# Patient Record
Sex: Male | Born: 1935
Health system: Southern US, Community
[De-identification: ages and names within clinical notes are randomized; demographics above are authoritative.]

## PROBLEM LIST (undated history)

## (undated) DIAGNOSIS — D472 Monoclonal gammopathy: Principal | ICD-10-CM

## (undated) DIAGNOSIS — E785 Hyperlipidemia, unspecified: Secondary | ICD-10-CM

## (undated) DIAGNOSIS — N1832 Chronic kidney disease, stage 3b: Secondary | ICD-10-CM

## (undated) DIAGNOSIS — I4510 Unspecified right bundle-branch block: Secondary | ICD-10-CM

## (undated) DIAGNOSIS — E669 Obesity, unspecified: Secondary | ICD-10-CM

## (undated) DIAGNOSIS — K222 Esophageal obstruction: Secondary | ICD-10-CM

## (undated) DIAGNOSIS — I2510 Atherosclerotic heart disease of native coronary artery without angina pectoris: Secondary | ICD-10-CM

## (undated) DIAGNOSIS — F32A Depression, unspecified: Secondary | ICD-10-CM

## (undated) DIAGNOSIS — R002 Palpitations: Secondary | ICD-10-CM

## (undated) DIAGNOSIS — E1169 Type 2 diabetes mellitus with other specified complication: Secondary | ICD-10-CM

## (undated) DIAGNOSIS — C801 Malignant (primary) neoplasm, unspecified: Secondary | ICD-10-CM

## (undated) HISTORY — PX: APPENDECTOMY: SHX54

## (undated) HISTORY — PX: HERNIA REPAIR: SHX51

## (undated) HISTORY — PX: CATARACT EXTRACTION W/ INTRAOCULAR LENS  IMPLANT, BILATERAL: SHX1307

## (undated) HISTORY — DX: Obesity, unspecified: E66.9

## (undated) HISTORY — DX: Palpitations: R00.2

## (undated) HISTORY — PX: CARDIAC CATHETERIZATION: SHX172

## (undated) HISTORY — DX: Hyperlipidemia, unspecified: E78.5

## (undated) HISTORY — DX: Malignant (primary) neoplasm, unspecified: C80.1

## (undated) HISTORY — DX: Esophageal obstruction: K22.2

## (undated) HISTORY — DX: Obesity, unspecified: E11.69

## (undated) HISTORY — DX: Atherosclerotic heart disease of native coronary artery without angina pectoris: I25.10

## (undated) HISTORY — PX: OTHER SURGICAL HISTORY: SHX169

## (undated) HISTORY — DX: Monoclonal gammopathy: D47.2

## (undated) HISTORY — DX: Unspecified right bundle-branch block: I45.10

## (undated) HISTORY — PX: ANKLE SURGERY: SHX546

---

## 2002-09-10 DIAGNOSIS — C801 Malignant (primary) neoplasm, unspecified: Secondary | ICD-10-CM

## 2002-09-10 HISTORY — DX: Malignant (primary) neoplasm, unspecified: C80.1

## 2002-09-10 HISTORY — PX: PROSTATECTOMY: SHX69

## 2007-06-11 HISTORY — PX: CORONARY ARTERY BYPASS GRAFT: SHX141

## 2007-06-30 ENCOUNTER — Inpatient Hospital Stay (HOSPITAL_COMMUNITY): Admission: EM | Admit: 2007-06-30 | Discharge: 2007-07-07 | Payer: Self-pay | Admitting: Emergency Medicine

## 2007-07-01 ENCOUNTER — Ambulatory Visit: Payer: Self-pay | Admitting: Surgery

## 2007-07-01 ENCOUNTER — Encounter: Payer: Self-pay | Admitting: Cardiothoracic Surgery

## 2007-07-02 ENCOUNTER — Encounter: Payer: Self-pay | Admitting: Cardiothoracic Surgery

## 2007-07-02 ENCOUNTER — Ambulatory Visit: Payer: Self-pay | Admitting: Cardiothoracic Surgery

## 2007-07-24 ENCOUNTER — Encounter: Admission: RE | Admit: 2007-07-24 | Discharge: 2007-07-24 | Payer: Self-pay | Admitting: Cardiothoracic Surgery

## 2007-07-24 ENCOUNTER — Ambulatory Visit: Payer: Self-pay | Admitting: Cardiothoracic Surgery

## 2007-07-24 ENCOUNTER — Encounter (HOSPITAL_COMMUNITY): Admission: RE | Admit: 2007-07-24 | Discharge: 2007-09-10 | Payer: Self-pay | Admitting: Cardiology

## 2007-09-11 ENCOUNTER — Encounter (HOSPITAL_COMMUNITY): Admission: RE | Admit: 2007-09-11 | Discharge: 2007-12-10 | Payer: Self-pay | Admitting: Cardiology

## 2008-03-28 IMAGING — CR DG CHEST 2V
2 series · 2 of 2 positions shown · non-contrast
Comparison: 07/04/07.

CLINICAL DATA: CABG.  Followup.
 CHEST ? 2 VIEW:

[w chest pa]
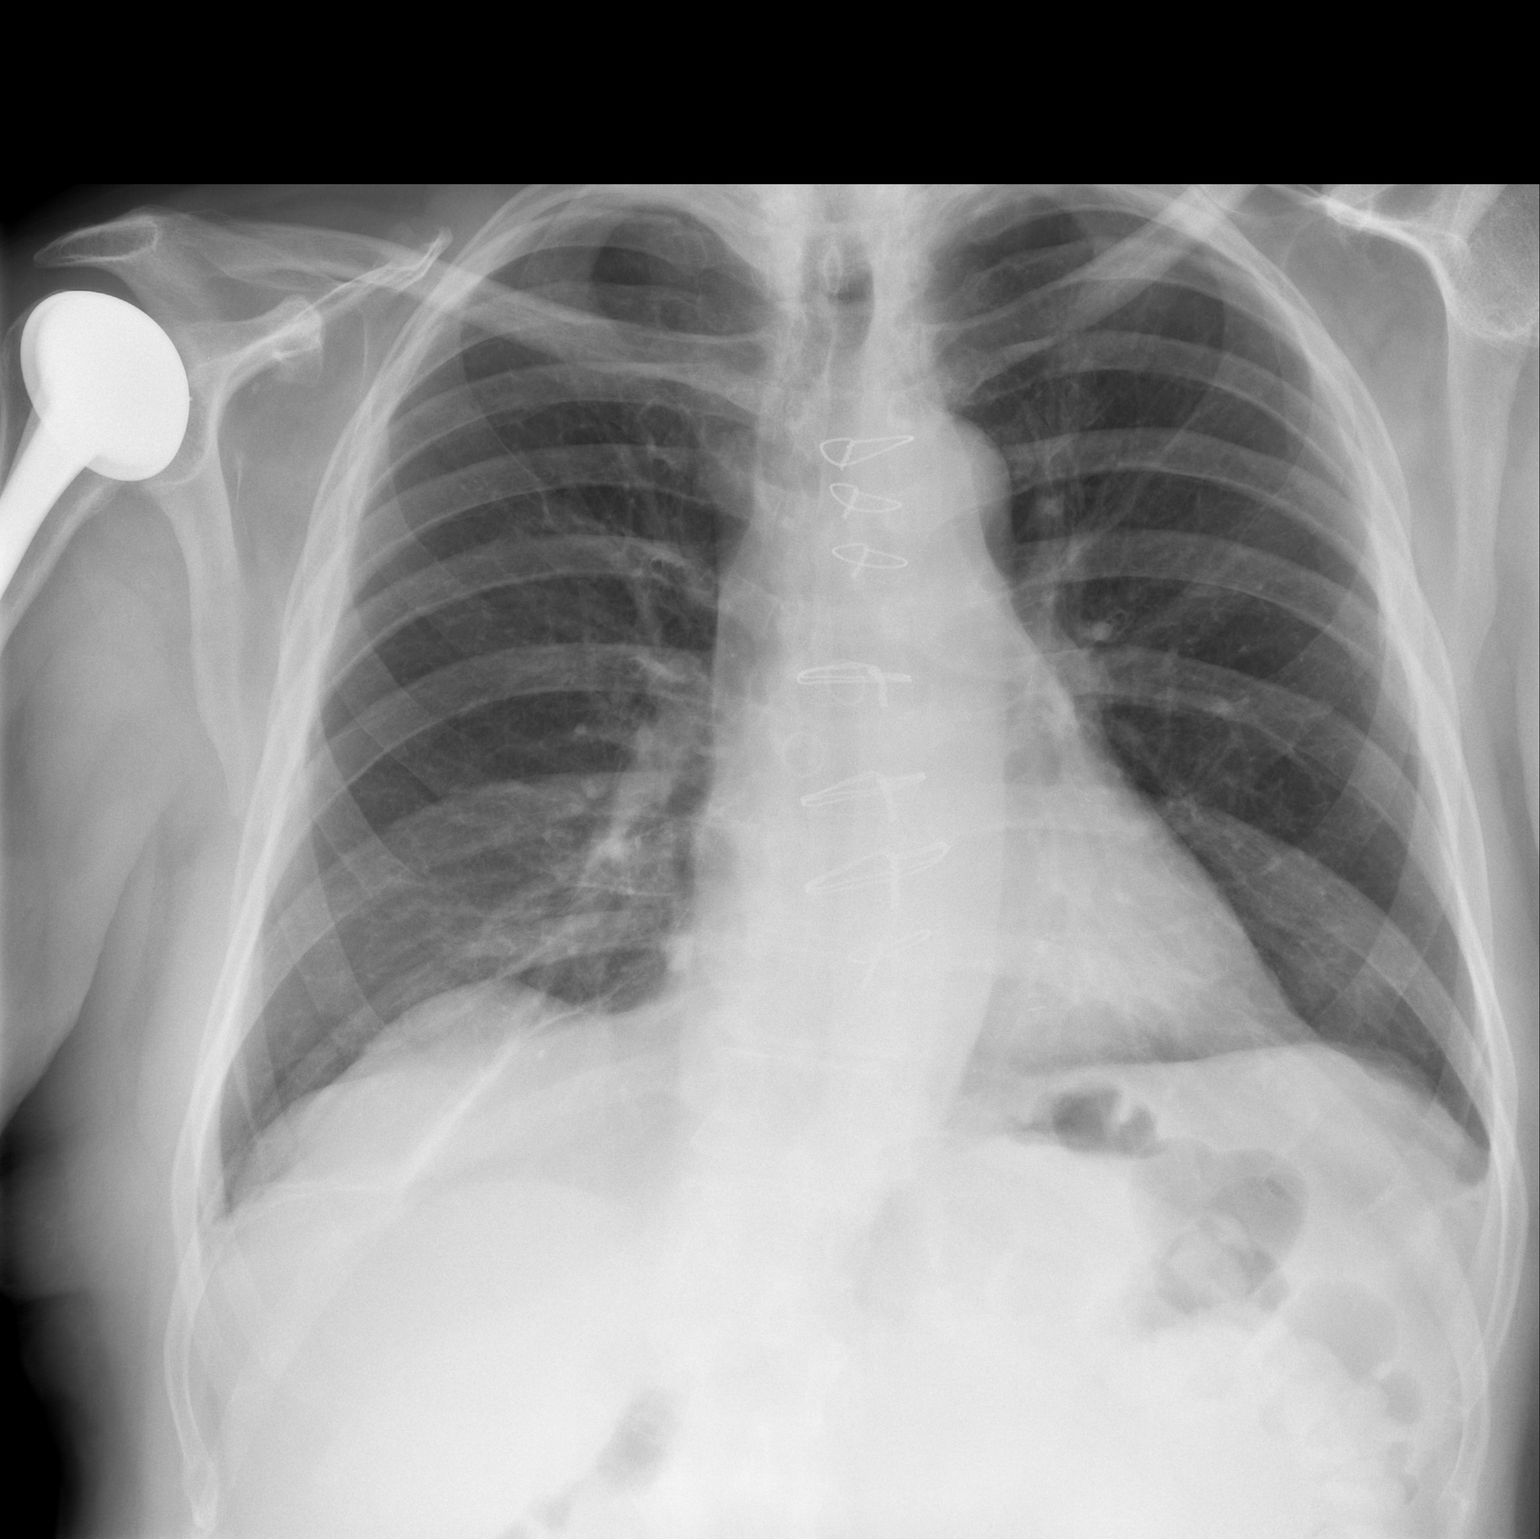

[w chest lat]
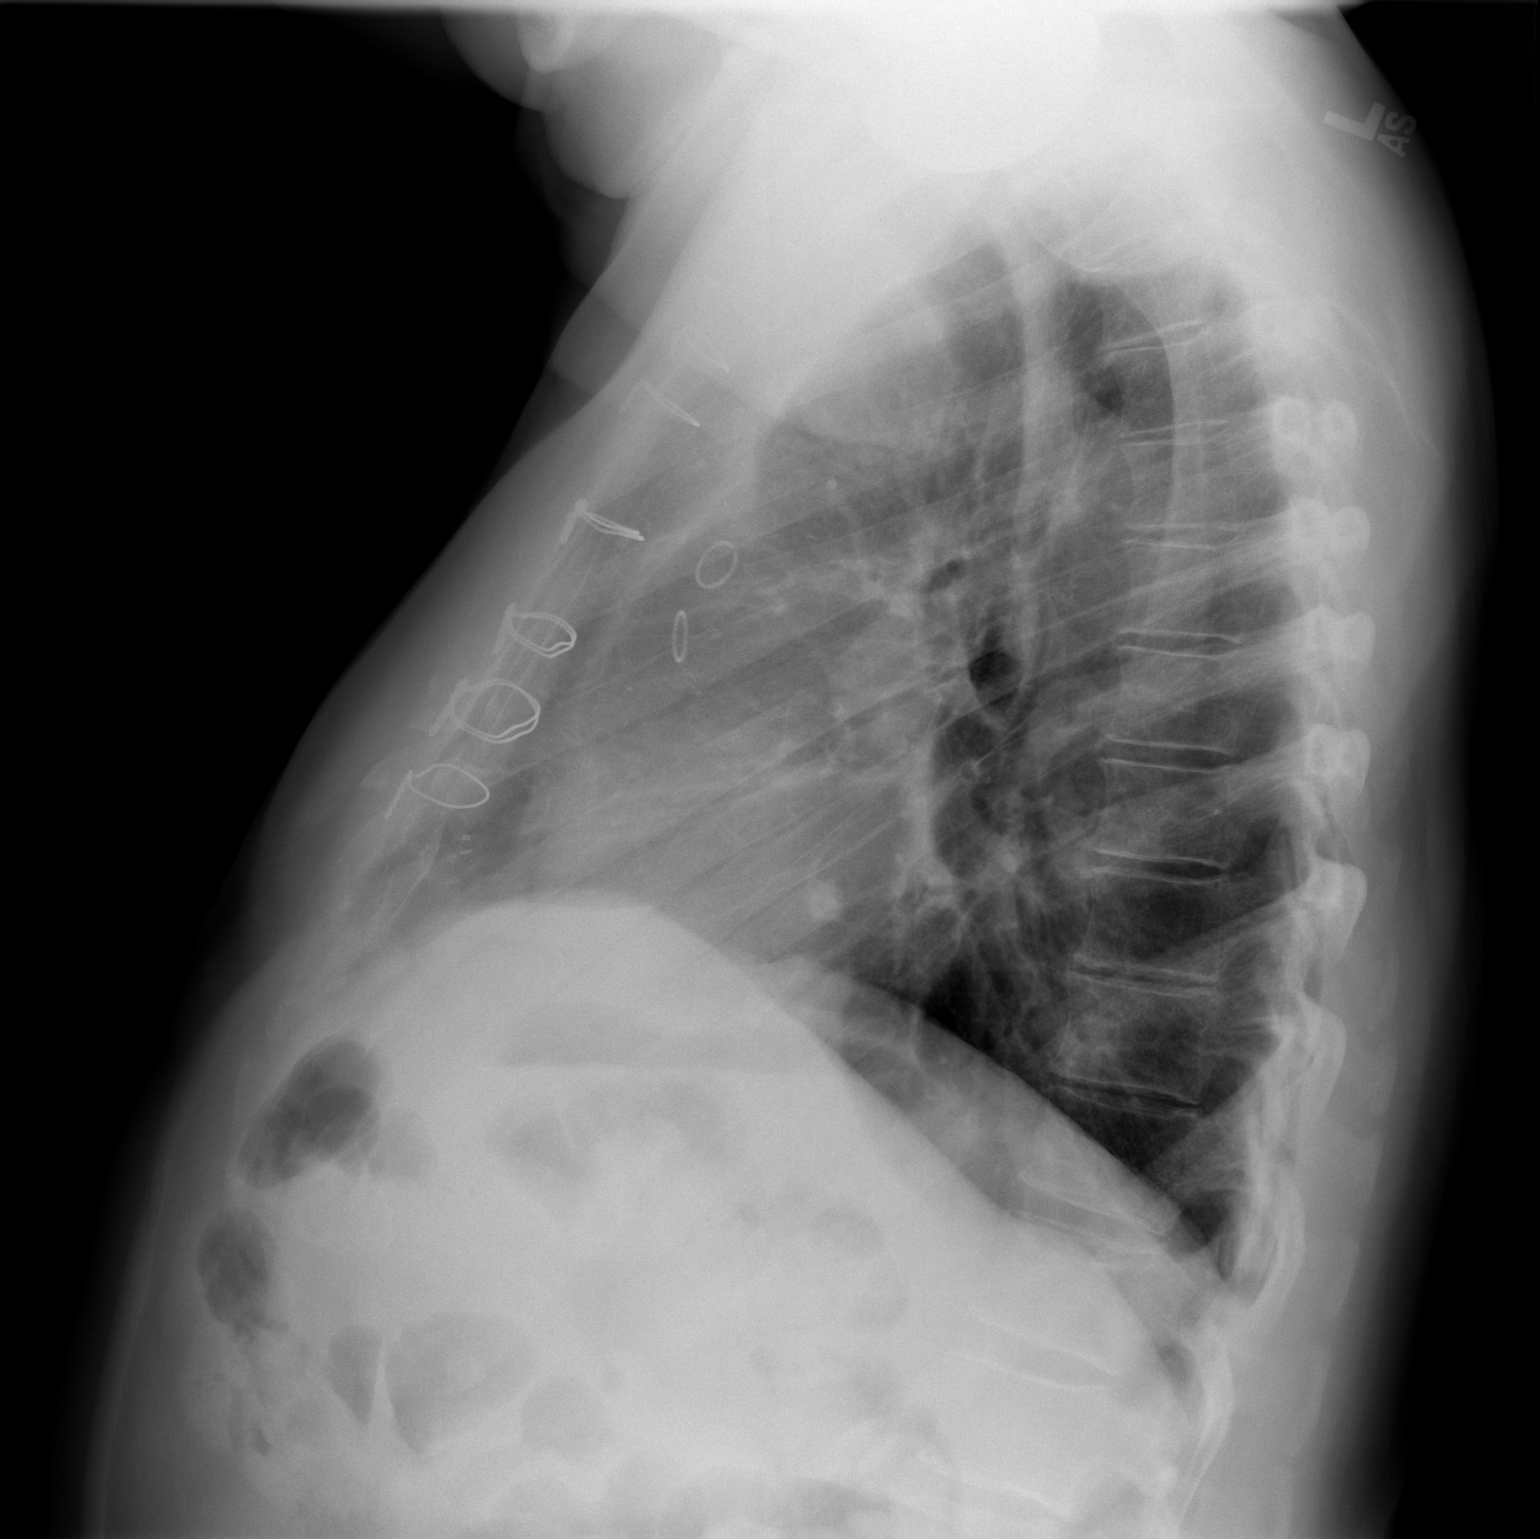

[2 of 2 positions shown; findings below may reference images not displayed]

FINDINGS: Two views of the chest show improved aeration with decreasing basilar linear atelectasis.  The small pleural effusions have resolved.  Granuloma at the medial right lung base is again noted.  Heart size is stable.
IMPRESSION: Improved aeration with decrease in basilar atelectasis and resolution of small effusions.

## 2010-07-07 ENCOUNTER — Ambulatory Visit: Payer: Self-pay | Admitting: Cardiology

## 2010-07-17 ENCOUNTER — Encounter: Payer: Self-pay | Admitting: Cardiology

## 2010-07-17 ENCOUNTER — Ambulatory Visit: Payer: Self-pay

## 2010-07-17 ENCOUNTER — Ambulatory Visit (HOSPITAL_COMMUNITY): Admission: RE | Admit: 2010-07-17 | Discharge: 2010-07-17 | Payer: Self-pay | Admitting: Cardiology

## 2011-01-23 NOTE — Assessment & Plan Note (Signed)
OFFICE VISIT   BASEM, MAISONET  DOB:  10/28/1930                                        July 24, 2007  CHART #:  [id number]   Tony Lee returns to the office today in followup after his coronary  artery bypass grafting x5 done on 10/22.  He is currently at [facility]  and doing extremely well.  He is starting cardiac rehab next week.  He  has had no recurrent angina.  He is increasing his physical activity  appropriately.   On examination, his blood pressure is 135/83.  Pulse is 87.  Respiratory  rate is 18.  O2 sats 98%.  His sternum is stable, well healed.  Lungs  are clear bilaterally.  His endo-vein harvest site and the left leg is  also well healed.  He has no pedal edema.   Followup chest x-ray shows cleared lung fields bilaterally.   He continues on Toprol 50 mg a day, Zocor, and aspirin.  He has normal  LV function and is not on an ACE inhibitor.   Overall, he is pleased with his progress.  He is very anxious to get  started in cardiac rehab.  I have allowed him to return to driving.  I  plan to see him back p.r.n.   Lanelle Bal, MD  Electronically Signed   EG/MEDQ  D:  07/24/2007  T:  07/25/2007  Job:  [id number]   cc:   Haywood Pao, M.D.

## 2011-01-23 NOTE — Consult Note (Signed)
NAME:  Tony Lee, ERISMAN NO.:  [id number]   MEDICAL RECORD NO.:  [id number]          PATIENT TYPE:  INP   LOCATION:  3736                         FACILITY:  [facility]   PHYSICIAN:  Lanelle Bal, MD    DATE OF BIRTH:  06-Oct-1930   DATE OF CONSULTATION:  07/01/2007  DATE OF DISCHARGE:                                 CONSULTATION   FOLLOWUP CARDIOLOGIST:  Dr. Martinique.   PRIMARY CARE PHYSICIAN:  Dr. Osborne Casco   REASON FOR CONSULTATION:  Non-Q-wave myocardial infarction with coronary  occlusive disease, 3 vessel.   HISTORY OF PRESENT ILLNESS:  The patient is a 77 year old male with no  previous history of chest pain who developed acute onset of substernal  and left chest pain radiating into the left arm, with diaphoresis after  doing his usual walk of 2 miles the day before yesterday.  The patient's  pain was finally relieved after he was given nitroglycerine and brought  to the emergency room.  Cardiac markers were done, with a CK of 217 and  MB of 17.6 and troponin of 2.95.   The patient's cardiac history began in 2001 when he was noted on a  flight physical to have arrhythmia.  He has had serial stress Cardiolite  studies that did not show any evidence of ischemia over the years.  He  has had no previous myocardial infarction or angioplasty.  Cardiac risk  factors:  The patient denies hypertension, has a history of  hyperlipidemia for which he is on Simvastatin, denies diabetes, is a  very remote smoker having quit at age 89, has a positive family history.  His father died at age 71 of myocardial infarction.  He has 2 brothers.  Older brother died at age 59 of a myocardial infarction after being  incapacitated by Parkinson's.  His younger brother died at 63 after  having multiple cardiac surgeries including valve replacements on  several occasions and then ultimately coronary artery bypass grafting.  The patient denies renal insufficiency, denies stroke, denies  claudication.   PREVIOUS SURGERY:  Includes prostatectomy for carcinoma of the prostate  12 years ago, currently has no urinary problems, right leg vein  stripping many years ago.  Right shoulder surgery, appendectomy, hernia  repair.   SOCIAL HISTORY:  The patient is married, lives with his wife who has  chronic back problems.  He lives at [place] in an apartment.   MEDICATIONS:  Simvastatin.  The patient has not been on aspirin.   ALLERGIES:  ?VIOXX causing a rash.   CARDIAC REVIEW OF SYSTEMS:  Positive for chest pain, exertional  shortness of breath, diaphoresis, denies orthopnea, presyncope, syncope,  or lower extremity edema.  Also EKGs have showed arrhythmia, the patient  is not aware of palpitations.   GENERAL REVIEW OF SYSTEMS:  The patient denies constitutional symptoms,  fevers, chills or night sweats, denies any respiratory symptoms  chronically until the most recent episode.  Denies hemoptysis.  GI:  Has  had a flexible sigmoidoscopy by Dr. Macky Lower prior to his retirement,  but has no history of colonoscopy being done.  NEUROLOGIC:  Denies  amaurosis or TIAs.  MUSCULOSKELETAL:  Has had surgery on his right shoulder.  GU:  Denies  any problems with urination since his prostatectomy.  Denies any recent  infections.  Denies easy bruisability.  Denies psychiatric history.  Denies claudication.  Other review of systems is negative.   PHYSICAL EXAMINATION:  VITAL SIGNS:  Blood pressure 126/76, heart rate  66, respiratory rate 20, temperature is 97.7.  On room air O2 saturation  is 98%.  GENERAL:  The patient is awake, alert, neurologically intact and able to  relate his history, in no distress.  HEENT:  Pupils are equal, round and reactive to light.  NECK:  Without carotid bruits or jugular venous distention.  LUNGS:  Clear bilaterally.  CARDIAC:  Reveals regular rate and rhythm without murmur or gallop.  ABDOMEN:  Benign, without palpable masses.  Does have right  inguinal  incision from hernia repairs and appendectomy.  EXTREMITIES:  Has multiple short incisions longitudinally across the  right lower leg from vein stripping.  The left leg appears to have  adequate veins.  Venous Doppler studies are pending.   LABORATORY FINDINGS:  Hematocrit is 39.4, white count 8.9, platelet  count 208, creatinine 1.48.   Cardiac catheterization films are reviewed and discussed with Dr.  Martinique.  The patient has irregular disease in the LAD but none greater  than 20%.  A large diagonal branch which goes to the apex and is at  least as large as the LAD has a greater 90% stenosis.  The circumflex  has complex bifurcation lesion at the takeoff of the second obtuse  marginal.  The first obtuse marginal has a 60% lesion.  The main body of  the right coronary artery is slightly aneurysmal with luminal  irregularities.  The distal right coronary artery has approximately 70%  stenosis.  Overall ventricular function is preserved.  With the complex  3-vessel coronary artery disease, coronary artery bypass grafting is  recommended to the patient.  The risks of surgery, including death,  infection, stroke, myocardial infarction, bleeding, and blood  transfusion are all discussed with the patient in detail.  He is  agreeable with proceeding.  We will make plans to proceed with surgery  tomorrow, October 22.      Lanelle Bal, MD  Electronically Signed     EG/MEDQ  D:  07/01/2007  T:  07/01/2007  Job:  [id number]   cc:   Peter M. Martinique, M.D.  Haywood Pao, M.D.

## 2011-01-23 NOTE — Discharge Summary (Signed)
NAME:  Tony Lee, Tony Lee NO.:  [id number]   MEDICAL RECORD NO.:  [id number]          PATIENT TYPE:  INP   LOCATION:  2015                         FACILITY:  [facility]   PHYSICIAN:  Lanelle Bal, MD    DATE OF BIRTH:  Jan 20, 1931   DATE OF ADMISSION:  06/30/2007  DATE OF DISCHARGE:  07/07/2007                               DISCHARGE SUMMARY   This is present on a.c. CBCs by Dr. Madolyn Frieze on the patient's on his D G  O M A aspirate W O R I G H T the birth of [birth year] MR number  [id number]   FINAL DIAGNOSIS:  Non-Q-wave myocardial infarction with coronary  occlusive disease, three-vessel.   IN-HOSPITAL DIAGNOSES:  1. Acute renal insufficiency postoperatively.  2. Volume overload postoperatively.  3. Acute blood loss anemia postoperatively.   SECONDARY DIAGNOSES:  1. History of coronary artery disease beginning in 2001.  2. Hyperlipidemia.  3. Remote tobacco abuse.  4. Status post prostatectomy for carcinoma of the prostate 12 years      ago.  5. Status post right leg vein stripping.  6. Status post right chest shoulder surgery.  7. Status post appendectomy.  8. Status post hernia repair.   IN-HOSPITAL OPERATIONS AND PROCEDURES:  1. Cardiac catheterization.  2. Coronary artery bypass grafting x5 using a left internal mammary      artery to diagonal branch, saphenous vein graft to distal right      coronary artery post takeoff of PDA.  3. Saphenous vein graft sequential to OM-1, OM-2, OM-3.   HISTORY AND PHYSICAL AND HOSPITAL COURSE:  The patient is a 7-year male  who developed acute onset of substernal left chest pain radiating to the  left arm with diaphoresis after doing his usual walk of two miles.  The  patient's pain was finally relieved after given nitroglycerin and  brought to the emergency room.  The cardiac enzymes were done showing a  CK at 217 and MB of 17.6, with troponin 2.95.  The patient does have  history of hyperlipidemia.  He was taken  for cardiac catheterization  June 30, 2007, showing severe occlusive three-vessel coronary artery  disease.  Following catheterization, the patient was seen and evaluated  by Dr. Servando Snare.  Dr. Servando Snare discussed with the patient undergoing  coronary artery bypass grafting.  He discussed risks and benefits with  the patient.  The patient acknowledged understanding and agreed to  proceed.  Surgery was scheduled for July 02, 2007.  Preoperatively,  the patient underwent bilateral carotid Duplex ultrasound showing no  significant ICA stenosis.  The patient remained stable preoperatively.  For details of the patient's past medical history and physical exam,  please see dictated H&P.   The patient was taken to the operating room July 02, 2007, where he  underwent coronary bypass grafting x5 using a left internal mammary  artery to diagonal, saphenous vein graft to distal right coronary artery  post takeoff of the PDA, saphenous vein graft sequential to OM-1, OM-2,  OM-3.  The patient tolerated this procedure well and was transferred to  the intensive care unit in stable condition.  Postoperatively, the  patient was noted to be hemodynamically stable.  He was extubated the  evening of surgery.  Post extubation, the patient noted to be alert and  oriented x4.  Neuro intact.  The patient's postoperative course was  pretty much unremarkable.  Postop day #1 on the intensive care unit, the  patient's vital signs were stable.  He was weaned from all drips.  The  patient's Swan-Ganz catheter was discontinued in normal fashion.  Chest  x-ray postoperatively was clear.  He had minimum drainage from chest  tubes and chest tubes discontinued in normal fashion.  He did have  slight acute renal insufficiency with creatinine being slightly elevated  1.6.  Baseline is 1.5.  This was followed closely.  The patient also was  noted to have slight volume overload postoperatively, started on  diuretics.   He was out of bed ambulating with cardiac rehab and felt  stable to be transferred out to 2000 postop day #1.  While on telemetry  floor, the patient continued to progress well.  His vital signs  continued to be monitored.  He did develop low grade fever with  leukocytosis postop day #2.  No signs of infection noted and UA and  culture obtained.  These were negative.  The patient's fever resolved  the same day.  The white blood cell count was repeated and noted to be  trending down back to within normal limits prior to discharge.  He was  able to be weaned off oxygen, sating greater than 90% on room air.  The  patient's blood pressure and heart rate were followed.  He was slightly  hypertensive with sinus tachycardia.  Beta blocker was increased  appropriately and these improved.  Vital signs were continued to be  monitored prior to discharge home.  The patient had obtained daily  weights.  He was continued on diuretics.  The patient was back near his  baseline weight prior to discharge home.  He was 0.2 kg above his  preoperative weight prior to discharge.  The patient's creatinine was  continued to be monitored.  Prior to discharge home, it went back to  baseline and was 1.46.  Postoperatively, the patient remained in normal  sinus rhythm.  His pulmonary status continued to improve.  Incisions  were all clean, dry and intact and healing well.  He continued to  ambulate well with cardiac rehab and then independently prior to  discharge.  He was tolerating diet well.  No nausea or vomiting noted.  Prior to discharge, case management has been consulted.  The patient is  a resident of [facility].  It was anticipated the  patient might go to rehab prior to discharge home to increase in  strength.  The patient's wife does live with him at home.  Plan was for  the patient to go to rehab prior to [facility], but prior to discharge  no bed is available and patient was  progressing well and did not feel  would require further rehab.  Plan was for discharge to home with wife.   LABORATORY DATA:  Labs on July 05, 2007, showed a white count 11.3,  hemoglobin 11.4, hematocrit 33.8, platelet count 157.  Sodium was 136,  potassium 3.5, chloride of 99, bicarbonate of 27, BUN 15, creatinine of  1.46 with glucose of 110.   The patient was felt to be stable and ready for discharge home  July 07, 2007, postop day #5.   FOLLOW-UP APPOINTMENTS:  Follow-up appointment was arranged with Dr.  Servando Snare for July 24, 2007, at 4:30 p.m..  The patient will need to  obtain PA and lateral chest x-ray 30 minutes prior to this appointment.  The patient will need to follow up with Dr. Martinique in two weeks.  He  needs to contact Dr. [name] Sou office to make these arrangements.   ACTIVITY:  Patient instructed no driving until released to do so, no  heavy lifting over 10 pounds.  He is told to ambulate three to four  times per day, progress as tolerated and continue his breathing  exercises.   INCISIONAL CARE:  The patient is told to shower, washing his incisions  using soap and water.  He is to contact the office if he develops any  drainage or opening from any of his incision sites.   DIET:  The patient educated on diet to be low-fat, low-salt.   DISCHARGE MEDICATIONS:  1. Aspirin 325 mg daily.  2. Lopressor 75 mg b.i.d.  3. Zocor 10 mg at night.  4. Lasix 40 mg daily x5 days.  5. Potassium chloride 20 mEq daily x5 days.  6. Tylenol 500 mg one to two tablets q.4-6h. p.r.n. pain.  7. Oxycodone 5 mg one to two tablets q.4-6h. p.r.n. pain.      Darlin Coco, [place]      Lanelle Bal, MD  Electronically Signed    KMD/MEDQ  D:  07/07/2007  T:  07/07/2007  Job:  [id number]   cc:   Peter M. Martinique, M.D.

## 2011-01-23 NOTE — Cardiovascular Report (Signed)
NAME:  Tony, Lee NO.:  [id number]   MEDICAL RECORD NO.:  [id number]          PATIENT TYPE:  INP   LOCATION:  34                         FACILITY:  [facility]   PHYSICIAN:  Peter M. Martinique, M.D.  DATE OF BIRTH:  1930-10-07   DATE OF PROCEDURE:  DATE OF DISCHARGE:                            CARDIAC CATHETERIZATION   INDICATIONS FOR PROCEDURE:  This is a 77 year old white male who has a  strong family history of coronary disease and has a history of  hypercholesterolemia presents with unstable angina symptoms this morning   PROCEDURE:  Left heart catheterization, coronary and left ventricular  angiography, access via the right femoral artery using standard  Seldinger technique.   EQUIPMENT:  A 6-French 4 cm right and left Judkins catheter, 6-French  pigtail catheter, 6-French arterial sheath   MEDICATIONS:  Local anesthesia with 1% Xylocaine, contrast 130 mL of  Omnipaque.   HEMODYNAMIC DATA:  Aortic pressure was 111/53 with a mean of 80 mmHg,  left ventricle pressure was 103 with an EDP of 13 mmHg.   ANGIOGRAPHIC DATA:  The left coronary artery arises and distributes  normally.  Left main coronary artery is normal.   The left anterior descending artery is mildly calcified in the proximal  vessel.  There is 30% stenosis proximally.  There is another 30% focal  lesion in the midvessel.   There is a large diagonal branch.  This has a 90% stenosis proximally  with a segmental stenosis in the plaque extending up into the ostium of  the diagonal.   The left circumflex coronary gives rise to three obtuse marginal vessels  which were moderate in size.  There is a 90% bifurcation stenosis at the  bifurcation of the second and third obtuse marginal vessels.   The right coronary is a very large dominant vessel.  It has diffuse 30-  40% disease in the proximal to mid vessel.  In the distal vessel there  is segmental 70% stenosis prior to the takeoff the PDA and a  large  posterolateral branch.   The left ventricular angiography was performed in RAO view.  This  demonstrates normal left ventricular size and contractility with normal  systolic function.  Ejection fraction is estimated at 60%.  There is no  mitral regurgitation or prolapse.   FINAL INTERPRETATION:  1. Three-vessel obstructive atherosclerotic coronary artery disease.  2. Normal left ventricular function.   PLAN:  Would consider for coronary bypass surgery for complete  revascularization.  The diagonal branches would be difficult to stand  given its involvement of the ostium and the bifurcation lesion of the  circumflex would also be very difficult to treat percutaneously.           ______________________________  Peter M. Martinique, M.D.     PMJ/MEDQ  D:  06/30/2007  T:  07/01/2007  Job:  [id number]   cc:   [facility]  Haywood Pao, M.D.

## 2011-01-23 NOTE — Op Note (Signed)
NAME:  Tony Lee, Tony Lee NO.:  [id number]   MEDICAL RECORD NO.:  [id number]          PATIENT TYPE:  INP   LOCATION:  2015                         FACILITY:  [facility]   PHYSICIAN:  Lanelle Bal, MD    DATE OF BIRTH:  [birth year]   DATE OF PROCEDURE:  07/02/2007  DATE OF DISCHARGE:  07/07/2007                               OPERATIVE REPORT   PREOPERATIVE DIAGNOSIS:  Coronary occlusive disease.   POSTOPERATIVE DIAGNOSIS:  Coronary occlusive disease.   SURGICAL PROCEDURES:  Coronary artery bypass grafting x5 with the left  internal mammary to the diagonal coronary artery, sequential reverse  saphenous vein graft to the first obtuse marginal, second obtuse  marginal and third obtuse marginal, reverse saphenous vein graft to the  right coronary artery with left endovein harvesting and left pleural  biopsy.   SURGEON:  Lanelle Bal, MD   FIRST ASSISTANT:  Revonda Standard. Roxan Hockey, M.D.   BRIEF HISTORY:  The patient is 77 year old male admitted by Dr. Martinique  because of the new onset of anginal symptoms.  He underwent cardiac  catheterization which demonstrated severe three-vessel disease.  He had  a very large diagonal vessel with 80-90% stenosis, LAD that was  relatively spared.  The diagonal was almost as big as the LAD.  Also had  diffuse disease throughout the circumflex system with three obtuse  marginals of reasonable size and disease in the proximal right coronary  artery.  Coronary artery bypass grafting was recommended to the patient  who agreed and signed consent.   DESCRIPTION OF PROCEDURE:  With Swan-Ganz and arterial line monitors in  place, the patient underwent general endotracheal anesthesia without  incident.  The skin of the chest and legs were prepped with Betadine and  draped in the usual sterile manner.  Using the [employer] endovein  harvesting system, vein was harvested from the left thigh and was of  adequate quality and caliber.  Median  sternotomy was performed.  Left  internal mammary artery was dissected down as pedicle graft.  The distal  artery was divided and had good free flow.  Pericardium was opened.  Overall ventricular function appeared preserved.  The patient was  systemically heparinized.  The  ascending aorta and the right atrium  were cannulated.  During the dissection down of the mammary, there was  some pleural plaques that appeared slightly unusual.  Portion of these  were sent for biopsy without frozen section.  The patient was  systemically heparinized.  Ascending aorta was cannulated.  The right  atrium was cannulated in aortic root.  Bent cardioplegia needle was  introduced into the ascending aorta.  The patient was placed on  cardiopulmonary bypass 2.4 liters per minute per meter squared.  Sites  of anastomosis were selected and dissected out of the epicardium.  Patient body temperature was cooled to 32 degrees.  Aortic crossclamp  was applied and 500 mL of cold blood potassium cardioplegia was  administered with rapid diastolic arrest of heart.  Myocardial and  septal temperatures monitored throughout the crossclamp period.  Attention was turned first to the triple sequential  obtuse 1, 2 and 3  vessel.  Each of these vessels were opened and admitted a 1.5 mm probe.  The second obtuse marginal was then anastomosed first with a side-to-  side anastomosis.  Distal extent of same vein was then trimmed to  appropriate length and distal anastomosis were performed to the third  obtuse marginal.  A side-to-side anastomosis was then created to the  first obtuse marginal.  Additional cold blood cardioplegia was  administered down the completed vein graft.  Attention was then turned  to the right coronary artery.  The distal right coronary artery was  diseased just past the takeoff of the posterior descending.  The vessel  was slightly thinner and appeared less diseased, was opened and was of  good quality.   Using a running 7-0 Prolene distal anastomosis was  performed with segment of reversed saphenous vein graft.  Attention was  then turned to the large diagonal.  The mammary was purposely put to the  diagonal artery as the LAD was relatively spared and the diagonal was a  tightly diseased vessel and was a very large vessel, opened and easily  admitted a 1.5 mm probe.  Using a running 8-0 Prolene, the left internal  mammary artery was anastomosed to the diagonal coronary artery.  The  bulldog was temporarily removed at the completion of anastomosis.  There  was rise in myocardial septal temperature.  Bulldog was placed back on  the mammary artery.  With an aortic crossclamp still in place two punch  aortotomies were performed in the ascending aorta, air was evacuated.  The two punch aortotomies performed in the ascending aorta.  Each of the  two vein grafts were anastomosed to the ascending aorta.  Air was  evacuated from the grafts and the ascending aorta.  Aortic crossclamp  was removed with total crossclamp time of 82 minutes.  The patient  spontaneously converted to a sinus rhythm.  Sites of anastomosis were  inspected, were free of bleeding.  The patient was then ventilated,  weaned cardiopulmonary bypass without difficulty.  He remained  hemodynamically stable, was decannulated in usual fashion.  Protamine  sulfate was administered.  With operative field hemostatic, two  ventricular pacing wires were applied.  Graft markers were applied and a  left pleural tube and Blake mediastinal drain were left in place.  Pericardium was reapproximated.  Sternum closed with #6 stainless steel  wire.  Fascia closed with interrupted 0 Vicryl, running 3-0 Vicryl in  subcutaneous tissue and 4-0 subcuticular stitch in skin edges.  Dry  dressings were applied.  Sponge and needle count was reported as correct  at completion of procedure.  The patient tolerated the procedure without  obvious complication  and was transferred to surgical intensive care unit  for further postoperative care.      Lanelle Bal, MD  Electronically Signed     EG/MEDQ  D:  07/08/2007  T:  07/09/2007  Job:  [id number]   cc:   Peter M. Martinique, M.D.
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
## 2011-02-03 ENCOUNTER — Other Ambulatory Visit: Payer: Self-pay | Admitting: Cardiology

## 2011-02-06 NOTE — Telephone Encounter (Signed)
escribe medication per fax request  

## 2011-05-16 ENCOUNTER — Encounter: Payer: Self-pay | Admitting: *Deleted

## 2011-05-16 DIAGNOSIS — E785 Hyperlipidemia, unspecified: Secondary | ICD-10-CM | POA: Insufficient documentation

## 2011-05-16 DIAGNOSIS — I4510 Unspecified right bundle-branch block: Secondary | ICD-10-CM | POA: Insufficient documentation

## 2011-05-16 DIAGNOSIS — C801 Malignant (primary) neoplasm, unspecified: Secondary | ICD-10-CM | POA: Insufficient documentation

## 2011-05-16 DIAGNOSIS — R002 Palpitations: Secondary | ICD-10-CM | POA: Insufficient documentation

## 2011-05-30 ENCOUNTER — Ambulatory Visit (INDEPENDENT_AMBULATORY_CARE_PROVIDER_SITE_OTHER): Payer: Medicare Other | Admitting: Cardiology

## 2011-05-30 ENCOUNTER — Encounter: Payer: Self-pay | Admitting: Cardiology

## 2011-05-30 DIAGNOSIS — I2510 Atherosclerotic heart disease of native coronary artery without angina pectoris: Secondary | ICD-10-CM | POA: Insufficient documentation

## 2011-05-30 DIAGNOSIS — E785 Hyperlipidemia, unspecified: Secondary | ICD-10-CM

## 2011-05-30 NOTE — Assessment & Plan Note (Signed)
He remains on a combination of Zocor and trlipix. He is scheduled for fasting lab work with his primary physician in one month. We will await these results.

## 2011-05-30 NOTE — Assessment & Plan Note (Signed)
He is now 4 years out from his bypass surgery. He remains asymptomatic. He did have an echocardiogram one year ago which was normal. We will followup again in one year and anticipate doing a followup stress test at year 5.

## 2011-05-30 NOTE — Patient Instructions (Addendum)
Continue your current medications.  Continue your exercise program  I will see you again in 1 year.

## 2011-05-30 NOTE — Progress Notes (Signed)
Tony Lee Date of Birth: 09-21-1930   History of Present Illness: Tony Lee is seen today for followup. He states he has done. Well over the past year. He has had no significant chest pain or shortness of breath. He continues to exercise at the gym 6 days a week. He has had no other new medical problems. The only symptom he notices occasional sensation that his heart turns over or skips. He has had no dizziness or syncope. He has a history of coronary disease and is status post coronary bypass surgery in October of 2008. This included an LIMA graft to the diagonal, sequential saphenous vein graft to the first, second, and third obtuse marginal vessels, and saphenous vein graft to the right coronary.  Current Outpatient Prescriptions on File Prior to Visit  Medication Sig Dispense Refill  . aspirin 325 MG tablet Take 325 mg by mouth daily.        . Choline Fenofibrate 135 MG capsule Take 135 mg by mouth daily.        . metoprolol tartrate (LOPRESSOR) 25 MG tablet TAKE 1 TABLET BY MOUTH EVERY DAY  30 tablet  5  . nitroGLYCERIN (NITROSTAT) 0.4 MG SL tablet Place 0.4 mg under the tongue every 5 (five) minutes as needed.        . simvastatin (ZOCOR) 20 MG tablet Take 20 mg by mouth at bedtime.          Allergies  Allergen Reactions  . Vioxx (Rofecoxib)     Past Medical History  Diagnosis Date  . Coronary artery disease   . Cancer     prostate  . RBBB (right bundle branch block)   . Palpitations   . Dyslipidemia   . Esophageal stricture     Past Surgical History  Procedure Date  . Right shoulder      replacement  . Cardiac catheterization     Dr. Martinique  . Coronary artery bypass graft Oct. 2008    x 5,Dr.Gerhardt  . Ankle surgery   . Vein strippping   . Other surgical history     appendectomy  . Hernia repair   . Prostatectomy     radical    History  Smoking status  . Former Smoker  Smokeless tobacco  . Not on file    History  Alcohol Use     Family  History  Problem Relation Age of Onset  . Heart failure Father     age 53  . Heart attack Brother     age 61 post third open heart surgery  . Heart attack Brother     Review of Systems: As noted in history of present illness..  All other systems were reviewed and are negative.  Physical Exam: BP 122/70  Pulse 60  Ht 5\' 10"  (1.778 m)  Wt 212 lb 6.4 oz (96.344 kg)  BMI 30.48 kg/m2 The patient is alert and oriented x 3.  The mood and affect are normal.  The skin is warm and dry.  Color is normal.  The HEENT exam reveals that the sclera are nonicteric.  The mucous membranes are moist.  The carotids are 2+ without bruits.  There is no thyromegaly.  There is no JVD.  The lungs are clear.  The chest wall is non tender. He has a median sternotomy scar. The heart exam reveals a regular rate with a normal S1 and S2.  There are no murmurs, gallops, or rubs.  The PMI is not  displaced.   Abdominal exam reveals good bowel sounds.  There is no guarding or rebound.  There is no hepatosplenomegaly or tenderness.  There are no masses.  Exam of the legs reveal no clubbing, cyanosis, or edema.  The legs are without rashes.  The distal pulses are intact.  Cranial nerves II - XII are intact.  Motor and sensory functions are intact.  The gait is normal.  LABORATORY DATA:   Assessment / Plan:

## 2011-06-20 LAB — POCT I-STAT 3, ART BLOOD GAS (G3+)
Acid-base deficit: 2
Bicarbonate: 22.5
Bicarbonate: 25 — ABNORMAL HIGH
Bicarbonate: 25.4 — ABNORMAL HIGH
O2 Saturation: 100
Operator id: 299391
Patient temperature: 35.5
Patient temperature: 36.7
TCO2: 24
pCO2 arterial: 41.9
pH, Arterial: 7.397
pO2, Arterial: 277 — ABNORMAL HIGH
pO2, Arterial: 98

## 2011-06-20 LAB — CBC
HCT: 30 — ABNORMAL LOW
HCT: 30.5 — ABNORMAL LOW
HCT: 34.5 — ABNORMAL LOW
HCT: 37.8 — ABNORMAL LOW
HCT: 39.4
Hemoglobin: 10.4 — ABNORMAL LOW
Hemoglobin: 10.5 — ABNORMAL LOW
Hemoglobin: 11.4 — ABNORMAL LOW
Hemoglobin: 11.9 — ABNORMAL LOW
Hemoglobin: 12.9 — ABNORMAL LOW
Hemoglobin: 13.7
MCHC: 33.8
MCHC: 34.2
MCHC: 34.4
MCHC: 34.5
MCHC: 34.5
MCV: 93.2
MCV: 93.9
MCV: 94
MCV: 94.3
MCV: 94.7
MCV: 95
MCV: 95.5
Platelets: 128 — ABNORMAL LOW
Platelets: 131 — ABNORMAL LOW
Platelets: 151
Platelets: 164
Platelets: 208
Platelets: 226
RBC: 3.2 — ABNORMAL LOW
RBC: 3.22 — ABNORMAL LOW
RBC: 3.57 — ABNORMAL LOW
RBC: 3.7 — ABNORMAL LOW
RBC: 3.97 — ABNORMAL LOW
RBC: 4.22
RDW: 12.3
RDW: 12.4
RDW: 12.4
RDW: 12.4
RDW: 12.4
RDW: 12.6
WBC: 12.3 — ABNORMAL HIGH
WBC: 15.3 — ABNORMAL HIGH
WBC: 6.7
WBC: 7.4
WBC: 8.8
WBC: 8.9
WBC: 9.4

## 2011-06-20 LAB — BASIC METABOLIC PANEL
BUN: 11
BUN: 12
CO2: 24
CO2: 27
CO2: 27
Calcium: 7.5 — ABNORMAL LOW
Calcium: 7.9 — ABNORMAL LOW
Calcium: 8.4
Calcium: 8.5
Chloride: 100
Chloride: 107
Creatinine, Ser: 1.46
Creatinine, Ser: 1.48
Creatinine, Ser: 1.54 — ABNORMAL HIGH
Creatinine, Ser: 1.61 — ABNORMAL HIGH
GFR calc Af Amer: 51 — ABNORMAL LOW
GFR calc Af Amer: 54 — ABNORMAL LOW
GFR calc Af Amer: 56 — ABNORMAL LOW
GFR calc Af Amer: 57 — ABNORMAL LOW
GFR calc non Af Amer: 44 — ABNORMAL LOW
GFR calc non Af Amer: 47 — ABNORMAL LOW
Glucose, Bld: 151 — ABNORMAL HIGH
Potassium: 3.5
Potassium: 3.9
Sodium: 135
Sodium: 136
Sodium: 139

## 2011-06-20 LAB — URINALYSIS, MICROSCOPIC ONLY
Bilirubin Urine: NEGATIVE
Hgb urine dipstick: NEGATIVE
Nitrite: NEGATIVE
Protein, ur: NEGATIVE
Specific Gravity, Urine: 1.013
Urobilinogen, UA: 0.2

## 2011-06-20 LAB — URINALYSIS, ROUTINE W REFLEX MICROSCOPIC
Bilirubin Urine: NEGATIVE
Glucose, UA: NEGATIVE
Ketones, ur: NEGATIVE
Nitrite: NEGATIVE
Specific Gravity, Urine: 1.015
pH: 5.5

## 2011-06-20 LAB — DIFFERENTIAL
Basophils Absolute: 0
Eosinophils Absolute: 0.1
Eosinophils Relative: 2
Monocytes Relative: 7
Neutro Abs: 4.9

## 2011-06-20 LAB — I-STAT EC8
BUN: 13
Chloride: 104
HCT: 34 — ABNORMAL LOW
Hemoglobin: 11.6 — ABNORMAL LOW
Operator id: 299391
Potassium: 4.3
Sodium: 140
pCO2 arterial: 38.5

## 2011-06-20 LAB — COMPREHENSIVE METABOLIC PANEL
ALT: 17
AST: 31
Albumin: 3.6
Alkaline Phosphatase: 59
CO2: 25
Chloride: 101
Creatinine, Ser: 1.53 — ABNORMAL HIGH
GFR calc Af Amer: 54 — ABNORMAL LOW
Potassium: 3.8
Sodium: 133 — ABNORMAL LOW
Total Bilirubin: 0.8

## 2011-06-20 LAB — POCT I-STAT 4, (NA,K, GLUC, HGB,HCT)
Glucose, Bld: 117 — ABNORMAL HIGH
Glucose, Bld: 92
Glucose, Bld: 99
HCT: 24 — ABNORMAL LOW
HCT: 36 — ABNORMAL LOW
HCT: 36 — ABNORMAL LOW
Hemoglobin: 12.2 — ABNORMAL LOW
Hemoglobin: 8.2 — ABNORMAL LOW
Hemoglobin: 8.8 — ABNORMAL LOW
Operator id: 274841
Operator id: 3406
Potassium: 4.1
Potassium: 4.1
Potassium: 4.1
Sodium: 132 — ABNORMAL LOW

## 2011-06-20 LAB — URINE MICROSCOPIC-ADD ON

## 2011-06-20 LAB — I-STAT 8, (EC8 V) (CONVERTED LAB)
BUN: 21
Bicarbonate: 22.6
Operator id: 151321
Sodium: 139

## 2011-06-20 LAB — PROTIME-INR
INR: 0.9
INR: 1
INR: 1.4
Prothrombin Time: 12.2
Prothrombin Time: 17.7 — ABNORMAL HIGH

## 2011-06-20 LAB — APTT
aPTT: 24
aPTT: 34

## 2011-06-20 LAB — URINE CULTURE
Colony Count: NO GROWTH
Culture: NO GROWTH

## 2011-06-20 LAB — TROPONIN I
Troponin I: 0.2 — ABNORMAL HIGH
Troponin I: 1.67

## 2011-06-20 LAB — BLOOD GAS, ARTERIAL
Acid-Base Excess: 0.5
Bicarbonate: 24
TCO2: 25.1
pCO2 arterial: 34.1 — ABNORMAL LOW
pH, Arterial: 7.46 — ABNORMAL HIGH
pO2, Arterial: 95.4

## 2011-06-20 LAB — CREATININE, SERUM
Creatinine, Ser: 1.5
GFR calc Af Amer: 55 — ABNORMAL LOW
GFR calc non Af Amer: 46 — ABNORMAL LOW

## 2011-06-20 LAB — CARDIAC PANEL(CRET KIN+CKTOT+MB+TROPI): Total CK: 217

## 2011-06-20 LAB — HEPARIN LEVEL (UNFRACTIONATED)
Heparin Unfractionated: 0.53
Heparin Unfractionated: 0.77 — ABNORMAL HIGH

## 2011-06-20 LAB — CK TOTAL AND CKMB (NOT AT ARMC)
CK, MB: 10.4 — ABNORMAL HIGH
CK, MB: 15 — ABNORMAL HIGH
Relative Index: 8.1 — ABNORMAL HIGH
Total CK: 186
Total CK: 199

## 2011-06-20 LAB — MAGNESIUM: Magnesium: 2.5

## 2011-06-20 LAB — HEMOGLOBIN AND HEMATOCRIT, BLOOD
HCT: 25.3 — ABNORMAL LOW
Hemoglobin: 8.7 — ABNORMAL LOW

## 2011-06-20 LAB — POCT I-STAT CREATININE: Creatinine, Ser: 1.5

## 2011-06-20 LAB — POCT CARDIAC MARKERS
CKMB, poc: 1.7
Myoglobin, poc: 119

## 2011-06-20 LAB — LIPID PANEL: LDL Cholesterol: 58

## 2011-06-20 LAB — TYPE AND SCREEN: Antibody Screen: NEGATIVE

## 2011-08-10 ENCOUNTER — Other Ambulatory Visit: Payer: Self-pay | Admitting: Cardiology

## 2011-11-05 ENCOUNTER — Other Ambulatory Visit: Payer: Self-pay | Admitting: Gastroenterology

## 2012-06-04 ENCOUNTER — Other Ambulatory Visit: Payer: Self-pay

## 2012-06-04 MED ORDER — NITROGLYCERIN 0.4 MG SL SUBL: 0.4000 mg | SUBLINGUAL_TABLET | SUBLINGUAL | Status: DC | PRN

## 2013-01-02 ENCOUNTER — Encounter: Payer: Self-pay | Admitting: Cardiology

## 2013-01-29 ENCOUNTER — Encounter: Payer: Self-pay | Admitting: Neurology

## 2013-01-29 ENCOUNTER — Ambulatory Visit (INDEPENDENT_AMBULATORY_CARE_PROVIDER_SITE_OTHER): Payer: Medicare Other | Admitting: Neurology

## 2013-01-29 VITALS — BP 124/78 | HR 80 | Temp 97.8°F | Resp 12 | Ht 70.0 in | Wt 219.0 lb

## 2013-01-29 DIAGNOSIS — R270 Ataxia, unspecified: Secondary | ICD-10-CM

## 2013-01-29 DIAGNOSIS — G609 Hereditary and idiopathic neuropathy, unspecified: Secondary | ICD-10-CM

## 2013-01-29 DIAGNOSIS — R296 Repeated falls: Secondary | ICD-10-CM

## 2013-01-29 DIAGNOSIS — R279 Unspecified lack of coordination: Secondary | ICD-10-CM

## 2013-01-29 DIAGNOSIS — E1149 Type 2 diabetes mellitus with other diabetic neurological complication: Secondary | ICD-10-CM

## 2013-01-29 DIAGNOSIS — Z9181 History of falling: Secondary | ICD-10-CM

## 2013-01-29 LAB — FOLATE: Folate: 18.6 ng/mL (ref >5.9)

## 2013-01-29 LAB — VITAMIN B12: Vitamin B-12: 188 pg/mL — ABNORMAL LOW (ref 211–911)

## 2013-01-29 NOTE — Progress Notes (Signed)
Tony Lee was seen today in the movement disorders clinic for neurologic consultation at the request of Tony Pao, MD.  The consultation is for the evaluation of tremor and falls.  The pt is a 79 y.o. male who presents today to eval tremor/falls.  The patient states that this started about 1 year ago.  He noted last summer that he was [place] the mountains and began to have trouble walking.  He attributed that to bursitis.  He had 2 falls.  With the one fall, he went outside in the fog and fell.  He did not get hurt.  The most recent fall was about a week ago.  He was travelling the [place] and he felt himself going forward.  His head "was way ahead of my feet."  He went to the park bench but missed it and fell.  Lately, he has noted that he is not walking straight when walking.  He is more unsteady when he turns.  He has no paresthesias of the hands/feet.  He goes to a chair exercise class where he lives at [facility] and notices that when he balances on 1 foot, he has to touch a chair for balance.  This significantly helps.  He does not have to grab onto the chair, just touch it lightly.  Specific Symptoms:  Tremor: yes, but only with eating so only notes it with R hand.  No shake at rest Voice: deeper but no hypophonic speech, still sings bass Sleep:   Vivid Dreams:  no  Acting out dreams:  yes (some active nights but generally "still) Wet Pillows: yes (rarely) Postural symptoms:  yes  Falls?  yes Bradykinesia symptoms: rare bradykinesia Loss of smell:  yes Loss of taste:  yes Urinary Incontinence:  no Difficulty Swallowing:  no Handwriting, micrographia: yes Trouble with ADL's:  no  Trouble buttoning clothing: yes Depression:  no Memory changes:  yes (mild short term memory loss) Hallucinations:  no  visual distortions: no N/V:  no Lightheaded:  no  Syncope: no Diplopia:  no Dyskinesia:  no  Neuroimaging has not previously been performed.    PREVIOUS  MEDICATIONS: none to date  ALLERGIES:   No Known Allergies  CURRENT MEDICATIONS:  Current Outpatient Prescriptions on File Prior to Visit  Medication Sig Dispense Refill  . aspirin 325 MG tablet Take 325 mg by mouth daily.        . Choline Fenofibrate 135 MG capsule Take 135 mg by mouth daily.        . metoprolol tartrate (LOPRESSOR) 25 MG tablet TAKE 1 TABLET BY MOUTH EVERY DAY  30 tablet  5  . nitroGLYCERIN (NITROSTAT) 0.4 MG SL tablet Place 1 tablet (0.4 mg total) under the tongue every 5 (five) minutes as needed.  30 tablet  0  . simvastatin (ZOCOR) 20 MG tablet Take 20 mg by mouth at bedtime.         No current facility-administered medications on file prior to visit.    PAST MEDICAL HISTORY:   Past Medical History  Diagnosis Date  . Coronary artery disease   . Cancer     prostate  . RBBB (right bundle branch block)   . Palpitations   . Dyslipidemia   . Esophageal stricture   . Diabetes type 2, controlled     pt denies: last A1C 6.7, noted in PCP notes    PAST SURGICAL HISTORY:   Past Surgical History  Procedure Laterality Date  . Right  shoulder       replacement  . Cardiac catheterization      Dr. Martinique  . Coronary artery bypass graft  Oct. 2008    x 5,Dr.Gerhardt  . Ankle surgery Left   . Vein strippping    . Appendectomy    . Hernia repair Bilateral   . Prostatectomy      radical  . Cataract extraction w/ intraocular lens  implant, bilateral      SOCIAL HISTORY:   History   Social History  . Marital Status: Married    Spouse Name: N/A    Number of Children: 3  . Years of Education: N/A   Occupational History  . printing-retired    Social History Main Topics  . Smoking status: Former Research scientist (life sciences)  . Smokeless tobacco: Not on file     Comment: quit 45 years ago  . Alcohol Use: Yes     Comment: social, 1-2 times per week   . Drug Use: No  . Sexually Active: Not on file   Other Topics Concern  . Not on file   Social History Narrative  . No  narrative on file    FAMILY HISTORY:   Family Status  Relation Status Death Age  . Father Deceased     heart failure  . Brother Deceased     2, CABG, MI, PD  . Mother Deceased     dementia  . Child Alive     2 adopted, 1 biological, healthy    ROS:  A complete 10 system review of systems was obtained and was unremarkable apart from what is mentioned above.  PHYSICAL EXAMINATION:    VITALS:   Filed Vitals:   01/29/13 0805  BP: 124/78  Pulse: 80  Temp: 97.8 F (36.6 C)  Resp: 12  Height: 5\' 10"  (1.778 m)  Weight: 219 lb (99.338 kg)    GEN:  The patient appears stated age and is in NAD. HEENT:  Normocephalic, atraumatic.  The mucous membranes are moist. The superficial temporal arteries are without ropiness or tenderness. CV:  RRR Lungs:  CTAB Neck/HEME:  There are no carotid bruits bilaterally.  Neurological examination:  Orientation: The patient is alert and oriented x3. Fund of knowledge is appropriate.  Recent and remote memory are intact.  Attention and concentration are normal.    Able to name objects and repeat phrases. Cranial nerves: There is good facial symmetry. Pupils are equal round and reactive to light bilaterally. Fundoscopic exam reveals clear margins bilaterally. Extraocular muscles are intact. The visual fields are full to confrontational testing. The speech is fluent and clear. Soft palate rises symmetrically and there is no tongue deviation. Hearing is intact to conversational tone. Sensation: Sensation is intact to light and pinprick throughout (facial, trunk, extremities). Vibration is markedly decreased in a distal fashion.  Pinprick is decreased in a stocking distribution, but more so on the left than the right.  He does report some mildly decreased pinprick on the right face compared to the left.  Otherwise, there was no asymmetries. There is no extinction with double simultaneous stimulation. There is no sensory dermatomal level identified. Motor:  Strength is 5/5 in the bilateral upper and lower extremities.   Shoulder shrug is equal and symmetric.  There is no pronator drift. Deep tendon reflexes: Deep tendon reflexes are 2/4 at the bilateral biceps, triceps, brachioradialis, patella and absent at the bilateral achilles. Plantar responses are downgoing bilaterally.  Movement examination: Tone: There is normal tone in  the bilateral upper extremities.  The tone in the lower extremities is normal.  Abnormal movements: There is no rest tremor.  There is perhaps just a minimal intention tremor on the right. Coordination:  There is no significant decremation with RAM's, Including alternating supination and pronation of the forearm, hand opening and closing, finger taps, heel taps and toe taps. Gait and Station: The patient has no difficulty arising out of a deep-seated chair without the use of the hands. The patient's stride length is normal.  He has a very wide-based gait.  He does have postural instability The patient has a negative pull test.      ASSESSMENT/PLAN:  1.  Gait instability.  -Based on hx and PE, I think that the biggest issue is an idiopathic, diffuse peripheral neuropathy.  -I will draw labs including B12, folate, RPR, serum and urinary protein electrophoresis with immunofixation.  I did notice that the patients last hemoglobin A1c was 6.7 and it is possible (and even probable) that it is from a mild DM.  The pt denied having DM today, but I saw it listed as a "problem" in his PCP notes.  - I see no evidence of an idiopathic Parkinson's disease.  He may have some mild vascular parkinsonism, but again I think that the biggest problem is peripheral neuropathy.  Safety was discussed.  Patient education provided.  -He will have an MRI of the brain.  -[place] and is leaving in about a week.  I told him to call me for the results and I would like to see him within 6 months.  -He will have  physical therapy for gait and balance training.  I think a cane or walking stick may be of great value.

## 2013-01-29 NOTE — Patient Instructions (Addendum)
1.  I will get labs today and we will set up your MRI brain 2.  Take your Physical therapy RX with you and [place] 3.  I would like to see you within 6 months, but you can call me for the results of the testing before that if you want.  Your MRI is scheduled for Thursday, May 29th at 9:00am.  Please arrive to [facility], first floor admitting (intrance A, off [facility]) by 8:45am.  [phone].

## 2013-02-03 LAB — IMMUNOFIXATION ELECTROPHORESIS
IgA: 173 mg/dL (ref 68–379)
IgM, Serum: 74 mg/dL (ref 41–251)
Total Protein, Serum Electrophoresis: 7 g/dL (ref 6.0–8.3)

## 2013-02-03 LAB — UIFE/LIGHT CHAINS/TP QN, 24-HR UR
Albumin, U: DETECTED
Alpha 2, Urine: DETECTED — AB
Free Kappa/Lambda Ratio: 6.15 ratio (ref 2.04–10.37)
Free Lambda Lt Chains,Ur: 0.81 mg/dL — ABNORMAL HIGH (ref 0.02–0.67)
Total Protein, Urine: 8.3 mg/dL

## 2013-02-03 LAB — PROTEIN ELECTROPHORESIS, SERUM
Albumin ELP: 59.2 % (ref 55.8–66.1)
Alpha-2-Globulin: 9 % (ref 7.1–11.8)
Beta Globulin: 7.5 % — ABNORMAL HIGH (ref 4.7–7.2)
Total Protein, Serum Electrophoresis: 7.2 g/dL (ref 6.0–8.3)

## 2013-02-05 ENCOUNTER — Ambulatory Visit (HOSPITAL_COMMUNITY)
Admission: RE | Admit: 2013-02-05 | Discharge: 2013-02-05 | Disposition: A | Discharge status: Home / Self Care | Payer: Medicare Other | Source: Ambulatory Visit | Attending: Neurology | Admitting: Neurology

## 2013-02-05 ENCOUNTER — Other Ambulatory Visit: Payer: Self-pay

## 2013-02-05 DIAGNOSIS — J3489 Other specified disorders of nose and nasal sinuses: Secondary | ICD-10-CM | POA: Insufficient documentation

## 2013-02-05 DIAGNOSIS — D472 Monoclonal gammopathy: Secondary | ICD-10-CM

## 2013-02-05 DIAGNOSIS — R270 Ataxia, unspecified: Secondary | ICD-10-CM

## 2013-02-05 DIAGNOSIS — G319 Degenerative disease of nervous system, unspecified: Secondary | ICD-10-CM | POA: Insufficient documentation

## 2013-02-05 DIAGNOSIS — R279 Unspecified lack of coordination: Secondary | ICD-10-CM | POA: Insufficient documentation

## 2013-02-05 DIAGNOSIS — Z9181 History of falling: Secondary | ICD-10-CM | POA: Insufficient documentation

## 2013-02-05 DIAGNOSIS — R296 Repeated falls: Secondary | ICD-10-CM

## 2013-02-06 ENCOUNTER — Telehealth: Payer: Self-pay | Admitting: Oncology

## 2013-02-06 NOTE — Telephone Encounter (Signed)
S/W PT IN RE NP APPT 06/25 @ 10:30 W/DR. HA REFERRING- REBECCA TAT DX MONOCLONAL PARAPROTEINEMIA WELCOME PACKET MAILED

## 2013-02-09 ENCOUNTER — Telehealth: Payer: Self-pay | Admitting: Oncology

## 2013-02-09 NOTE — Telephone Encounter (Signed)
C/D 02/09/13 for appt. 03/04/13

## 2013-03-03 ENCOUNTER — Encounter: Payer: Self-pay | Admitting: Oncology

## 2013-03-03 DIAGNOSIS — D472 Monoclonal gammopathy: Secondary | ICD-10-CM

## 2013-03-03 HISTORY — DX: Monoclonal gammopathy: D47.2

## 2013-03-04 ENCOUNTER — Telehealth: Payer: Self-pay | Admitting: Oncology

## 2013-03-04 ENCOUNTER — Ambulatory Visit: Payer: Medicare Other

## 2013-03-04 ENCOUNTER — Encounter: Payer: Self-pay | Admitting: Oncology

## 2013-03-04 ENCOUNTER — Other Ambulatory Visit (HOSPITAL_BASED_OUTPATIENT_CLINIC_OR_DEPARTMENT_OTHER): Payer: Medicare Other | Admitting: Lab

## 2013-03-04 ENCOUNTER — Ambulatory Visit (HOSPITAL_BASED_OUTPATIENT_CLINIC_OR_DEPARTMENT_OTHER): Payer: Medicare Other | Admitting: Oncology

## 2013-03-04 VITALS — BP 147/72 | HR 72 | Temp 97.7°F | Resp 18 | Ht 70.0 in | Wt 222.1 lb

## 2013-03-04 DIAGNOSIS — D472 Monoclonal gammopathy: Secondary | ICD-10-CM

## 2013-03-04 LAB — COMPREHENSIVE METABOLIC PANEL (CC13)
ALT: 14 U/L (ref 0–55)
AST: 17 U/L (ref 5–34)
Albumin: 3.5 g/dL (ref 3.5–5.0)
Alkaline Phosphatase: 38 U/L — ABNORMAL LOW (ref 40–150)
BUN: 24.4 mg/dL (ref 7.0–26.0)
CO2: 22 meq/L (ref 22–29)
Calcium: 9.1 mg/dL (ref 8.4–10.4)
Chloride: 104 meq/L (ref 98–107)
Creatinine: 1.7 mg/dL — ABNORMAL HIGH (ref 0.7–1.3)
Glucose: 167 mg/dL — ABNORMAL HIGH (ref 70–99)
Potassium: 4.3 meq/L (ref 3.5–5.1)
Sodium: 136 meq/L (ref 136–145)
Total Bilirubin: 0.48 mg/dL (ref 0.20–1.20)
Total Protein: 6.8 g/dL (ref 6.4–8.3)

## 2013-03-04 LAB — CBC WITH DIFFERENTIAL/PLATELET
BASO%: 0.3 % (ref 0.0–2.0)
Basophils Absolute: 0 10*3/uL (ref 0.0–0.1)
EOS%: 3.2 % (ref 0.0–7.0)
Eosinophils Absolute: 0.2 10*3/uL (ref 0.0–0.5)
HCT: 39.7 % (ref 38.4–49.9)
HGB: 13.6 g/dL (ref 13.0–17.1)
LYMPH%: 24.9 % (ref 14.0–49.0)
MCH: 32.3 pg (ref 27.2–33.4)
MCHC: 34.3 g/dL (ref 32.0–36.0)
MCV: 94.1 fL (ref 79.3–98.0)
MONO#: 0.5 10*3/uL (ref 0.1–0.9)
MONO%: 7.3 % (ref 0.0–14.0)
NEUT#: 4.5 10*3/uL (ref 1.5–6.5)
NEUT%: 64.3 % (ref 39.0–75.0)
Platelets: 251 10*3/uL (ref 140–400)
RBC: 4.22 10*6/uL (ref 4.20–5.82)
RDW: 12.9 % (ref 11.0–14.6)
WBC: 6.9 10*3/uL (ref 4.0–10.3)
lymph#: 1.7 10*3/uL (ref 0.9–3.3)

## 2013-03-04 NOTE — Telephone Encounter (Signed)
Gave pt appt for lab and MD, for October 2014 pt will have scan tomorrow @ [facility]

## 2013-03-04 NOTE — Patient Instructions (Addendum)
1.  Issue:  Monoclonal protein in the urine.  2.  Most likely diagnosis:  MGUS (monoclonal gammopathy of unknown significance).  Only about 1% of patients with MGUS per year progresses to active multiple myeloma.  MGUS is akin to abnormal PAP smear, we need to keep an eye on it but no chemo is needed.  3.  We need to rule out multiple myeloma or related diseases (plasma cell dyscrasia).  Untreated, these diseases can result in anemia, kidney failure, bone fracture, hypercalcemia. 4.  Work up:    *  24-hour urine collection to make sure not much monoclonal protein.  *  Skeletal Xray to rule out lytic bone lesions.  *  If you have either of these above, we will need to perform bone marrow biopsy to rule out myeloma (or related diseases).  *  If not, we will continue to monitor your blood work and consider work up if the monoclonal protein significantly increases, or if you develop one of the followings:  anemia, kidney failure, bone fracture, hypercalcemia.

## 2013-03-04 NOTE — Progress Notes (Signed)
[place]  Telephone:([phone]) [phone] Fax:([phone]) [place]    Referral MD:  Eustace Quail. Tat, D.O.  Reason for Referral: positive Bence-Jones protein.     HPI:  Tony Lee is an 79 year-old man with hx of CAD, HLP.  He has been having progressive altered sense of position, somewhat dizzy, and tremor in the right hand.  He was evaluated by Dr. Carles Collet from Neurology.  He was thought to have idiopathic, diffuse peripheral neuropathy.  Incidentally, he was found to have positive Bence Jones protein.  Therefore, he was kindly referred to the [facility] for evaluation.   Tony Lee presented to the clinic for the first time by himself today.  Beside the altered sense of position, dizziness, and tremor, he feels fine.  Patient denies fever, anorexia, weight loss, fatigue, headache, visual changes, confusion, drenching night sweats, palpable lymph node swelling, mucositis, odynophagia, dysphagia, nausea vomiting, jaundice, chest pain, palpitation, shortness of breath, dyspnea on exertion, productive cough, gum bleeding, epistaxis, hematemesis, hemoptysis, abdominal pain, abdominal swelling, early satiety, melena, hematochezia, hematuria, skin rash, spontaneous bleeding, joint swelling, joint pain, heat or cold intolerance, bowel bladder incontinence, back pain, focal motor weakness.    Past Medical History  Diagnosis Date  . Coronary artery disease   . Cancer 2004    prostate  . RBBB (right bundle branch block)   . Palpitations   . Dyslipidemia   . Esophageal stricture   . Borderline diabetes   . Monoclonal paraproteinemia 03/03/2013  :    Past Surgical History  Procedure Laterality Date  . Right shoulder       replacement  . Cardiac catheterization      Dr. Martinique  . Coronary artery bypass graft  Oct. 2008    x 5,Dr.Gerhardt  . Ankle surgery Left   . Vein strippping    . Appendectomy    . Hernia repair Bilateral   .  Prostatectomy  2004    radical  . Cataract extraction w/ intraocular lens  implant, bilateral    :   CURRENT MEDS: Current Outpatient Prescriptions  Medication Sig Dispense Refill  . aspirin 325 MG tablet Take 325 mg by mouth daily.        . Choline Fenofibrate 135 MG capsule Take 135 mg by mouth daily.        . metoprolol tartrate (LOPRESSOR) 25 MG tablet TAKE 1 TABLET BY MOUTH EVERY DAY  30 tablet  5  . nitroGLYCERIN (NITROSTAT) 0.4 MG SL tablet Place 1 tablet (0.4 mg total) under the tongue every 5 (five) minutes as needed.  30 tablet  0  . omeprazole (PRILOSEC) 40 MG capsule Take 40 mg by mouth daily.       . simvastatin (ZOCOR) 20 MG tablet Take 20 mg by mouth at bedtime.         No current facility-administered medications for this visit.      No Known Allergies:  Family History  Problem Relation Age of Onset  . Heart failure Father     age 57  . Heart attack Brother     age 64 post third open heart surgery  . Heart attack Brother   :  History   Social History  . Marital Status: Married    Spouse Name: N/A    Number of Children: 3  . Years of Education: N/A   Occupational History  . printing-retired    Social History Main  Topics  . Smoking status: Former Smoker -- 0.50 packs/day for 20 years    Quit date: 09/11/1967  . Smokeless tobacco: Never Used     Comment: quit 45 years ago  . Alcohol Use: Yes     Comment: social, 1-2 times per week   . Drug Use: No  . Sexually Active: Not on file   Other Topics Concern  . Not on file   Social History Narrative  . No narrative on file  :  REVIEW OF SYSTEM:  The rest of the 14-point review of sytem was negative.   Exam: ECOG: 0.   General:  well-nourished man, in no acute distress.  Eyes:  no scleral icterus.  ENT:  There were no oropharyngeal lesions.  Neck was without thyromegaly.  Lymphatics:  Negative cervical, supraclavicular or axillary adenopathy.  Respiratory: lungs were clear bilaterally without  wheezing or crackles.  Cardiovascular:  Regular rate and rhythm, S1/S2, without murmur, rub or gallop.  There was no pedal edema.  GI:  abdomen was soft, flat, nontender, nondistended, without organomegaly.  Muscoloskeletal:  no spinal tenderness of palpation of vertebral spine.  Skin exam was without echymosis, petichae.  Neuro exam was nonfocal.  Patient was able to get on and off exam table without assistance.  Gait was normal.  Patient was alert and oriented.  Attention was good.   Language was appropriate.  Mood was normal without depression.  Speech was not pressured.  Thought content was not tangential.    LABS:  Lab Results  Component Value Date   WBC 6.9 03/04/2013   HGB 13.6 03/04/2013   HCT 39.7 03/04/2013   PLT 251 03/04/2013   GLUCOSE 167* 03/04/2013   CHOL  Value: 112        ATP III CLASSIFICATION:  <200     mg/dL   Desirable  200-239  mg/dL   Borderline High  >=240    mg/dL   High 06/30/2007   TRIG 122 06/30/2007   HDL 30* 06/30/2007   LDLCALC  Value: 58        Total Cholesterol/HDL:CHD Risk Coronary Heart Disease Risk Table                     Men   Women  1/2 Average Risk   3.4   3.3 06/30/2007   ALT 14 03/04/2013   AST 17 03/04/2013   NA 136 03/04/2013   K 4.3 03/04/2013   CL 104 03/04/2013   CREATININE 1.7* 03/04/2013   BUN 24.4 03/04/2013   CO2 22 03/04/2013   INR 1.4 07/02/2007       ASSESSMENT AND PLAN:   1.  Issue:  Monoclonal protein in the urine.  -  Most likely diagnosis:  MGUS (monoclonal gammopathy of unknown significance).  Only about 1% of patients with MGUS per year progresses to active multiple myeloma.  Patients with MGUS do not need chemo unless there is progression to myeloma.  -  We need to rule out multiple myeloma or related diseases (plasma cell dyscrasia).  Untreated, these diseases can result in anemia, kidney failure, bone fracture, hypercalcemia. -  Work up:    *  24-hour urine collection to make sure not much monoclonal protein.  *  Skeletal Xray to  rule out lytic bone lesions.  *  If you have either of these above, we will need to perform bone marrow biopsy to rule out myeloma (or related diseases).  *  If not, we  will continue to monitor your blood work and consider work up if the monoclonal protein significantly increases, or if you develop one of the followings:  anemia, kidney failure, bone fracture, hypercalcemia.  2.  Chronic kidney disease, stage II-III, stable for years:   Most likely due to HTN.  However, we'll need to rule out myeloma.   3.  Dizziness/altered sense of balance:  Most likely benign.  However, again, well need to rule out plasma cell dyscrasia.   4.  Follow up:  Pending work up.    The length of time of the face-to-face encounter was 45 minutes. More than 50% of time was spent counseling and coordination of care.     Thank you for this referral.      Huan T. Lamonte Sakai, M.D.

## 2013-03-04 NOTE — Progress Notes (Signed)
Checked in new pt with no financial concerns. °

## 2013-03-05 ENCOUNTER — Ambulatory Visit (HOSPITAL_COMMUNITY)
Admission: RE | Admit: 2013-03-05 | Discharge: 2013-03-05 | Disposition: A | Discharge status: Home / Self Care | Payer: Medicare Other | Source: Ambulatory Visit | Attending: Oncology | Admitting: Oncology

## 2013-03-05 ENCOUNTER — Ambulatory Visit: Payer: Medicare Other | Admitting: Lab

## 2013-03-05 DIAGNOSIS — R930 Abnormal findings on diagnostic imaging of skull and head, not elsewhere classified: Secondary | ICD-10-CM | POA: Insufficient documentation

## 2013-03-05 DIAGNOSIS — D472 Monoclonal gammopathy: Secondary | ICD-10-CM | POA: Insufficient documentation

## 2013-03-05 LAB — KAPPA/LAMBDA LIGHT CHAINS: Kappa:Lambda Ratio: 1.29 (ref 0.26–1.65)

## 2013-03-09 ENCOUNTER — Encounter: Payer: Self-pay | Admitting: Oncology

## 2013-03-09 LAB — UIFE/LIGHT CHAINS/TP QN, 24-HR UR
Free Kappa/Lambda Ratio: 7.44 ratio (ref 2.04–10.37)
Free Lambda Lt Chains,Ur: 0.48 mg/dL (ref 0.02–0.67)
Free Lt Chn Excr Rate: 67.83 mg/d
Time: 24 hours
Total Protein, Urine-Ur/day: 110 mg/d (ref 10–140)
Total Protein, Urine: 5.8 mg/dL
Volume, Urine: 1900 mL

## 2013-03-20 ENCOUNTER — Encounter: Payer: Self-pay | Admitting: *Deleted

## 2013-03-20 NOTE — Progress Notes (Signed)
Patient screened by Distress Thermometer.  Patient scored a 0 on thermometer, no referrals made at this time.

## 2013-03-23 ENCOUNTER — Telehealth: Payer: Self-pay | Admitting: Oncology

## 2013-03-23 NOTE — Telephone Encounter (Signed)
Pt called to cx October appts. Per pt he had recent labs and nothing was wrong so he wants to cx. Pt does not wish to r/s. Message to [facility] via EPIC.

## 2013-04-15 ENCOUNTER — Other Ambulatory Visit: Payer: Self-pay

## 2013-07-03 ENCOUNTER — Ambulatory Visit: Payer: Medicare Other

## 2013-07-03 ENCOUNTER — Other Ambulatory Visit: Payer: Medicare Other | Admitting: Lab

## 2013-07-16 ENCOUNTER — Other Ambulatory Visit: Payer: Self-pay

## 2013-07-30 ENCOUNTER — Ambulatory Visit (INDEPENDENT_AMBULATORY_CARE_PROVIDER_SITE_OTHER): Payer: Medicare Other | Admitting: Cardiology

## 2013-07-30 ENCOUNTER — Encounter: Payer: Self-pay | Admitting: Cardiology

## 2013-07-30 VITALS — BP 133/79 | HR 86 | Ht 69.0 in | Wt 220.0 lb

## 2013-07-30 DIAGNOSIS — I2510 Atherosclerotic heart disease of native coronary artery without angina pectoris: Secondary | ICD-10-CM

## 2013-07-30 DIAGNOSIS — I4510 Unspecified right bundle-branch block: Secondary | ICD-10-CM

## 2013-07-30 DIAGNOSIS — E785 Hyperlipidemia, unspecified: Secondary | ICD-10-CM

## 2013-07-30 NOTE — Progress Notes (Signed)
Tony Lee Date of Birth: 11/06/1930   History of Present Illness: Tony Lee is seen today for followup. He was last seen 2 years ago.Marland Kitchen He has a history of coronary disease and is status post coronary bypass surgery in October of 2008. He states he has been diagnosed with diabetes now. He has persistent problems with his balance and has been evaluated by neurology. He does complain that he breaks out into a sweat when he is exerting himself. He denies any significant chest pain or shortness of breath.  Current Outpatient Prescriptions on File Prior to Visit  Medication Sig Dispense Refill  . aspirin 325 MG tablet Take 325 mg by mouth daily.        . metoprolol tartrate (LOPRESSOR) 25 MG tablet TAKE 1 TABLET BY MOUTH EVERY DAY  30 tablet  5  . nitroGLYCERIN (NITROSTAT) 0.4 MG SL tablet Place 1 tablet (0.4 mg total) under the tongue every 5 (five) minutes as needed.  30 tablet  0  . omeprazole (PRILOSEC) 40 MG capsule Take 40 mg by mouth daily.       . simvastatin (ZOCOR) 20 MG tablet Take 20 mg by mouth at bedtime.         No current facility-administered medications on file prior to visit.    No Known Allergies  Past Medical History  Diagnosis Date  . Coronary artery disease   . Cancer 2004    prostate  . RBBB (right bundle branch block)   . Palpitations   . Dyslipidemia   . Esophageal stricture   . Diabetes mellitus type 2 in obese   . Monoclonal paraproteinemia 03/03/2013    Past Surgical History  Procedure Laterality Date  . Right shoulder       replacement  . Cardiac catheterization      Dr. Martinique  . Coronary artery bypass graft  Oct. 2008    x 5,Dr.Gerhardt  . Ankle surgery Left   . Vein strippping    . Appendectomy    . Hernia repair Bilateral   . Prostatectomy  2004    radical  . Cataract extraction w/ intraocular lens  implant, bilateral      History  Smoking status  . Former Smoker -- 0.50 packs/day for 20 years  . Quit date: 09/11/1967   Smokeless tobacco  . Never Used    Comment: quit 45 years ago    History  Alcohol Use  . Yes    Comment: social, 1-2 times per week     Family History  Problem Relation Age of Onset  . Heart failure Father     age 17  . Heart attack Brother     age 33 post third open heart surgery  . Heart attack Brother     Review of Systems: As noted in history of present illness..  All other systems were reviewed and are negative.  Physical Exam: BP 133/79  Pulse 86  Ht 5\' 9"  (1.753 m)  Wt 220 lb (99.791 kg)  BMI 32.47 kg/m2 He is an obese white male in no acute distress. The HEENT exam is normal. The carotids are 2+ without bruits.  There is no thyromegaly.  There is no JVD.  The lungs are clear.   He has a median sternotomy scar. The heart exam reveals a regular rate with a normal S1 and S2.  There are no murmurs, gallops, or rubs.  The PMI is not displaced.   Abdominal exam reveals good bowel  sounds.  There is no guarding or rebound.  There is no hepatosplenomegaly or tenderness.  There are no masses.  Exam of the legs reveal no clubbing, cyanosis, or edema.  The distal pulses are intact.  Cranial nerves II - XII are intact.  Motor and sensory functions are intact.  The gait is normal.  LABORATORY DATA: Dated 07/21/2013: The when 23, creatinine 1.7. Glucose 146. Remainder of chemistry panel was normal. CBC was normal. Total cholesterol 153, triglycerides 176, HDL 39, LDL 79. Urinalysis was normal.  ECG today demonstrates normal sinus rhythm with a right bundle branch block.   Assessment / Plan: 1. Coronary disease status post CABG in 2008. No clear anginal symptoms. He does have sweating with exertion. I recommended a followup Lexiscan Myoview study at this point. We'll continue with his current therapy including aspirin, metoprolol, and statin therapy.  2. Diabetes mellitus type 2  3. Right bundle branch block, chronic  4. Dyslipidemia. On simvastatin and fenofibrate.

## 2013-07-30 NOTE — Patient Instructions (Signed)
We will schedule you for a nuclear stress test.  Continue your current medication.  Work on losing weight.

## 2013-08-18 ENCOUNTER — Encounter: Payer: Self-pay | Admitting: Cardiology

## 2013-08-18 ENCOUNTER — Ambulatory Visit (HOSPITAL_COMMUNITY): Payer: Medicare Other | Attending: Cardiology | Admitting: Radiology

## 2013-08-18 VITALS — BP 153/94 | HR 72 | Ht 70.0 in | Wt 216.0 lb

## 2013-08-18 DIAGNOSIS — I4510 Unspecified right bundle-branch block: Secondary | ICD-10-CM | POA: Insufficient documentation

## 2013-08-18 DIAGNOSIS — Z9861 Coronary angioplasty status: Secondary | ICD-10-CM | POA: Insufficient documentation

## 2013-08-18 DIAGNOSIS — R079 Chest pain, unspecified: Secondary | ICD-10-CM | POA: Insufficient documentation

## 2013-08-18 DIAGNOSIS — I10 Essential (primary) hypertension: Secondary | ICD-10-CM | POA: Insufficient documentation

## 2013-08-18 DIAGNOSIS — Z8249 Family history of ischemic heart disease and other diseases of the circulatory system: Secondary | ICD-10-CM | POA: Insufficient documentation

## 2013-08-18 DIAGNOSIS — R42 Dizziness and giddiness: Secondary | ICD-10-CM | POA: Insufficient documentation

## 2013-08-18 DIAGNOSIS — E119 Type 2 diabetes mellitus without complications: Secondary | ICD-10-CM | POA: Insufficient documentation

## 2013-08-18 DIAGNOSIS — Z87891 Personal history of nicotine dependence: Secondary | ICD-10-CM | POA: Insufficient documentation

## 2013-08-18 DIAGNOSIS — E785 Hyperlipidemia, unspecified: Secondary | ICD-10-CM

## 2013-08-18 DIAGNOSIS — I2510 Atherosclerotic heart disease of native coronary artery without angina pectoris: Secondary | ICD-10-CM | POA: Insufficient documentation

## 2013-08-18 MED ORDER — TECHNETIUM TC 99M SESTAMIBI GENERIC - CARDIOLITE
11.0000 | Freq: Once | INTRAVENOUS | Status: AC | PRN
  Administered 2013-08-18: 11 via INTRAVENOUS

## 2013-08-18 MED ORDER — TECHNETIUM TC 99M SESTAMIBI GENERIC - CARDIOLITE
33.0000 | Freq: Once | INTRAVENOUS | Status: AC | PRN
  Administered 2013-08-18: 33 via INTRAVENOUS

## 2013-08-18 MED ORDER — REGADENOSON 0.4 MG/5ML IV SOLN
0.4000 mg | Freq: Once | INTRAVENOUS | Status: AC
  Administered 2013-08-18: 0.4 mg via INTRAVENOUS

## 2013-08-18 NOTE — Progress Notes (Signed)
[facility] [address] [place], [place] [address] [phone]    Cardiology Nuclear Med Study  Tony Lee is a 79 y.o. male     MRN : [id number]     DOB: 1931-02-14  Procedure Date: 08/18/2013  Nuclear Med Background Indication for Stress Test:  Evaluation for Ischemia and Graft Patency History:  CAD, CABG 2008, MPI 2005 (normal) EF 69% Cardiac Risk Factors: Family History - CAD, History of Smoking, Hypertension, Lipids, NIDDM and RBBB  Symptoms:  Chest Pain and Dizziness   Nuclear Pre-Procedure Caffeine/Decaff Intake:  None NPO After: 7:00pm   Lungs:  clear O2 Sat: 97% on room air. IV 0.9% NS with Angio Cath:  22g  IV Site: R Wrist  IV Started by:  Matilde Haymaker, RN  Chest Size (in):  44 Cup Size: n/a  Height: 5\' 10"  (1.778 m)  Weight:  216 lb (97.977 kg)  BMI:  Body mass index is 30.99 kg/(m^2). Tech Comments:  Lopressor taken at 0300    Nuclear Med Study 1 or 2 day study: 1 day  Stress Test Type:  Treadmill/Lexiscan  Reading MD: Dola Argyle, MD  Order Authorizing Provider:  Geanie Cooley  Resting Radionuclide: Technetium 50m Sestamibi  Resting Radionuclide Dose: 11.0 mCi   Stress Radionuclide:  Technetium 49m Sestamibi  Stress Radionuclide Dose: 33.0 mCi           Stress Protocol Rest HR: 72 Stress HR: 118  Rest BP: 153/94 Stress BP: 169/77  Exercise Time (min): n/a METS: n/a           Dose of Adenosine (mg):  n/a Dose of Lexiscan: 0.4 mg  Dose of Atropine (mg): n/a Dose of Dobutamine: n/a mcg/kg/min (at max HR)  Stress Test Technologist: Glade Lloyd, BS-ES  Nuclear Technologist:  Charlton Amor, CNMT     Rest Procedure:  Myocardial perfusion imaging was performed at rest 45 minutes following the intravenous administration of Technetium 58m Sestamibi. Rest ECG:   Sinus rhythm. Right bundle branch block  Stress Procedure:  The patient received IV Lexiscan 0.4 mg over 15-seconds with concurrent low level  exercise and then Technetium 50m Sestamibi was injected at 30-seconds while the patient continued walking one more minute.  Quantitative spect images were obtained after a 45-minute delay.  During the infusion of Lexiscan, patient complained of his head feeling funny.  This resolve in recovery.   Stress ECG: No significant change from baseline ECG  QPS Raw Data Images:  Normal; no motion artifact; normal heart/lung ratio. Stress Images:  Small area of mild decreased uptake at the base/mid inferolateral wall. Rest Images:  Rest image is the same as a stress image. Subtraction (SDS):  No evidence of ischemia. Transient Ischemic Dilatation (Normal <1.22):  1.04 Lung/Heart Ratio (Normal <0.45):  0.29  Quantitative Gated Spect Images QGS EDV:  73 ml QGS ESV:  28 ml  Impression Exercise Capacity:  Lexiscan with low level exercise. BP Response:  Normal blood pressure response. Clinical Symptoms:  Patient's head feels funny ECG Impression:  No significant ST segment change suggestive of ischemia. Comparison with Prior Nuclear Study: No images to compare  Overall Impression:  This is a low risk scan. There is no ischemia. There is excellent wall motion. There is question of a very slight scar at the base of the inferolateral wall.  LV Ejection Fraction: 62%.  LV Wall Motion:  Normal Wall Motion.  Dola Argyle, MD

## 2013-09-13 ENCOUNTER — Other Ambulatory Visit: Payer: Self-pay | Admitting: Cardiology

## 2013-11-08 IMAGING — CR DG BONE SURVEY MET
10 series · 10 of 10 positions shown · non-contrast
Comparison: None

CLINICAL DATA: Monoclonal paraproteinemia.  Evaluate for
myelomatous lesions.

METASTATIC BONE SURVEY

[w chest pa]
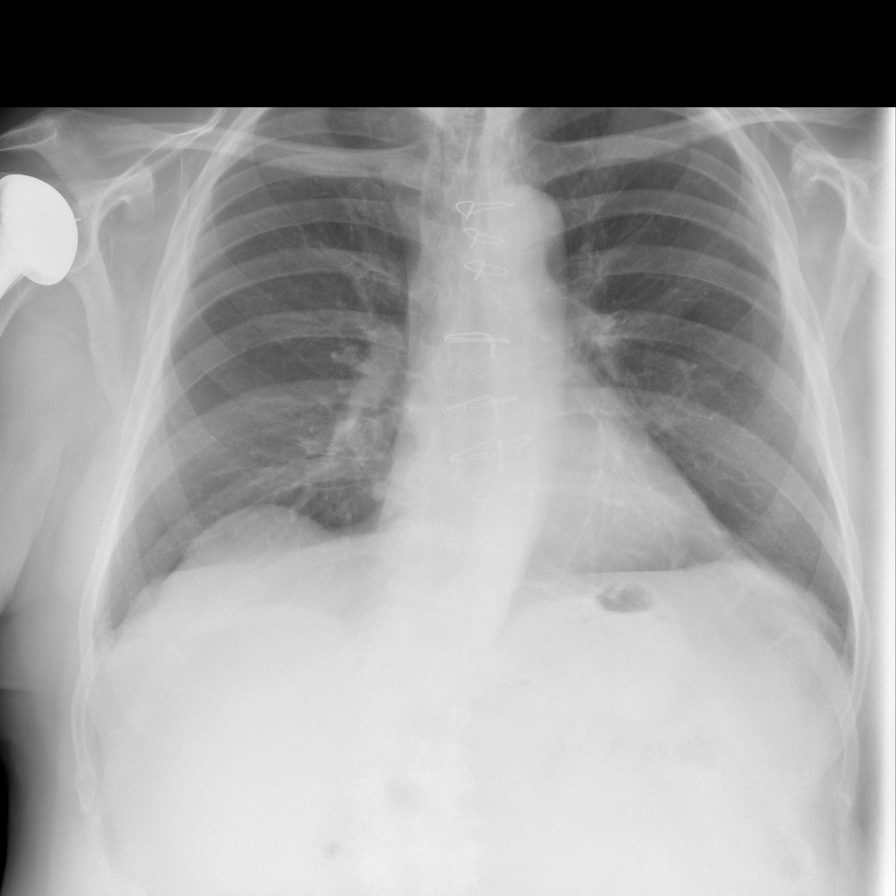

[w c-spine lat *]
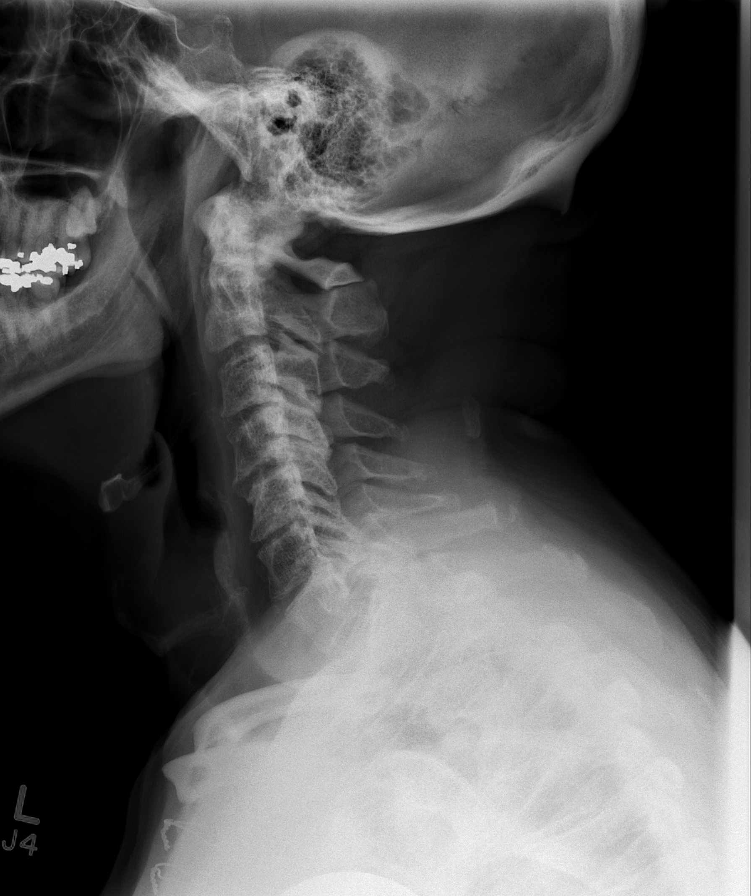

[w swimmers view *]
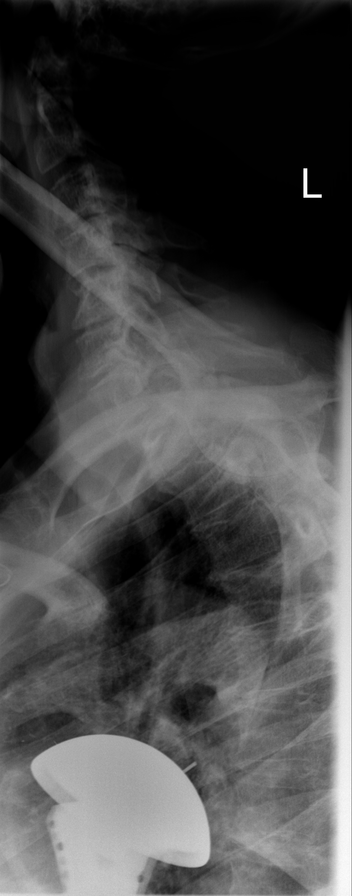

[w skull lat *]
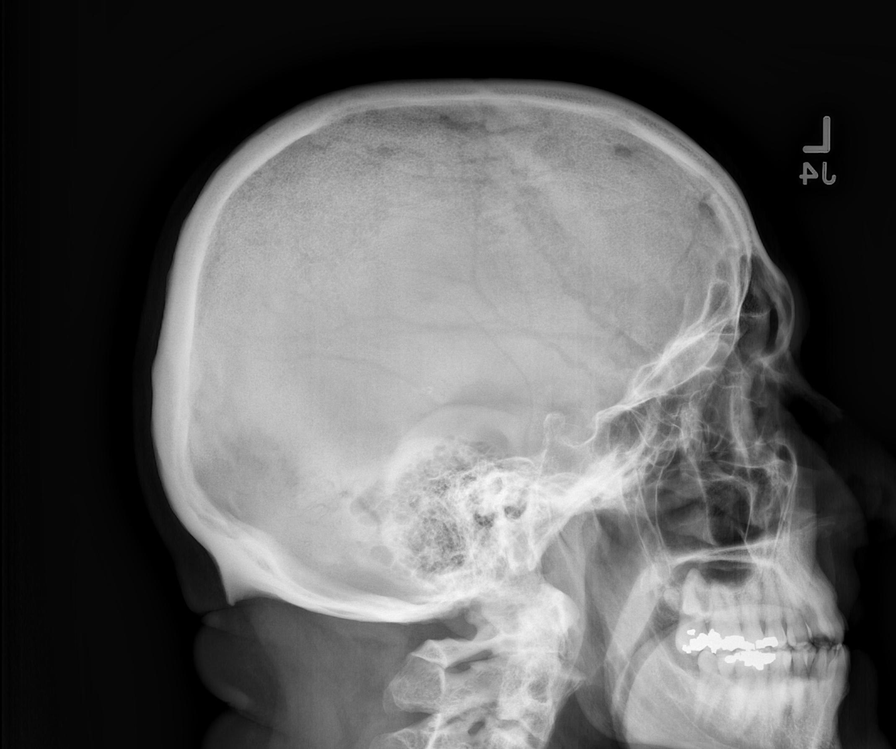

[t c-spine a.p.]
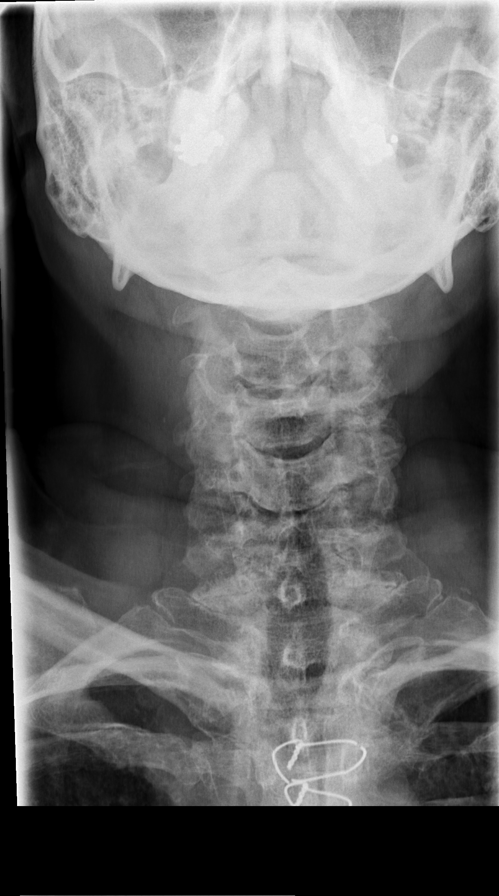

[t t-spine a.p.]
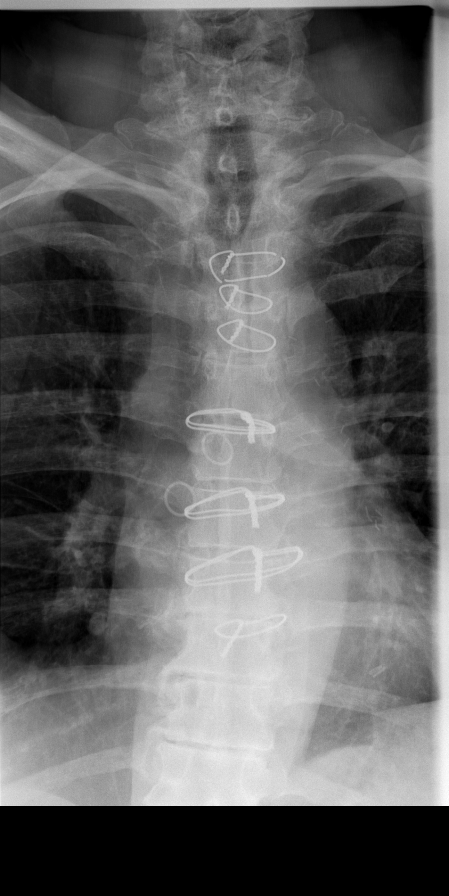

[t l-spine a.p.]
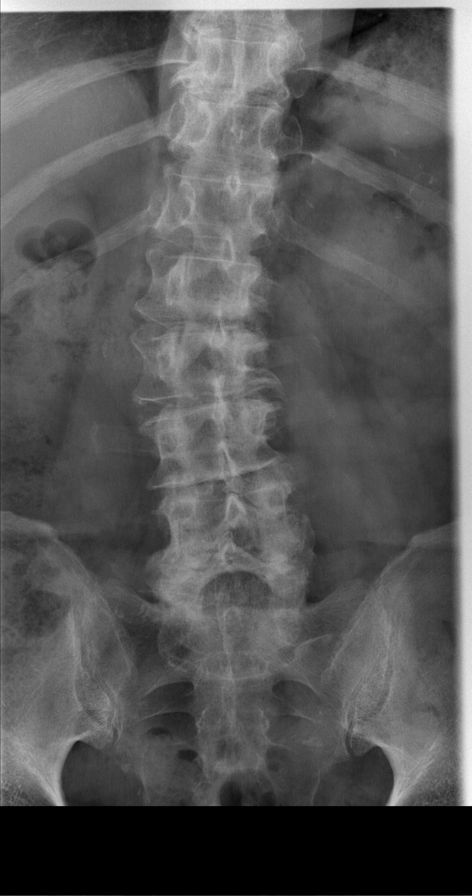

[t pelvis a.p.]
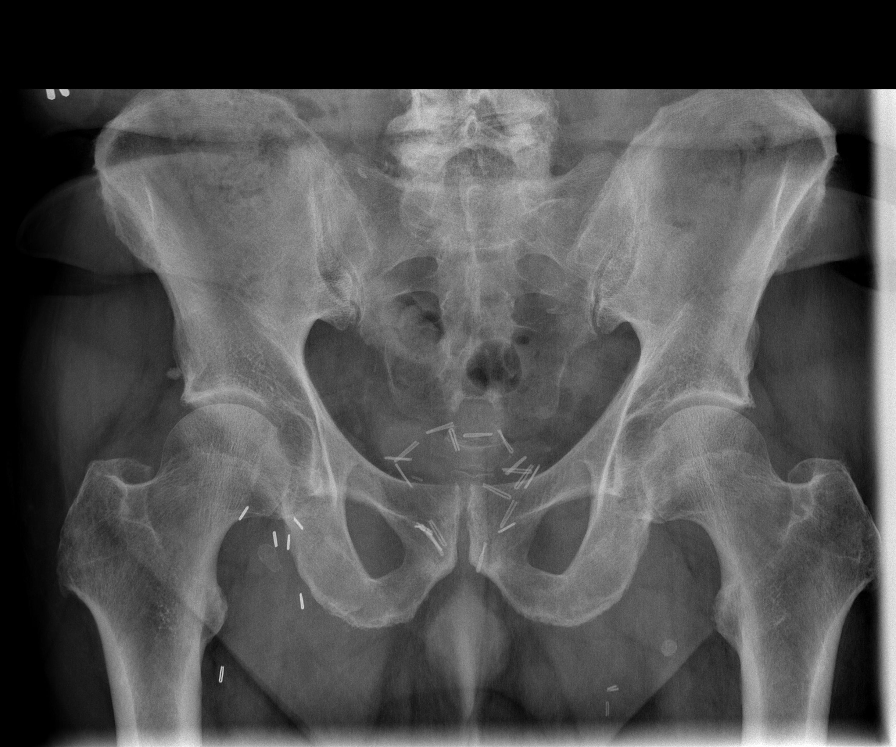

[t femur with hip  ap left]
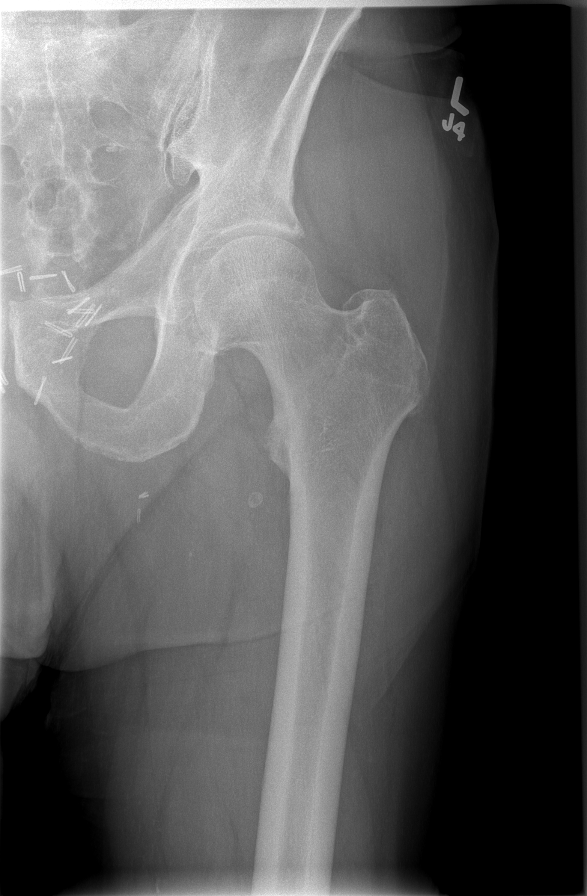

[t femur with knee ap left]
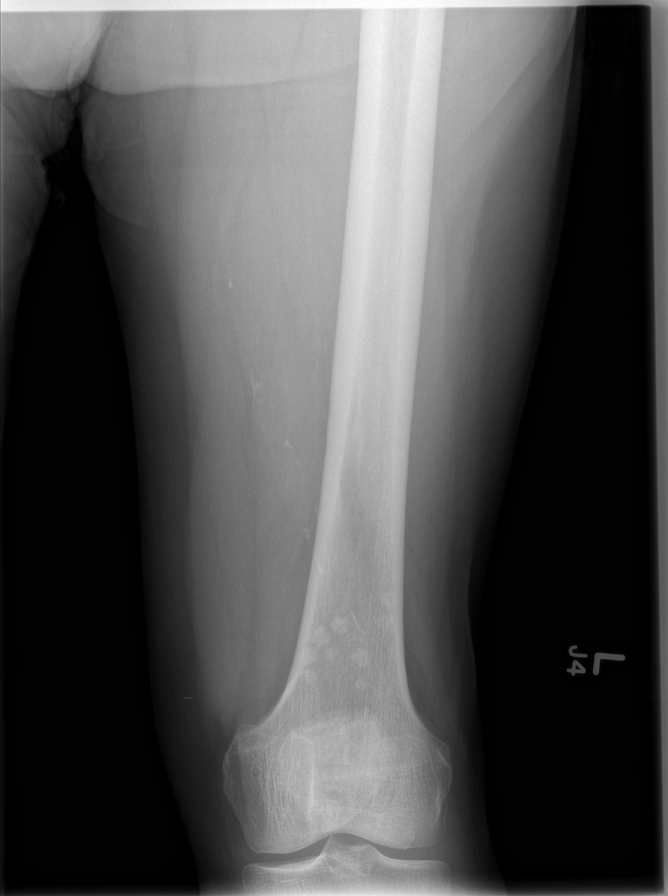

[10 of 10 positions shown; findings below may reference images not displayed]

FINDINGS: There are a few nonspecific lucencies noted on the
lateral skull film.

Moderate degenerative changes involve the spine but no definite
lytic myelomatous lesions.  No pelvic lesions.  The appendicular
skeleton is unremarkable.  The chest demonstrates surgical changes
from bypass surgery.  There is a right humeral head prosthesis.
Surgical changes are noted in the pelvis.  Rounded radiodensities
noted overlying the distal left femur which are most likely
ossified loose bodies in the suprapatellar bursa.  Vascular
calcifications are noted.
IMPRESSION: Indeterminate lucent lesions in the skull.  Cannot exclude myeloma
although I do not see any other definite abnormalities in the
appendicular or axial skeleton.

## 2014-08-27 ENCOUNTER — Ambulatory Visit (INDEPENDENT_AMBULATORY_CARE_PROVIDER_SITE_OTHER): Payer: Medicare Other | Admitting: Cardiology

## 2014-08-27 ENCOUNTER — Encounter: Payer: Self-pay | Admitting: Cardiology

## 2014-08-27 VITALS — BP 124/60 | HR 65 | Ht 69.0 in | Wt 217.0 lb

## 2014-08-27 DIAGNOSIS — E1169 Type 2 diabetes mellitus with other specified complication: Secondary | ICD-10-CM | POA: Insufficient documentation

## 2014-08-27 DIAGNOSIS — I2510 Atherosclerotic heart disease of native coronary artery without angina pectoris: Secondary | ICD-10-CM

## 2014-08-27 DIAGNOSIS — E119 Type 2 diabetes mellitus without complications: Secondary | ICD-10-CM

## 2014-08-27 DIAGNOSIS — I4510 Unspecified right bundle-branch block: Secondary | ICD-10-CM

## 2014-08-27 DIAGNOSIS — E785 Hyperlipidemia, unspecified: Secondary | ICD-10-CM

## 2014-08-27 DIAGNOSIS — E669 Obesity, unspecified: Secondary | ICD-10-CM

## 2014-08-27 DIAGNOSIS — E118 Type 2 diabetes mellitus with unspecified complications: Secondary | ICD-10-CM | POA: Insufficient documentation

## 2014-08-27 NOTE — Patient Instructions (Signed)
Continue your current therapy  I will see you in one year   

## 2014-08-28 NOTE — Progress Notes (Signed)
Tony Lee Date of Birth: August 07, 1931   History of Present Illness: Tony Lee is seen today for followup of CAD. He has a history of coronary disease and is status post coronary bypass surgery in October of 2008. Myoview study in December 2014 showed a slight scar at the base of the inferolateral wall. No ischemia. EF 62%. He is doing well from a cardiac standpoint.  He denies any significant chest pain or shortness of breath. Since his diagnosis with DM he has made dietary changes and has lost 7 lbs. He exercises 3 days/week at [employer]. He continues to have problems with his balance.  Current Outpatient Prescriptions on File Prior to Visit  Medication Sig Dispense Refill  . aspirin 325 MG tablet Take 325 mg by mouth daily.      . Choline Fenofibrate (FENOFIBRIC ACID) 45 MG CPDR Take by mouth.    . Cyanocobalamin (VITAMIN B-12 PO) Take 1 tablet by mouth daily.    Marland Kitchen losartan (COZAAR) 50 MG tablet Take 50 mg by mouth daily.    Marland Kitchen NITROSTAT 0.4 MG SL tablet PLACE 1 TABLET (0.4 MG TOTAL) UNDER THE TONGUE EVERY 5 (FIVE) MINUTES AS NEEDED. 25 tablet 0  . omeprazole (PRILOSEC) 40 MG capsule Take 40 mg by mouth daily.     . simvastatin (ZOCOR) 20 MG tablet Take 20 mg by mouth at bedtime.       No current facility-administered medications on file prior to visit.    No Known Allergies  Past Medical History  Diagnosis Date  . Coronary artery disease   . Cancer 2004    prostate  . RBBB (right bundle branch block)   . Palpitations   . Dyslipidemia   . Esophageal stricture   . Diabetes mellitus type 2 in obese   . Monoclonal paraproteinemia 03/03/2013    Past Surgical History  Procedure Laterality Date  . Right shoulder       replacement  . Cardiac catheterization      Dr. Martinique  . Coronary artery bypass graft  Oct. 2008    x 5,Dr.Gerhardt  . Ankle surgery Left   . Vein strippping    . Appendectomy    . Hernia repair Bilateral   . Prostatectomy  2004    radical  .  Cataract extraction w/ intraocular lens  implant, bilateral      History  Smoking status  . Former Smoker -- 0.50 packs/day for 20 years  . Quit date: 09/11/1967  Smokeless tobacco  . Never Used    Comment: quit 45 years ago    History  Alcohol Use  . Yes    Comment: social, 1-2 times per week     Family History  Problem Relation Age of Onset  . Heart failure Father     age 64  . Heart attack Brother     age 73 post third open heart surgery  . Heart attack Brother     Review of Systems: As noted in history of present illness..  All other systems were reviewed and are negative.  Physical Exam: BP 124/60 mmHg  Pulse 65  Ht 5\' 9"  (1.753 m)  Wt 217 lb (98.431 kg)  BMI 32.03 kg/m2 He is an obese white male in no acute distress. The HEENT exam is normal. The carotids are 2+ without bruits.  There is no thyromegaly.  There is no JVD.  The lungs are clear.   He has a median sternotomy scar. The heart exam  reveals a regular rate with a normal S1 and S2.  There are no murmurs, gallops, or rubs.  The PMI is not displaced.   Abdominal exam reveals good bowel sounds.   There is no hepatosplenomegaly or tenderness.  There are no masses.  Exam of the legs reveal no clubbing, cyanosis, or edema.  The distal pulses are intact.  Cranial nerves II - XII are intact.   LABORATORY DATA: ECG today demonstrates normal sinus rhythm with a right bundle branch block..  Rate 65. I have personally reviewed and interpreted this study.   Assessment / Plan: 1. Coronary disease status post CABG in 2008. Low risk Myoview in December 2014. He is asymptomatic.  We'll continue with his current therapy including aspirin, metoprolol, and statin therapy.  2. Diabetes mellitus type 2- continue dietary modification and exercise  3. Right bundle branch block, chronic  4. Dyslipidemia. On simvastatin and fenofibrate.

## 2015-10-04 ENCOUNTER — Ambulatory Visit (INDEPENDENT_AMBULATORY_CARE_PROVIDER_SITE_OTHER): Payer: PPO | Admitting: Cardiology

## 2015-10-04 ENCOUNTER — Encounter: Payer: Self-pay | Admitting: Cardiology

## 2015-10-04 VITALS — BP 138/78 | HR 69 | Ht 69.75 in | Wt 220.3 lb

## 2015-10-04 DIAGNOSIS — I4510 Unspecified right bundle-branch block: Secondary | ICD-10-CM | POA: Diagnosis not present

## 2015-10-04 DIAGNOSIS — E785 Hyperlipidemia, unspecified: Secondary | ICD-10-CM | POA: Diagnosis not present

## 2015-10-04 DIAGNOSIS — I2510 Atherosclerotic heart disease of native coronary artery without angina pectoris: Secondary | ICD-10-CM

## 2015-10-04 NOTE — Progress Notes (Signed)
Tony Lee Date of Birth: May 14, 1931   History of Present Illness: Tony Lee is seen today for followup of CAD. He has a history of coronary disease and is status post coronary bypass surgery in October of 2008. Myoview study in December 2014 showed a slight scar at the base of the inferolateral wall. No ischemia. EF 62%.  He is doing well from a cardiac standpoint.  He denies any  chest pain or shortness of breath. He is prediabetic and now on Trajenta.  He exercises 3 days/week at [employer] 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   Current Outpatient Prescriptions on File Prior to Visit  Medication Sig Dispense Refill  . aspirin 325 MG tablet Take 325 mg by mouth daily.      . Choline Fenofibrate (FENOFIBRIC ACID) 45 MG CPDR Take by mouth.    . Cyanocobalamin (VITAMIN B-12 PO) Take 1 tablet by mouth daily.    Marland Kitchen losartan (COZAAR) 50 MG tablet Take 50 mg by mouth daily.    . metoprolol succinate (TOPROL-XL) 25 MG 24 hr tablet Take 25 mg by mouth daily.  3  . NITROSTAT 0.4 MG SL tablet PLACE 1 TABLET (0.4 MG TOTAL) UNDER THE TONGUE EVERY 5 (FIVE) MINUTES AS NEEDED. 25 tablet 0  . omeprazole (PRILOSEC) 40 MG capsule Take 40 mg by mouth daily.     . simvastatin (ZOCOR) 20 MG tablet Take 20 mg by mouth at bedtime.       No current facility-administered medications on file prior to visit.    No Known Allergies  Past Medical History  Diagnosis Date  . Coronary artery disease   . Cancer [facility]) 2004    prostate  . RBBB (right bundle branch block)   . Palpitations   . Dyslipidemia   . Esophageal stricture   . Diabetes mellitus type 2 in obese ([place])   . Monoclonal paraproteinemia 03/03/2013    Past Surgical History  Procedure Laterality Date  . Right shoulder       replacement  . Cardiac catheterization      Dr. Martinique  . Coronary artery bypass graft  Oct. 2008    x 5,Dr.Gerhardt  . Ankle surgery Left   . Vein  strippping    . Appendectomy    . Hernia repair Bilateral   . Prostatectomy  2004    radical  . Cataract extraction w/ intraocular lens  implant, bilateral      History  Smoking status  . Former Smoker -- 0.50 packs/day for 20 years  . Quit date: 09/11/1967  Smokeless tobacco  . Never Used    Comment: quit 45 years ago    History  Alcohol Use  . Yes    Comment: social, 1-2 times per week     Family History  Problem Relation Age of Onset  . Heart failure Father     age 83  . Heart attack Brother     age 42 post third open heart surgery  . Heart attack Brother     Review of Systems: As noted in history of present illness..  All other systems were reviewed and are negative.  Physical Exam: BP 138/78 mmHg  Pulse 69  Ht 5' 9.75" (1.772 m)  Wt 99.933 kg (220 lb 5 oz)  BMI 31.83 kg/m2 He is an obese white male in no acute distress. The HEENT exam is normal. The carotids are 2+ without bruits.  There  is no thyromegaly.  There is no JVD.  The lungs are clear.   He has a median sternotomy scar. The heart exam reveals a regular rate with a normal S1 and S2.  There are no murmurs, gallops, or rubs.  The PMI is not displaced.   Abdominal exam reveals good bowel sounds.   There is no hepatosplenomegaly or tenderness.  There are no masses.  Exam of the legs reveal no clubbing, cyanosis, or edema.  The distal pulses are intact.  Cranial nerves II - XII are intact.   LABORATORY DATA: ECG today demonstrates normal sinus rhythm with first degree AV block. Incomplete right bundle branch block..  Rate 69. I have personally reviewed and interpreted this study.   Assessment / Plan: 1. Coronary disease status post CABG in 2008. Low risk Myoview in December 2014. He is asymptomatic.  We'll continue with his current therapy including aspirin, metoprolol, and statin therapy.  2. Diabetes mellitus type 2- continue medical therapy per primary care.   3. Right bundle branch block, chronic  4.  Dyslipidemia. On simvastatin and fenofibrate.

## 2015-10-04 NOTE — Patient Instructions (Signed)
Continue your current therapy  I will see you in one year   

## 2016-01-27 DIAGNOSIS — Z961 Presence of intraocular lens: Secondary | ICD-10-CM | POA: Diagnosis not present

## 2016-01-27 DIAGNOSIS — H52203 Unspecified astigmatism, bilateral: Secondary | ICD-10-CM | POA: Diagnosis not present

## 2016-01-27 DIAGNOSIS — H353131 Nonexudative age-related macular degeneration, bilateral, early dry stage: Secondary | ICD-10-CM | POA: Diagnosis not present

## 2016-01-27 DIAGNOSIS — E119 Type 2 diabetes mellitus without complications: Secondary | ICD-10-CM | POA: Diagnosis not present

## 2016-02-27 DIAGNOSIS — Z955 Presence of coronary angioplasty implant and graft: Secondary | ICD-10-CM | POA: Diagnosis not present

## 2016-02-27 DIAGNOSIS — R808 Other proteinuria: Secondary | ICD-10-CM | POA: Diagnosis not present

## 2016-02-27 DIAGNOSIS — D472 Monoclonal gammopathy: Secondary | ICD-10-CM | POA: Diagnosis not present

## 2016-02-27 DIAGNOSIS — Z8546 Personal history of malignant neoplasm of prostate: Secondary | ICD-10-CM | POA: Diagnosis not present

## 2016-02-27 DIAGNOSIS — Z6831 Body mass index (BMI) 31.0-31.9, adult: Secondary | ICD-10-CM | POA: Diagnosis not present

## 2016-02-27 DIAGNOSIS — I129 Hypertensive chronic kidney disease with stage 1 through stage 4 chronic kidney disease, or unspecified chronic kidney disease: Secondary | ICD-10-CM | POA: Diagnosis not present

## 2016-02-27 DIAGNOSIS — E1129 Type 2 diabetes mellitus with other diabetic kidney complication: Secondary | ICD-10-CM | POA: Diagnosis not present

## 2016-02-27 DIAGNOSIS — N183 Chronic kidney disease, stage 3 (moderate): Secondary | ICD-10-CM | POA: Diagnosis not present

## 2016-02-27 DIAGNOSIS — E669 Obesity, unspecified: Secondary | ICD-10-CM | POA: Diagnosis not present

## 2016-04-17 DIAGNOSIS — L821 Other seborrheic keratosis: Secondary | ICD-10-CM | POA: Diagnosis not present

## 2016-05-29 DIAGNOSIS — Z23 Encounter for immunization: Secondary | ICD-10-CM | POA: Diagnosis not present

## 2016-06-12 DIAGNOSIS — M79671 Pain in right foot: Secondary | ICD-10-CM | POA: Diagnosis not present

## 2016-06-12 DIAGNOSIS — L600 Ingrowing nail: Secondary | ICD-10-CM | POA: Diagnosis not present

## 2016-06-12 DIAGNOSIS — B351 Tinea unguium: Secondary | ICD-10-CM | POA: Diagnosis not present

## 2016-06-12 DIAGNOSIS — E119 Type 2 diabetes mellitus without complications: Secondary | ICD-10-CM | POA: Diagnosis not present

## 2016-06-12 DIAGNOSIS — M79672 Pain in left foot: Secondary | ICD-10-CM | POA: Diagnosis not present

## 2016-08-22 DIAGNOSIS — E784 Other hyperlipidemia: Secondary | ICD-10-CM | POA: Diagnosis not present

## 2016-08-22 DIAGNOSIS — Z125 Encounter for screening for malignant neoplasm of prostate: Secondary | ICD-10-CM | POA: Diagnosis not present

## 2016-08-22 DIAGNOSIS — E1129 Type 2 diabetes mellitus with other diabetic kidney complication: Secondary | ICD-10-CM | POA: Diagnosis not present

## 2016-08-29 DIAGNOSIS — E7800 Pure hypercholesterolemia, unspecified: Secondary | ICD-10-CM | POA: Diagnosis not present

## 2016-08-29 DIAGNOSIS — Z8546 Personal history of malignant neoplasm of prostate: Secondary | ICD-10-CM | POA: Diagnosis not present

## 2016-08-29 DIAGNOSIS — Z0000 Encounter for general adult medical examination without abnormal findings: Secondary | ICD-10-CM | POA: Diagnosis not present

## 2016-08-29 DIAGNOSIS — Z6832 Body mass index (BMI) 32.0-32.9, adult: Secondary | ICD-10-CM | POA: Diagnosis not present

## 2016-08-29 DIAGNOSIS — E1159 Type 2 diabetes mellitus with other circulatory complications: Secondary | ICD-10-CM | POA: Diagnosis not present

## 2016-08-29 DIAGNOSIS — Z955 Presence of coronary angioplasty implant and graft: Secondary | ICD-10-CM | POA: Diagnosis not present

## 2016-08-29 DIAGNOSIS — I10 Essential (primary) hypertension: Secondary | ICD-10-CM | POA: Diagnosis not present

## 2016-08-29 DIAGNOSIS — D472 Monoclonal gammopathy: Secondary | ICD-10-CM | POA: Diagnosis not present

## 2016-08-29 DIAGNOSIS — N183 Chronic kidney disease, stage 3 (moderate): Secondary | ICD-10-CM | POA: Diagnosis not present

## 2016-08-29 DIAGNOSIS — I1310 Hypertensive heart and chronic kidney disease without heart failure, with stage 1 through stage 4 chronic kidney disease, or unspecified chronic kidney disease: Secondary | ICD-10-CM | POA: Diagnosis not present

## 2016-08-29 DIAGNOSIS — E1129 Type 2 diabetes mellitus with other diabetic kidney complication: Secondary | ICD-10-CM | POA: Diagnosis not present

## 2016-08-29 DIAGNOSIS — Z1389 Encounter for screening for other disorder: Secondary | ICD-10-CM | POA: Diagnosis not present

## 2016-11-30 ENCOUNTER — Ambulatory Visit (INDEPENDENT_AMBULATORY_CARE_PROVIDER_SITE_OTHER): Payer: PPO | Admitting: Cardiology

## 2016-11-30 ENCOUNTER — Encounter: Payer: Self-pay | Admitting: Cardiology

## 2016-11-30 VITALS — BP 122/70 | HR 73 | Ht 69.5 in | Wt 213.0 lb

## 2016-11-30 DIAGNOSIS — E1169 Type 2 diabetes mellitus with other specified complication: Secondary | ICD-10-CM

## 2016-11-30 DIAGNOSIS — I2510 Atherosclerotic heart disease of native coronary artery without angina pectoris: Secondary | ICD-10-CM

## 2016-11-30 DIAGNOSIS — E785 Hyperlipidemia, unspecified: Secondary | ICD-10-CM | POA: Diagnosis not present

## 2016-11-30 DIAGNOSIS — I4510 Unspecified right bundle-branch block: Secondary | ICD-10-CM | POA: Diagnosis not present

## 2016-11-30 DIAGNOSIS — E669 Obesity, unspecified: Secondary | ICD-10-CM | POA: Diagnosis not present

## 2016-11-30 NOTE — Patient Instructions (Addendum)
Continue your current therapy  Start back in exercise  I will see you in 6 months.

## 2016-11-30 NOTE — Progress Notes (Signed)
Tony Lee Date of Birth: 09/05/1931   History of Present Illness: Tony Lee is seen today for followup of CAD. He has a history of coronary disease and is status post coronary bypass surgery in October of 2008. Myoview study in December 2014 showed a slight scar at the base of the inferolateral wall. No ischemia. EF 62%.  He is doing well from a cardiac standpoint.  He denies any  chest pain or shortness of breath. He is limited in exercise by a bad hip. He is not interested in surgery. He is diabetic and now on Trajenta. Reports BS about 130. He notes his feet are cold at night. Sometimes has numbness and tingling in left arm. Rare skipped beat. Reports cholesterol has been ok.   Current Outpatient Prescriptions on File Prior to Visit  Medication Sig Dispense Refill  . aspirin 325 MG tablet Take 325 mg by mouth daily.      . Choline Fenofibrate (FENOFIBRIC ACID) 45 MG CPDR Take by mouth.    . Cyanocobalamin (VITAMIN B-12 PO) Take 1 tablet by mouth daily.    . metoprolol succinate (TOPROL-XL) 25 MG 24 hr tablet Take 25 mg by mouth daily.  3  . NITROSTAT 0.4 MG SL tablet PLACE 1 TABLET (0.4 MG TOTAL) UNDER THE TONGUE EVERY 5 (FIVE) MINUTES AS NEEDED. 25 tablet 0  . omeprazole (PRILOSEC) 40 MG capsule Take 40 mg by mouth daily.     . simvastatin (ZOCOR) 20 MG tablet Take 20 mg by mouth at bedtime.      . TRADJENTA 5 MG TABS tablet Take 5 mg by mouth daily. Take 1 tab daily  6   No current facility-administered medications on file prior to visit.     No Known Allergies  Past Medical History:  Diagnosis Date  . Cancer [facility]) 2004   prostate  . Coronary artery disease   . Diabetes mellitus type 2 in obese ([place])   . Dyslipidemia   . Esophageal stricture   . Monoclonal paraproteinemia 03/03/2013  . Palpitations   . RBBB (right bundle branch block)     Past Surgical History:  Procedure Laterality Date  . ANKLE SURGERY Left   . APPENDECTOMY    . CARDIAC CATHETERIZATION     Dr. Martinique  . CATARACT EXTRACTION W/ INTRAOCULAR LENS  IMPLANT, BILATERAL    . CORONARY ARTERY BYPASS GRAFT  Oct. 2008   x 5,Dr.Gerhardt  . HERNIA REPAIR Bilateral   . PROSTATECTOMY  2004   radical  . right shoulder      replacement  . vein strippping      History  Smoking Status  . Former Smoker  . Packs/day: 0.50  . Years: 20.00  . Quit date: 09/11/1967  Smokeless Tobacco  . Never Used    Comment: quit 45 years ago    History  Alcohol Use  . Yes    Comment: social, 1-2 times per week     Family History  Problem Relation Age of Onset  . Heart failure Father     age 18  . Heart attack Brother     age 29 post third open heart surgery  . Heart attack Brother     Review of Systems: As noted in history of present illness..  All other systems were reviewed and are negative.  Physical Exam: BP 122/70   Pulse 73   Ht 5' 9.5" (1.765 m)   Wt 213 lb (96.6 kg)   BMI 31.00 kg/m  He is an obese white male in no acute distress. The HEENT exam is normal. The carotids are 2+ without bruits.  There is no thyromegaly.  There is no JVD.  The lungs are clear.   He has a median sternotomy scar. The heart exam reveals a regular rate with a normal S1 and S2.  There are no murmurs, gallops, or rubs.  The PMI is not displaced.   Abdominal exam reveals good bowel sounds.   There is no hepatosplenomegaly or tenderness.  There are no masses.  Exam of the legs reveal no clubbing, cyanosis, or edema.  All pulses are good.  Cranial nerves II - XII are intact.   LABORATORY DATA: ECG today demonstrates normal sinus rhythm with first degree AV block. Right bundle branch block..  Rate 73. I have personally reviewed and interpreted this study.   Assessment / Plan: 1. Coronary disease status post CABG in 2008. Low risk Myoview in December 2014. He is asymptomatic.  We'll continue with his current therapy including aspirin, metoprolol, and statin therapy. Suggested he could reduce ASA to 81 mg daily  but he likes the anti-inflammatory effects of the full strength.  2. Diabetes mellitus type 2- continue medical therapy per primary care.   3. Right bundle branch block, chronic  4. Dyslipidemia. On simvastatin and fenofibrate. Will request copy of last lab work with primary care.   I will follow up in 6 months.

## 2017-02-01 DIAGNOSIS — H353132 Nonexudative age-related macular degeneration, bilateral, intermediate dry stage: Secondary | ICD-10-CM | POA: Diagnosis not present

## 2017-02-01 DIAGNOSIS — H52203 Unspecified astigmatism, bilateral: Secondary | ICD-10-CM | POA: Diagnosis not present

## 2017-02-01 DIAGNOSIS — H43813 Vitreous degeneration, bilateral: Secondary | ICD-10-CM | POA: Diagnosis not present

## 2017-02-01 DIAGNOSIS — E119 Type 2 diabetes mellitus without complications: Secondary | ICD-10-CM | POA: Diagnosis not present

## 2017-02-21 DIAGNOSIS — M1990 Unspecified osteoarthritis, unspecified site: Secondary | ICD-10-CM | POA: Diagnosis not present

## 2017-02-21 DIAGNOSIS — R808 Other proteinuria: Secondary | ICD-10-CM | POA: Diagnosis not present

## 2017-02-21 DIAGNOSIS — Z1389 Encounter for screening for other disorder: Secondary | ICD-10-CM | POA: Diagnosis not present

## 2017-02-21 DIAGNOSIS — E7800 Pure hypercholesterolemia, unspecified: Secondary | ICD-10-CM | POA: Diagnosis not present

## 2017-02-21 DIAGNOSIS — I1310 Hypertensive heart and chronic kidney disease without heart failure, with stage 1 through stage 4 chronic kidney disease, or unspecified chronic kidney disease: Secondary | ICD-10-CM | POA: Diagnosis not present

## 2017-02-21 DIAGNOSIS — D472 Monoclonal gammopathy: Secondary | ICD-10-CM | POA: Diagnosis not present

## 2017-02-21 DIAGNOSIS — E1159 Type 2 diabetes mellitus with other circulatory complications: Secondary | ICD-10-CM | POA: Diagnosis not present

## 2017-02-21 DIAGNOSIS — E1129 Type 2 diabetes mellitus with other diabetic kidney complication: Secondary | ICD-10-CM | POA: Diagnosis not present

## 2017-02-21 DIAGNOSIS — Z6831 Body mass index (BMI) 31.0-31.9, adult: Secondary | ICD-10-CM | POA: Diagnosis not present

## 2017-02-21 DIAGNOSIS — N183 Chronic kidney disease, stage 3 (moderate): Secondary | ICD-10-CM | POA: Diagnosis not present

## 2017-02-21 DIAGNOSIS — K219 Gastro-esophageal reflux disease without esophagitis: Secondary | ICD-10-CM | POA: Diagnosis not present

## 2017-02-21 DIAGNOSIS — Z955 Presence of coronary angioplasty implant and graft: Secondary | ICD-10-CM | POA: Diagnosis not present

## 2017-05-30 DIAGNOSIS — Z23 Encounter for immunization: Secondary | ICD-10-CM | POA: Diagnosis not present

## 2017-09-18 DIAGNOSIS — Z125 Encounter for screening for malignant neoplasm of prostate: Secondary | ICD-10-CM | POA: Diagnosis not present

## 2017-09-18 DIAGNOSIS — E1129 Type 2 diabetes mellitus with other diabetic kidney complication: Secondary | ICD-10-CM | POA: Diagnosis not present

## 2017-09-18 DIAGNOSIS — R82998 Other abnormal findings in urine: Secondary | ICD-10-CM | POA: Diagnosis not present

## 2017-09-18 DIAGNOSIS — N183 Chronic kidney disease, stage 3 (moderate): Secondary | ICD-10-CM | POA: Diagnosis not present

## 2017-09-18 DIAGNOSIS — E7800 Pure hypercholesterolemia, unspecified: Secondary | ICD-10-CM | POA: Diagnosis not present

## 2017-09-25 DIAGNOSIS — I1310 Hypertensive heart and chronic kidney disease without heart failure, with stage 1 through stage 4 chronic kidney disease, or unspecified chronic kidney disease: Secondary | ICD-10-CM | POA: Diagnosis not present

## 2017-09-25 DIAGNOSIS — N183 Chronic kidney disease, stage 3 (moderate): Secondary | ICD-10-CM | POA: Diagnosis not present

## 2017-09-25 DIAGNOSIS — E1159 Type 2 diabetes mellitus with other circulatory complications: Secondary | ICD-10-CM | POA: Diagnosis not present

## 2017-09-25 DIAGNOSIS — Z6832 Body mass index (BMI) 32.0-32.9, adult: Secondary | ICD-10-CM | POA: Diagnosis not present

## 2017-09-25 DIAGNOSIS — E7800 Pure hypercholesterolemia, unspecified: Secondary | ICD-10-CM | POA: Diagnosis not present

## 2017-09-25 DIAGNOSIS — Z955 Presence of coronary angioplasty implant and graft: Secondary | ICD-10-CM | POA: Diagnosis not present

## 2017-09-25 DIAGNOSIS — Z0000 Encounter for general adult medical examination without abnormal findings: Secondary | ICD-10-CM | POA: Diagnosis not present

## 2017-09-25 DIAGNOSIS — M109 Gout, unspecified: Secondary | ICD-10-CM | POA: Diagnosis not present

## 2017-09-25 DIAGNOSIS — I10 Essential (primary) hypertension: Secondary | ICD-10-CM | POA: Diagnosis not present

## 2017-09-25 DIAGNOSIS — E1129 Type 2 diabetes mellitus with other diabetic kidney complication: Secondary | ICD-10-CM | POA: Diagnosis not present

## 2017-09-25 DIAGNOSIS — Z1389 Encounter for screening for other disorder: Secondary | ICD-10-CM | POA: Diagnosis not present

## 2017-09-25 DIAGNOSIS — R808 Other proteinuria: Secondary | ICD-10-CM | POA: Diagnosis not present

## 2017-10-04 DIAGNOSIS — Z1212 Encounter for screening for malignant neoplasm of rectum: Secondary | ICD-10-CM | POA: Diagnosis not present

## 2017-11-07 ENCOUNTER — Other Ambulatory Visit: Payer: Self-pay

## 2017-11-07 MED ORDER — NITROGLYCERIN 0.4 MG SL SUBL: SUBLINGUAL_TABLET | SUBLINGUAL | 0 refills | Status: DC

## 2018-04-22 DIAGNOSIS — E668 Other obesity: Secondary | ICD-10-CM | POA: Diagnosis not present

## 2018-04-22 DIAGNOSIS — E1129 Type 2 diabetes mellitus with other diabetic kidney complication: Secondary | ICD-10-CM | POA: Diagnosis not present

## 2018-04-22 DIAGNOSIS — E7800 Pure hypercholesterolemia, unspecified: Secondary | ICD-10-CM | POA: Diagnosis not present

## 2018-04-22 DIAGNOSIS — E1159 Type 2 diabetes mellitus with other circulatory complications: Secondary | ICD-10-CM | POA: Diagnosis not present

## 2018-04-22 DIAGNOSIS — N183 Chronic kidney disease, stage 3 (moderate): Secondary | ICD-10-CM | POA: Diagnosis not present

## 2018-04-22 DIAGNOSIS — R808 Other proteinuria: Secondary | ICD-10-CM | POA: Diagnosis not present

## 2018-04-22 DIAGNOSIS — I10 Essential (primary) hypertension: Secondary | ICD-10-CM | POA: Diagnosis not present

## 2018-04-22 DIAGNOSIS — Z955 Presence of coronary angioplasty implant and graft: Secondary | ICD-10-CM | POA: Diagnosis not present

## 2018-04-22 DIAGNOSIS — Z6831 Body mass index (BMI) 31.0-31.9, adult: Secondary | ICD-10-CM | POA: Diagnosis not present

## 2018-04-22 DIAGNOSIS — R413 Other amnesia: Secondary | ICD-10-CM | POA: Diagnosis not present

## 2018-04-22 DIAGNOSIS — D472 Monoclonal gammopathy: Secondary | ICD-10-CM | POA: Diagnosis not present

## 2018-04-22 DIAGNOSIS — I1310 Hypertensive heart and chronic kidney disease without heart failure, with stage 1 through stage 4 chronic kidney disease, or unspecified chronic kidney disease: Secondary | ICD-10-CM | POA: Diagnosis not present

## 2018-04-24 ENCOUNTER — Other Ambulatory Visit: Payer: Self-pay | Admitting: Internal Medicine

## 2018-04-24 DIAGNOSIS — R413 Other amnesia: Secondary | ICD-10-CM

## 2018-04-25 ENCOUNTER — Other Ambulatory Visit: Payer: PPO

## 2018-05-06 DIAGNOSIS — R808 Other proteinuria: Secondary | ICD-10-CM | POA: Diagnosis not present

## 2018-05-06 DIAGNOSIS — D472 Monoclonal gammopathy: Secondary | ICD-10-CM | POA: Diagnosis not present

## 2018-05-06 DIAGNOSIS — Z6831 Body mass index (BMI) 31.0-31.9, adult: Secondary | ICD-10-CM | POA: Diagnosis not present

## 2018-05-06 DIAGNOSIS — N183 Chronic kidney disease, stage 3 (moderate): Secondary | ICD-10-CM | POA: Diagnosis not present

## 2018-05-06 DIAGNOSIS — Z23 Encounter for immunization: Secondary | ICD-10-CM | POA: Diagnosis not present

## 2018-05-06 DIAGNOSIS — I952 Hypotension due to drugs: Secondary | ICD-10-CM | POA: Diagnosis not present

## 2018-05-06 DIAGNOSIS — R413 Other amnesia: Secondary | ICD-10-CM | POA: Diagnosis not present

## 2018-05-06 DIAGNOSIS — I1310 Hypertensive heart and chronic kidney disease without heart failure, with stage 1 through stage 4 chronic kidney disease, or unspecified chronic kidney disease: Secondary | ICD-10-CM | POA: Diagnosis not present

## 2018-05-06 DIAGNOSIS — I10 Essential (primary) hypertension: Secondary | ICD-10-CM | POA: Diagnosis not present

## 2018-06-12 ENCOUNTER — Ambulatory Visit (INDEPENDENT_AMBULATORY_CARE_PROVIDER_SITE_OTHER): Payer: PPO | Admitting: Neurology

## 2018-06-12 ENCOUNTER — Encounter: Payer: Self-pay | Admitting: Neurology

## 2018-06-12 ENCOUNTER — Encounter

## 2018-06-12 ENCOUNTER — Telehealth: Payer: Self-pay | Admitting: Neurology

## 2018-06-12 VITALS — BP 150/77 | HR 65 | Ht 67.0 in | Wt 217.0 lb

## 2018-06-12 DIAGNOSIS — G9340 Encephalopathy, unspecified: Secondary | ICD-10-CM | POA: Diagnosis not present

## 2018-06-12 DIAGNOSIS — R413 Other amnesia: Secondary | ICD-10-CM | POA: Diagnosis not present

## 2018-06-12 NOTE — Telephone Encounter (Signed)
Health team order sent to GI. No auth they will reach out to the pt to schedule.  °
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
## 2018-06-12 NOTE — Patient Instructions (Addendum)
Memory Loss: Improve your short term memory By [place] from [facility]   MRI brain Formal neurocognitive testing  Memory Compensation Strategies  1. Use "WARM" strategy.  W= write it down  A= associate it  R= repeat it  M= make a mental note  2.   You can keep a Social worker.  Use a 3-ring notebook with sections for the following: calendar, important names and phone numbers,  medications, doctors' names/phone numbers, lists/reminders, and a section to journal what you did  each day.   3.    Use a calendar to write appointments down.  4.    Write yourself a schedule for the day.  This can be placed on the calendar or in a separate section of the Memory Notebook.  Keeping a  regular schedule can help memory.  5.    Use medication organizer with sections for each day or morning/evening pills.  You may need help loading it  6.    Keep a basket, or pegboard by the door.  Place items that you need to take out with you in the basket or on the pegboard.  You may also want to  include a message board for reminders.  7.    Use sticky notes.  Place sticky notes with reminders in a place where the task is performed.  For example: " turn off the  stove" placed by the stove, "lock the door" placed on the door at eye level, " take your medications" on  the bathroom mirror or by the place where you normally take your medications.  8.    Use alarms/timers.  Use while cooking to remind yourself to check on food or as a reminder to take your medicine, or as a  reminder to make a call, or as a reminder to perform another task, etc.  Recommendations to prevent or slow progression of cognitive decline:   Exercise You should increase exercise 30 to 45 minutes per day at least 3 days a week although 5 to 7 would be preferred. Any type of exercise (including walking) is acceptable although a recumbent bicycle may be best if you are unsteady. Disease related apathy can be a  significant roadblock to exercise and the only way to overcome this is to make it a daily routine and perhaps have a reward at the end (something your loved one loves to eat or drink perhaps) or a personal trainer coming to the home can also be very useful. In general a structured, repetitive schedule is best.   Cardiovascular Health: You should optimize all cardiovascular risk factors (blood pressure, sugar, cholesterol) as vascular disease such as strokes and heart attacks can make memory problems much worse.   Diet: Eating a heart healthy (Mediterranean) diet is also a good idea; fish and poultry instead of red meat, nuts (mostly non-peanuts), vegetables, fruits, olive oil or canola oil (instead of butter), minimal salt (use other spices to flavor foods), whole grain rice, bread, cereal and pasta and wine in moderation.  General Health: Any diseases which effect your body will effect your brain such as a pneumonia, urinary infection, blood clot, heart attack or stroke. Keep contact with your primary care doctor for regular follow ups.  Sleep. A good nights sleep is healthy for the brain. Seven hours is recommended. If you have insomnia or poor sleep habits see the recommendations below  Tips: Structured and consistent daytime and nighttime routine, including regular wake times, bedtimes, and mealtimes,  will be important for the patient to avoid confusion. Keeping frequently used items in designated places will help reduce stress from searching. If there are worries about getting lost do not let the patient leave home unaccompanied. They might benefit from wearing an identification bracelet that will help others assist in finding home if they become lost. Information about nationwide safe return services and other helpful resources may be obtained through the Alzheimer's Association helpline at [phone].  Finances, Power of Producer, television/film/video Directives: You should consider putting legal  safeguards in place with regard to financial and medical decision making. While the spouse always has power of attorney for medical and financial issues in the absence of any form, you should consider what you want in case the spouse / caregiver is no longer around or capable of making decisions.   [place] : http://www.welch.com/.pdf  Or Google "[place]" AND "An Forensic scientist for [employer]  Other States: ApartmentMom.com.ee  The signature on these forms should be notarized.   DRIVING:   Driving only during the day Drive only to familiar Locations Avoid driving during bad weather  If you would like to be tested to see if you are driving safely, [facility] has a Clinical Driving Evaluation. To schedule an appointment call [phone].                RESOURCES:  Memory Loss: Improve your short term memory By Silvio Pate  The Alzheimer's Reading Room http://www.alzheimersreadingroom.com/   The Alzheimer's Compendium http://www.alzcompend.info/  [employer] www.dukefamilysupport.EPP 913-015-6226  Recommended resources for caregivers (All can be purchased on [employer]):  1) A Caregiver's Guide to Dementia: Using Activities and Other Strategies to Prevent, Reduce and Manage Behavioral Symptoms by Osie Bond. Tyler Aas and [employer]   2) A Caregiver's Guide to [employer] Dementia by Caleen Essex MS BSN and Gaston Islam   3) What If It's Not Alzheimer's?: A Caregiver's Guide to Dementia by Koren Shiver (Author), Octaviano Batty (Editor)  3) The 36 hour day by Rabins and Mace  4) Understanding Difficult Behaviors by Merita Norton and White  Online course for helping caregivers reduce stress, guilt and frustration called the Caregivers Helpbook. The website is www.powerfultoolsforcaregivers.org  As a  caregiver you are a Art gallery manager. Problems you face as a caregiver are usually unique to your situation and the way your loved-one's disease manifests itself. The best way to use these books is to look at the Table of Contents and read any chapters of interest or that apply to challenges you are having as a caregiver.  NATIONAL RESOURCES: For more information on neurological disorders or research programs funded by the [employer] of Neurological Disorders and Stroke, contact the Institute's Agricultural consultant (BRAIN) at: [place] [address] [phone] (toll-free) MasterBoxes.it  Information on dementia is also available from the following organizations: Alzheimer's Disease Education and Referral (Avera) [place] on Aging [address] [place] [phone] (toll-free) DVDEnthusiasts.nl  Alzheimer's Association [address] [place] [address] [phone] (toll-free, 24-hour helpline) [phone] (TDD) CapitalMile.co.nz  Alzheimer's Foundation of America [address], [place] [address] [phone] (toll-free) www.alzfdn.org  [facility] [address], [place] [address] [phone] www.alzdiscovery.org  Association for [place] [address] [place] [address] [phone] (toll-free) www.theaftd.[employer]  [facility] [place], [place]  [address] [phone] (toll-free) www.brightfocus.org/alzheimers  Doran Stabler [facility] Alzheimer's Foundation [address], [place] [address] [phone] www.https://lambert-jackson.net/  Lewy Body Dementia Association [address] [place] [address] [phone] (toll-free LBD Caregiver Link) www.lbda.[place], [place], [place] [address] [phone] (toll-free) [phone] [facility]) https://carter.com/  National Organization for Rare Disorders [address] [place] [address] 8-833-744-ZHQU [phone]) (toll-free) www.rarediseases.org  The Dementias: Hope Through Research was jointly produced by the [employer] of Neurological Disorders and Stroke (NINDS) and the [employer] on Aging (NIA), both part of the [employer], the [employer] research agency-supporting scientific studies that turn discovery into health. NINDS is the nation's leading funder of research on the brain and nervous system. The NINDS mission is to reduce the burden of neurological disease. For more information and resources, visit MasterBoxes.it [1] or call 302-290-8120. NIA leads the federal government effort conducting and supporting research on aging and the health and well-being of older people. NIA's Alzheimer's Disease Education and Referral (ADEAR) Center offers information and publications on dementia and caregiving for families, caregivers, and professionals. For more information, visit DVDEnthusiasts.nl [2] or call [phone]. Also available from NIA are publications and information about Alzheimer's disease as well as the booklets Frontotemporal Disorders: Information for Patients, Families, and Caregivers and Lewy Body Dementia: Information for Patients, Families, and Professionals. Source URL: SocialSpecialists.co.nz

## 2018-06-20 ENCOUNTER — Encounter: Payer: Self-pay | Admitting: Psychology

## 2018-06-26 ENCOUNTER — Ambulatory Visit
Admission: RE | Admit: 2018-06-26 | Discharge: 2018-06-26 | Disposition: A | Discharge status: Home / Self Care | Payer: PPO | Source: Ambulatory Visit | Attending: Neurology | Admitting: Neurology

## 2018-06-26 DIAGNOSIS — R413 Other amnesia: Secondary | ICD-10-CM

## 2018-06-26 DIAGNOSIS — G9340 Encephalopathy, unspecified: Secondary | ICD-10-CM | POA: Diagnosis not present

## 2018-07-03 ENCOUNTER — Telehealth: Payer: Self-pay | Admitting: *Deleted

## 2018-07-03 NOTE — Telephone Encounter (Signed)
-----   Message from Melvenia Beam, MD sent at 06/30/2018 11:55 AM EDT ----- The MRI of the brain is not significantly changed from 2014. There is atrophy (normal loss of brain volume due to aging). This is great news. Unfortunately this does not rule out dementia but it is reassuring. The next step is to proceed with the formal memory testing we discussed and ordered.

## 2018-07-03 NOTE — Telephone Encounter (Signed)
Called pt on home # shared with wife and LVM asking for call back. Left office number in message.

## 2018-07-04 NOTE — Telephone Encounter (Signed)
Spoke with the patient and discussed that his MRI of the brain is not significantly changed from 2014. There is atrophy (normal loss of brain volume due to aging). This is great news. Unfortunately this does not rule out dementia but it is reassuring. The next step is to proceed with the formal memory testing that was discussed and ordered. The patient verbalized appreciation and understanding. He stated that his appt is 09/07/18 and he will follow through with the testing. He will plan to f/u with Dr. Jaynee Eagles in April 2020. He wanted to make sure Dr. Jaynee Eagles knew that he was abiding by her wishes and he is not driving at night and he does not talk while driving. He had no concerns or questions at this time.

## 2018-09-09 ENCOUNTER — Encounter: Payer: PPO | Attending: Psychology | Admitting: Psychology

## 2018-09-09 ENCOUNTER — Encounter

## 2018-09-09 DIAGNOSIS — R413 Other amnesia: Secondary | ICD-10-CM | POA: Diagnosis not present

## 2018-09-24 DIAGNOSIS — E1129 Type 2 diabetes mellitus with other diabetic kidney complication: Secondary | ICD-10-CM | POA: Diagnosis not present

## 2018-09-24 DIAGNOSIS — N183 Chronic kidney disease, stage 3 (moderate): Secondary | ICD-10-CM | POA: Diagnosis not present

## 2018-09-24 DIAGNOSIS — Z125 Encounter for screening for malignant neoplasm of prostate: Secondary | ICD-10-CM | POA: Diagnosis not present

## 2018-09-24 DIAGNOSIS — E7800 Pure hypercholesterolemia, unspecified: Secondary | ICD-10-CM | POA: Diagnosis not present

## 2018-09-24 DIAGNOSIS — R82998 Other abnormal findings in urine: Secondary | ICD-10-CM | POA: Diagnosis not present

## 2018-10-01 DIAGNOSIS — Z0000 Encounter for general adult medical examination without abnormal findings: Secondary | ICD-10-CM | POA: Diagnosis not present

## 2018-10-01 DIAGNOSIS — E1159 Type 2 diabetes mellitus with other circulatory complications: Secondary | ICD-10-CM | POA: Diagnosis not present

## 2018-10-01 DIAGNOSIS — Z1389 Encounter for screening for other disorder: Secondary | ICD-10-CM | POA: Diagnosis not present

## 2018-10-01 DIAGNOSIS — N183 Chronic kidney disease, stage 3 (moderate): Secondary | ICD-10-CM | POA: Diagnosis not present

## 2018-10-01 DIAGNOSIS — Z6831 Body mass index (BMI) 31.0-31.9, adult: Secondary | ICD-10-CM | POA: Diagnosis not present

## 2018-10-01 DIAGNOSIS — R413 Other amnesia: Secondary | ICD-10-CM | POA: Diagnosis not present

## 2018-10-01 DIAGNOSIS — Z1331 Encounter for screening for depression: Secondary | ICD-10-CM | POA: Diagnosis not present

## 2018-10-01 DIAGNOSIS — I25810 Atherosclerosis of coronary artery bypass graft(s) without angina pectoris: Secondary | ICD-10-CM | POA: Diagnosis not present

## 2018-10-01 DIAGNOSIS — Z1212 Encounter for screening for malignant neoplasm of rectum: Secondary | ICD-10-CM | POA: Diagnosis not present

## 2018-10-01 DIAGNOSIS — E7800 Pure hypercholesterolemia, unspecified: Secondary | ICD-10-CM | POA: Diagnosis not present

## 2018-10-01 DIAGNOSIS — I1310 Hypertensive heart and chronic kidney disease without heart failure, with stage 1 through stage 4 chronic kidney disease, or unspecified chronic kidney disease: Secondary | ICD-10-CM | POA: Diagnosis not present

## 2018-10-01 DIAGNOSIS — R808 Other proteinuria: Secondary | ICD-10-CM | POA: Diagnosis not present

## 2018-10-01 DIAGNOSIS — E119 Type 2 diabetes mellitus without complications: Secondary | ICD-10-CM | POA: Diagnosis not present

## 2018-10-01 DIAGNOSIS — I10 Essential (primary) hypertension: Secondary | ICD-10-CM | POA: Diagnosis not present

## 2018-10-03 ENCOUNTER — Encounter: Payer: Self-pay | Admitting: Psychology

## 2018-10-03 ENCOUNTER — Encounter: Payer: PPO | Attending: Psychology | Admitting: Psychology

## 2018-10-03 DIAGNOSIS — R413 Other amnesia: Secondary | ICD-10-CM

## 2018-10-03 NOTE — Progress Notes (Signed)
BEHAVIORAL OBSERVATIONS: Patient was on time to his 8:00am testing appointment. He was administered the Wechsler Adult Intelligence Scale, 4th Edition. The evaluation lasted around 120 minutes. His participation was indicative of appropriate and redirectable behaviors. He was appropriately dressed and well groomed. He displayed an appropriate level of cooperation and motivation. He ambulated without assistance but displayed shuffling gait and reduced balance. Mood was euthymic. Affect was appropriate and congruent with mood. Thought processes appeared linear and goal directed. Insight and judgment were grossly intact.   Results of the WAIS-IV are as follows:  Composite Score Summary  Scale Sum of Scaled Scores Composite Score Percentile Rank 95% Conf. Interval Qualitative Description  Verbal Comprehension 44 VCI 127 96 120-132 Superior  Perceptual Reasoning 43 PRI 125 95 118-130 Superior  Working Memory 28 WMI 122 93 114-127 Superior  Processing Speed 19 PSI 97 42 89-106 Average  Full Scale 134 FSIQ 124 95 119-128 Superior  General Ability 87 GAI 130 98 124-134 Very Superior   ANALYSIS   Index Level Discrepancy Comparisons  Comparison Score 1 Score 2 Difference Critical Value .05 Significant Difference Y/N Base Rate by Overall Sample  VCI - PSI 127 97 30 9.29 Y 3.5  PRI - PSI 125 97 28 11.38 Y 3.3  WMI - PSI 122 97 25 11.38 Y 6.0  FSIQ - GAI 124 130 -6 3.44 Y 12.4   DETERMINING STRENGTHS AND WEAKNESSES   Differences Between Subtest and Overall Mean of Subtest Scores  Subtest Subtest Scaled Score Mean Scaled Score Difference Critical Value .05 Strength or Weakness Base Rate  Similarities 18 13.40 4.60 2.82 S 1-2%  Visual Puzzles 19 13.40 5.60 2.71 S <1%  Coding 9 13.40 -4.40 2.97 W 5-10%  Overall: Mean = 13.40, Scatter =  10, Base rate =  8.9 Base Rate for Intersubtest Scatter is reported for 10 Subtests. Statistical significance (critical value) at the .05 level.

## 2018-10-09 ENCOUNTER — Encounter: Payer: Self-pay | Admitting: Psychology
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
## 2018-10-10 ENCOUNTER — Encounter: Payer: PPO | Admitting: Psychology

## 2018-10-31 ENCOUNTER — Encounter: Payer: PPO | Admitting: Psychology

## 2018-11-07 ENCOUNTER — Encounter: Payer: Self-pay | Admitting: Psychology

## 2018-11-07 ENCOUNTER — Encounter: Payer: PPO | Admitting: Psychology

## 2018-11-07 ENCOUNTER — Encounter: Payer: PPO | Attending: Psychology | Admitting: Psychology

## 2018-11-07 DIAGNOSIS — R413 Other amnesia: Secondary | ICD-10-CM | POA: Insufficient documentation

## 2018-11-07 NOTE — Progress Notes (Signed)
BEHAVIORAL OBSERVATIONS: Patient was on time to his 1:00pm testing appointment. He was administered the [employer] Scale, 4th Edition (Older Adult Battery) and [place] (BNT). The evaluation lasted around 120 minutes. He was alert and fully oriented. He presented as appropriately dressed and well groomed. He displayed an appropriate level of cooperation and motivation. He ambulated without assistance but displayed shuffling gait and reduced balance. Mood was euthymic. Affect was appropriate and congruent with mood. Thought processes appeared linear and goal directed. Insight and judgment were good.   Results of the WMS-IV (Older Adult Battery) are as follows:  Brief Cognitive Status Exam Classification  Age Years of Education Raw Score Classification Level Base Rate  87 years 11 months 16 52 Average 100.0   Index Score Summary  Index Sum of Scaled Scores Index Score Percentile Rank 95% Confidence Interval Qualitative Descriptor  Auditory Memory (AMI) 43 104 61 98-110 Average  Visual Memory (VMI) 23 108 70 103-113 Average  Immediate Memory (IMI) 34 108 70 101-114 Average  Delayed Memory (DMI) 32 104 61 96-111 Average   Primary Subtest Scaled Score Summary  Subtest Domain Raw Score Scaled Score Percentile Rank  Logical Memory I AM 33 13 84  Logical Memory II AM 17 12 75  Verbal [employer] I AM 14 9 37  Verbal [facility] II AM 4 9 37  Visual Reproduction I VM 27 12 75  Visual Reproduction II VM 14 11 63  Symbol Span VWM 13 11 63   PROCESS SCORE CONVERSIONS  Auditory Memory Process Score Summary  Process Score Raw Score Scaled Score Percentile Rank Cumulative Percentage (Base Rate)  LM II Recognition 17 - - 26-50%  VPA II Recognition 29 - - >75%   Visual Memory Process Score Summary  Process Score Raw Score Scaled Score Percentile Rank Cumulative Percentage (Base Rate)  VR II Recognition 5 - - >75%   Results of the [place] (BNT) are as  follows:   Marland Kitchen Total # correct without cue=58/60 . Total # correct with stimulus cue=59/60

## 2018-11-24 ENCOUNTER — Encounter: Payer: PPO | Attending: Psychology | Admitting: Psychology

## 2018-11-24 ENCOUNTER — Encounter: Payer: Self-pay | Admitting: Psychology

## 2018-11-24 ENCOUNTER — Ambulatory Visit: Payer: PPO | Admitting: Psychology

## 2018-11-24 ENCOUNTER — Other Ambulatory Visit: Payer: Self-pay

## 2018-11-24 DIAGNOSIS — Z951 Presence of aortocoronary bypass graft: Secondary | ICD-10-CM | POA: Insufficient documentation

## 2018-11-24 DIAGNOSIS — Z8546 Personal history of malignant neoplasm of prostate: Secondary | ICD-10-CM | POA: Insufficient documentation

## 2018-11-24 DIAGNOSIS — E785 Hyperlipidemia, unspecified: Secondary | ICD-10-CM | POA: Insufficient documentation

## 2018-11-24 DIAGNOSIS — Z87891 Personal history of nicotine dependence: Secondary | ICD-10-CM | POA: Diagnosis not present

## 2018-11-24 DIAGNOSIS — I129 Hypertensive chronic kidney disease with stage 1 through stage 4 chronic kidney disease, or unspecified chronic kidney disease: Secondary | ICD-10-CM | POA: Insufficient documentation

## 2018-11-24 DIAGNOSIS — N184 Chronic kidney disease, stage 4 (severe): Secondary | ICD-10-CM | POA: Diagnosis not present

## 2018-11-24 DIAGNOSIS — E1122 Type 2 diabetes mellitus with diabetic chronic kidney disease: Secondary | ICD-10-CM | POA: Insufficient documentation

## 2018-11-24 DIAGNOSIS — R4189 Other symptoms and signs involving cognitive functions and awareness: Secondary | ICD-10-CM

## 2018-11-24 DIAGNOSIS — R413 Other amnesia: Secondary | ICD-10-CM | POA: Insufficient documentation

## 2018-11-24 NOTE — Progress Notes (Signed)
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 vs. Delayed Recall 26-50% 12 14  LM Immediate Recall vs. Delayed Recall 13 12 9    Verbal Paired Associates  Score Score 1 Score 2 Contrast Scaled Score  VPA II Recognition vs. Delayed Recall >75% 9 7  VPA Immediate Recall vs. Delayed Recall 9 9 10    Visual Reproduction  Score Score 1 Score 2 Contrast Scaled Score  VR II Recognition vs. Delayed Recall >75% 11 10  VR Immediate Recall vs. Delayed Recall 12 11 10     INDEX-LEVEL CONTRAST SCALED SCORES  WMS-IV Indexes  Score Score 1 Score 2 Contrast Scaled Score  Auditory Memory Index vs. Visual Memory Index 104 108 11  Immediate Memory Index vs. Delayed Memory Index 108 104 9    RAW SCORES  Subtest Score Range Adult Score Range Older Adult Raw Score  Brief Cognitive Status Exam  0-58 0-58 52  Logical Memory I 0-50 0-53 33  Logical Memory II 0-50 0-39 17  Verbal Paired Associates I 0-56 0-40 14  Verbal Paired Associates II 0-14 0-10 4  CVLT-II Trials 1-5 5-95 5-95   CVLT-II Long-Delay -5-5 -5-5   Designs I 0-120    Designs II 0-120    Visual Reproduction I 0-43 0-43 27  Visual Reproduction II 0-43 0-43 14  Spatial Addition 0-24    Symbol Span 0-50 0-50 13  Process Score Range Adult Score Range Older Adult Raw Score  LM II Recognition 0-30 0-23 17  VPA II Recognition 0-40 0-30 29  VPA II Word Recall 0-28 0-20   DE I Content 0-48    DE I Spatial 0-24    DE II Content 0-48    DE II Spatial 0-24    DE II Recognition 0-24    VR II Recognition 0-7 0-7 5  VR II Copy 0-43 0-43     ABILITY-MEMORY ANALYSIS  Ability Score:  GAI: 130 Date of Testing:  WAIS-IV 2018/10/03; WMS-IV 2018/11/07  Predicted Difference Method   Index Predicted WMS-IV Index Score Actual WMS-IV Index Score Difference Critical Value  Significant Difference Y/N Base Rate  Auditory Memory 116 104 12 9.16 Y 15-20%  Visual Memory 118 108 10 7.68 Y 20-25%  Immediate Memory 120 108 12 8.63 Y 15%  Delayed Memory 117 104 13 11.39 Y 15-20%  Statistical significance (critical value) at the .01 level.   Contrast Scaled Scores  Score Score 1 Score 2 Contrast Scaled Score  General Ability Index vs. Auditory Memory Index 130 104 8  General Ability Index vs. Visual Memory Index 130 108 8  General Ability Index vs. Immediate Memory Index 130 108 8  General Ability Index vs. Delayed Memory Index 130 104 7  Verbal Comprehension Index vs. Auditory Memory Index 127 104 8  Perceptual Reasoning Index vs. Visual Memory Index 125 108 8  Working Memory Index vs. Auditory Memory Index 133 104 9    End of Report

## 2018-12-01 ENCOUNTER — Encounter: Payer: PPO | Admitting: Psychology

## 2018-12-15 ENCOUNTER — Ambulatory Visit: Payer: PPO | Admitting: Neurology

## 2019-04-10 DIAGNOSIS — I1310 Hypertensive heart and chronic kidney disease without heart failure, with stage 1 through stage 4 chronic kidney disease, or unspecified chronic kidney disease: Secondary | ICD-10-CM | POA: Diagnosis not present

## 2019-04-10 DIAGNOSIS — E1159 Type 2 diabetes mellitus with other circulatory complications: Secondary | ICD-10-CM | POA: Diagnosis not present

## 2019-04-10 DIAGNOSIS — Z955 Presence of coronary angioplasty implant and graft: Secondary | ICD-10-CM | POA: Diagnosis not present

## 2019-04-10 DIAGNOSIS — R809 Proteinuria, unspecified: Secondary | ICD-10-CM | POA: Diagnosis not present

## 2019-04-10 DIAGNOSIS — I25810 Atherosclerosis of coronary artery bypass graft(s) without angina pectoris: Secondary | ICD-10-CM | POA: Diagnosis not present

## 2019-04-10 DIAGNOSIS — E1129 Type 2 diabetes mellitus with other diabetic kidney complication: Secondary | ICD-10-CM | POA: Diagnosis not present

## 2019-04-10 DIAGNOSIS — E7800 Pure hypercholesterolemia, unspecified: Secondary | ICD-10-CM | POA: Diagnosis not present

## 2019-04-10 DIAGNOSIS — N183 Chronic kidney disease, stage 3 (moderate): Secondary | ICD-10-CM | POA: Diagnosis not present

## 2019-04-13 DIAGNOSIS — E1129 Type 2 diabetes mellitus with other diabetic kidney complication: Secondary | ICD-10-CM | POA: Diagnosis not present

## 2019-04-13 DIAGNOSIS — I1310 Hypertensive heart and chronic kidney disease without heart failure, with stage 1 through stage 4 chronic kidney disease, or unspecified chronic kidney disease: Secondary | ICD-10-CM | POA: Diagnosis not present

## 2019-06-05 DIAGNOSIS — Z23 Encounter for immunization: Secondary | ICD-10-CM | POA: Diagnosis not present

## 2019-06-25 DIAGNOSIS — H353131 Nonexudative age-related macular degeneration, bilateral, early dry stage: Secondary | ICD-10-CM | POA: Diagnosis not present

## 2019-06-25 DIAGNOSIS — H43813 Vitreous degeneration, bilateral: Secondary | ICD-10-CM | POA: Diagnosis not present

## 2019-06-25 DIAGNOSIS — Z961 Presence of intraocular lens: Secondary | ICD-10-CM | POA: Diagnosis not present

## 2019-06-25 DIAGNOSIS — E119 Type 2 diabetes mellitus without complications: Secondary | ICD-10-CM | POA: Diagnosis not present

## 2019-07-10 NOTE — Progress Notes (Signed)
[place] Date of Birth: [birth year]   History of Present Illness: Tony Lee is seen today for followup of CAD. Last seen in March 2018. He has a history of coronary disease and is status post coronary bypass surgery in October of 2008. Myoview study in December 2014 showed a slight scar at the base of the inferolateral wall. No ischemia. EF 62%. He has DM, dyslipidemia, and chronic RBBB.   On follow up today he is doing OK. He lives at [facility]. Walks daily. No chest pain. Rare pulsation in left chest usually after eating something heavy. Some brief palpitations lasting seconds. Nothing sustained. Sometimes has to stop and rest on his walk. He is losing weight. Down 9 lbs in 2 years.   Current Outpatient Medications on File Prior to Visit  Medication Sig Dispense Refill  . aspirin 325 MG tablet Take 325 mg by mouth daily.      . Choline Fenofibrate (FENOFIBRIC ACID) 45 MG CPDR Take by mouth.    . Cyanocobalamin (VITAMIN B-12 PO) Take 1 tablet by mouth daily.    Marland Kitchen losartan (COZAAR) 100 MG tablet Take 25 mg by mouth daily.     . metoprolol succinate (TOPROL-XL) 25 MG 24 hr tablet Take 25 mg by mouth daily.  3  . metoprolol tartrate (LOPRESSOR) 25 MG tablet     . nitroGLYCERIN (NITROSTAT) 0.4 MG SL tablet PLACE 1 TABLET (0.4 MG TOTAL) UNDER THE TONGUE EVERY 5 (FIVE) MINUTES AS NEEDED. 25 tablet 0  . omeprazole (PRILOSEC) 40 MG capsule Take 40 mg by mouth daily.     . simvastatin (ZOCOR) 20 MG tablet Take 20 mg by mouth at bedtime.      . TRADJENTA 5 MG TABS tablet Take 5 mg by mouth daily. Take 1 tab daily  6  . Turmeric (QC TUMERIC COMPLEX) 500 MG CAPS Take 500 mg by mouth.     No current facility-administered medications on file prior to visit.     No Known Allergies  Past Medical History:  Diagnosis Date  . Cancer [facility]) 2004   prostate  . Coronary artery disease   . Diabetes mellitus type 2 in obese ([place])   . Dyslipidemia   . Esophageal stricture   . Monoclonal  paraproteinemia 03/03/2013  . Palpitations   . RBBB (right bundle branch block)     Past Surgical History:  Procedure Laterality Date  . ANKLE SURGERY Left   . APPENDECTOMY    . CARDIAC CATHETERIZATION     Dr. Martinique  . CATARACT EXTRACTION W/ INTRAOCULAR LENS  IMPLANT, BILATERAL    . CORONARY ARTERY BYPASS GRAFT  Oct. 2008   x 5,Dr.Gerhardt  . HERNIA REPAIR Bilateral   . PROSTATECTOMY  2004   radical  . right shoulder      replacement  . vein strippping      Social History   Tobacco Use  Smoking Status Former Smoker  . Packs/day: 0.50  . Years: 20.00  . Pack years: 10.00  . Quit date: 09/11/1967  . Years since quitting: 51.8  Smokeless Tobacco Never Used  Tobacco Comment   quit 45 years ago    Social History   Substance and Sexual Activity  Alcohol Use Yes   Comment: social, 1-2 times per week     Family History  Problem Relation Age of Onset  . Heart failure Father        age 66  . Heart attack Brother  age 71 post third open heart surgery  . Heart attack Brother     Review of Systems: As noted in history of present illness..  All other systems were reviewed and are negative.  Physical Exam: BP (!) 143/79   Pulse 69   Ht 5\' 5"  (1.651 m)   Wt 209 lb (94.8 kg)   SpO2 96%   BMI 34.78 kg/m  He is an obese white male in no acute distress. The HEENT exam is normal. The carotids are 2+ without bruits.  There is no thyromegaly.  There is no JVD.  The lungs are clear.   He has a median sternotomy scar. The heart exam reveals a regular rate with a normal S1 and S2.  There are no murmurs, gallops, or rubs.  The PMI is not displaced.   Abdominal exam reveals good bowel sounds.   There is no hepatosplenomegaly or tenderness.  There are no masses.  Exam of the legs reveal no clubbing, cyanosis, or edema.  All pulses are good.  Cranial nerves II - XII are intact.   LABORATORY DATA: ECG today demonstrates normal sinus rhythm with first degree AV block. Right  bundle branch block..  Rate 69. I have personally reviewed and interpreted this study.  Labs dated 04/13/19: A1c 6.5%. cholesterol 123, triglycerides 88, HDL 36, LDL 69. Creatinine 1.7. otherwise CMET and CBC normal   Assessment / Plan: 1. Coronary disease status post CABG in 2008. Low risk Myoview in December 2014. He has no significant angina.  We'll continue with his current therapy including aspirin, metoprolol, and statin therapy.  2. Diabetes mellitus type 2- continue medical therapy per primary care. On Tradjenta.   3. Right bundle branch block, chronic  4. Dyslipidemia. On simvastatin and fenofibrate. Good control.

## 2019-07-14 ENCOUNTER — Ambulatory Visit: Payer: PPO | Admitting: Cardiology

## 2019-07-14 ENCOUNTER — Other Ambulatory Visit: Payer: Self-pay

## 2019-07-14 ENCOUNTER — Encounter: Payer: Self-pay | Admitting: Cardiology

## 2019-07-14 VITALS — BP 143/79 | HR 69 | Ht 65.0 in | Wt 209.0 lb

## 2019-07-14 DIAGNOSIS — E7800 Pure hypercholesterolemia, unspecified: Secondary | ICD-10-CM

## 2019-07-14 DIAGNOSIS — I4510 Unspecified right bundle-branch block: Secondary | ICD-10-CM | POA: Diagnosis not present

## 2019-07-14 DIAGNOSIS — I25708 Atherosclerosis of coronary artery bypass graft(s), unspecified, with other forms of angina pectoris: Secondary | ICD-10-CM | POA: Diagnosis not present

## 2019-07-14 NOTE — Addendum Note (Signed)
Addended by: Kathyrn Lass on: 07/14/2019 08:28 AM   Modules accepted: Orders

## 2019-07-22 ENCOUNTER — Other Ambulatory Visit: Payer: Self-pay | Admitting: Internal Medicine

## 2019-07-22 DIAGNOSIS — R413 Other amnesia: Secondary | ICD-10-CM

## 2019-10-01 ENCOUNTER — Encounter: Payer: Self-pay | Admitting: Cardiology

## 2019-10-01 DIAGNOSIS — Z125 Encounter for screening for malignant neoplasm of prostate: Secondary | ICD-10-CM | POA: Diagnosis not present

## 2019-10-01 DIAGNOSIS — E1129 Type 2 diabetes mellitus with other diabetic kidney complication: Secondary | ICD-10-CM | POA: Diagnosis not present

## 2019-10-01 DIAGNOSIS — E7800 Pure hypercholesterolemia, unspecified: Secondary | ICD-10-CM | POA: Diagnosis not present

## 2019-10-02 DIAGNOSIS — E7800 Pure hypercholesterolemia, unspecified: Secondary | ICD-10-CM | POA: Diagnosis not present

## 2019-10-02 DIAGNOSIS — R82998 Other abnormal findings in urine: Secondary | ICD-10-CM | POA: Diagnosis not present

## 2019-10-08 DIAGNOSIS — Z1339 Encounter for screening examination for other mental health and behavioral disorders: Secondary | ICD-10-CM | POA: Diagnosis not present

## 2019-10-08 DIAGNOSIS — I4510 Unspecified right bundle-branch block: Secondary | ICD-10-CM | POA: Diagnosis not present

## 2019-10-08 DIAGNOSIS — Z1331 Encounter for screening for depression: Secondary | ICD-10-CM | POA: Diagnosis not present

## 2019-10-08 DIAGNOSIS — Z0000 Encounter for general adult medical examination without abnormal findings: Secondary | ICD-10-CM | POA: Diagnosis not present

## 2019-10-08 DIAGNOSIS — E1129 Type 2 diabetes mellitus with other diabetic kidney complication: Secondary | ICD-10-CM | POA: Diagnosis not present

## 2019-10-08 DIAGNOSIS — N1832 Chronic kidney disease, stage 3b: Secondary | ICD-10-CM | POA: Diagnosis not present

## 2019-10-08 DIAGNOSIS — Z8546 Personal history of malignant neoplasm of prostate: Secondary | ICD-10-CM | POA: Diagnosis not present

## 2019-10-08 DIAGNOSIS — I25810 Atherosclerosis of coronary artery bypass graft(s) without angina pectoris: Secondary | ICD-10-CM | POA: Diagnosis not present

## 2019-10-08 DIAGNOSIS — D472 Monoclonal gammopathy: Secondary | ICD-10-CM | POA: Diagnosis not present

## 2019-10-08 DIAGNOSIS — R809 Proteinuria, unspecified: Secondary | ICD-10-CM | POA: Diagnosis not present

## 2019-10-08 DIAGNOSIS — I1310 Hypertensive heart and chronic kidney disease without heart failure, with stage 1 through stage 4 chronic kidney disease, or unspecified chronic kidney disease: Secondary | ICD-10-CM | POA: Diagnosis not present

## 2019-10-08 DIAGNOSIS — E1159 Type 2 diabetes mellitus with other circulatory complications: Secondary | ICD-10-CM | POA: Diagnosis not present

## 2019-10-08 DIAGNOSIS — E7800 Pure hypercholesterolemia, unspecified: Secondary | ICD-10-CM | POA: Diagnosis not present

## 2019-10-08 DIAGNOSIS — R413 Other amnesia: Secondary | ICD-10-CM | POA: Diagnosis not present

## 2020-03-01 DIAGNOSIS — E1159 Type 2 diabetes mellitus with other circulatory complications: Secondary | ICD-10-CM | POA: Diagnosis not present

## 2020-03-01 DIAGNOSIS — L03311 Cellulitis of abdominal wall: Secondary | ICD-10-CM | POA: Diagnosis not present

## 2020-03-21 DIAGNOSIS — H353132 Nonexudative age-related macular degeneration, bilateral, intermediate dry stage: Secondary | ICD-10-CM | POA: Diagnosis not present

## 2020-03-21 DIAGNOSIS — Z961 Presence of intraocular lens: Secondary | ICD-10-CM | POA: Diagnosis not present

## 2020-03-21 DIAGNOSIS — H52203 Unspecified astigmatism, bilateral: Secondary | ICD-10-CM | POA: Diagnosis not present

## 2020-03-21 DIAGNOSIS — E119 Type 2 diabetes mellitus without complications: Secondary | ICD-10-CM | POA: Diagnosis not present

## 2020-05-06 DIAGNOSIS — I25810 Atherosclerosis of coronary artery bypass graft(s) without angina pectoris: Secondary | ICD-10-CM | POA: Diagnosis not present

## 2020-05-06 DIAGNOSIS — E7800 Pure hypercholesterolemia, unspecified: Secondary | ICD-10-CM | POA: Diagnosis not present

## 2020-05-06 DIAGNOSIS — E669 Obesity, unspecified: Secondary | ICD-10-CM | POA: Diagnosis not present

## 2020-05-06 DIAGNOSIS — I2510 Atherosclerotic heart disease of native coronary artery without angina pectoris: Secondary | ICD-10-CM | POA: Diagnosis not present

## 2020-05-06 DIAGNOSIS — D692 Other nonthrombocytopenic purpura: Secondary | ICD-10-CM | POA: Diagnosis not present

## 2020-05-06 DIAGNOSIS — E1159 Type 2 diabetes mellitus with other circulatory complications: Secondary | ICD-10-CM | POA: Diagnosis not present

## 2020-05-06 DIAGNOSIS — E1129 Type 2 diabetes mellitus with other diabetic kidney complication: Secondary | ICD-10-CM | POA: Diagnosis not present

## 2020-05-06 DIAGNOSIS — R809 Proteinuria, unspecified: Secondary | ICD-10-CM | POA: Diagnosis not present

## 2020-05-06 DIAGNOSIS — D472 Monoclonal gammopathy: Secondary | ICD-10-CM | POA: Diagnosis not present

## 2020-05-06 DIAGNOSIS — I1310 Hypertensive heart and chronic kidney disease without heart failure, with stage 1 through stage 4 chronic kidney disease, or unspecified chronic kidney disease: Secondary | ICD-10-CM | POA: Diagnosis not present

## 2020-05-06 DIAGNOSIS — N1832 Chronic kidney disease, stage 3b: Secondary | ICD-10-CM | POA: Diagnosis not present

## 2020-06-21 DIAGNOSIS — Z23 Encounter for immunization: Secondary | ICD-10-CM | POA: Diagnosis not present

## 2020-08-25 DIAGNOSIS — E1129 Type 2 diabetes mellitus with other diabetic kidney complication: Secondary | ICD-10-CM | POA: Diagnosis not present

## 2020-08-25 DIAGNOSIS — R531 Weakness: Secondary | ICD-10-CM | POA: Diagnosis not present

## 2020-08-25 DIAGNOSIS — N1832 Chronic kidney disease, stage 3b: Secondary | ICD-10-CM | POA: Diagnosis not present

## 2020-08-25 DIAGNOSIS — R251 Tremor, unspecified: Secondary | ICD-10-CM | POA: Diagnosis not present

## 2020-09-29 DIAGNOSIS — E119 Type 2 diabetes mellitus without complications: Secondary | ICD-10-CM | POA: Diagnosis not present

## 2020-09-29 DIAGNOSIS — H52203 Unspecified astigmatism, bilateral: Secondary | ICD-10-CM | POA: Diagnosis not present

## 2020-09-29 DIAGNOSIS — H0014 Chalazion left upper eyelid: Secondary | ICD-10-CM | POA: Diagnosis not present

## 2020-09-29 DIAGNOSIS — H353132 Nonexudative age-related macular degeneration, bilateral, intermediate dry stage: Secondary | ICD-10-CM | POA: Diagnosis not present

## 2020-10-11 DIAGNOSIS — E1129 Type 2 diabetes mellitus with other diabetic kidney complication: Secondary | ICD-10-CM | POA: Diagnosis not present

## 2020-10-11 DIAGNOSIS — Z125 Encounter for screening for malignant neoplasm of prostate: Secondary | ICD-10-CM | POA: Diagnosis not present

## 2020-10-11 DIAGNOSIS — E7800 Pure hypercholesterolemia, unspecified: Secondary | ICD-10-CM | POA: Diagnosis not present

## 2020-10-11 DIAGNOSIS — N1832 Chronic kidney disease, stage 3b: Secondary | ICD-10-CM | POA: Diagnosis not present

## 2020-10-18 DIAGNOSIS — D472 Monoclonal gammopathy: Secondary | ICD-10-CM | POA: Diagnosis not present

## 2020-10-18 DIAGNOSIS — I4510 Unspecified right bundle-branch block: Secondary | ICD-10-CM | POA: Diagnosis not present

## 2020-10-18 DIAGNOSIS — I25810 Atherosclerosis of coronary artery bypass graft(s) without angina pectoris: Secondary | ICD-10-CM | POA: Diagnosis not present

## 2020-10-18 DIAGNOSIS — E1159 Type 2 diabetes mellitus with other circulatory complications: Secondary | ICD-10-CM | POA: Diagnosis not present

## 2020-10-18 DIAGNOSIS — K222 Esophageal obstruction: Secondary | ICD-10-CM | POA: Diagnosis not present

## 2020-10-18 DIAGNOSIS — R531 Weakness: Secondary | ICD-10-CM | POA: Diagnosis not present

## 2020-10-18 DIAGNOSIS — E1129 Type 2 diabetes mellitus with other diabetic kidney complication: Secondary | ICD-10-CM | POA: Diagnosis not present

## 2020-10-18 DIAGNOSIS — K219 Gastro-esophageal reflux disease without esophagitis: Secondary | ICD-10-CM | POA: Diagnosis not present

## 2020-10-18 DIAGNOSIS — E7800 Pure hypercholesterolemia, unspecified: Secondary | ICD-10-CM | POA: Diagnosis not present

## 2020-10-18 DIAGNOSIS — M1990 Unspecified osteoarthritis, unspecified site: Secondary | ICD-10-CM | POA: Diagnosis not present

## 2020-10-18 DIAGNOSIS — I1310 Hypertensive heart and chronic kidney disease without heart failure, with stage 1 through stage 4 chronic kidney disease, or unspecified chronic kidney disease: Secondary | ICD-10-CM | POA: Diagnosis not present

## 2020-10-18 DIAGNOSIS — G3184 Mild cognitive impairment, so stated: Secondary | ICD-10-CM | POA: Diagnosis not present

## 2020-10-18 DIAGNOSIS — D692 Other nonthrombocytopenic purpura: Secondary | ICD-10-CM | POA: Diagnosis not present

## 2020-10-18 DIAGNOSIS — R251 Tremor, unspecified: Secondary | ICD-10-CM | POA: Diagnosis not present

## 2020-10-18 DIAGNOSIS — I2510 Atherosclerotic heart disease of native coronary artery without angina pectoris: Secondary | ICD-10-CM | POA: Diagnosis not present

## 2020-10-18 DIAGNOSIS — Z8546 Personal history of malignant neoplasm of prostate: Secondary | ICD-10-CM | POA: Diagnosis not present

## 2020-10-18 DIAGNOSIS — R809 Proteinuria, unspecified: Secondary | ICD-10-CM | POA: Diagnosis not present

## 2020-10-18 DIAGNOSIS — N1832 Chronic kidney disease, stage 3b: Secondary | ICD-10-CM | POA: Diagnosis not present

## 2020-10-18 DIAGNOSIS — Z0000 Encounter for general adult medical examination without abnormal findings: Secondary | ICD-10-CM | POA: Diagnosis not present

## 2020-10-18 DIAGNOSIS — E669 Obesity, unspecified: Secondary | ICD-10-CM | POA: Diagnosis not present

## 2020-11-22 DIAGNOSIS — L299 Pruritus, unspecified: Secondary | ICD-10-CM | POA: Diagnosis not present

## 2020-11-22 DIAGNOSIS — G3184 Mild cognitive impairment, so stated: Secondary | ICD-10-CM | POA: Diagnosis not present

## 2020-11-22 DIAGNOSIS — D692 Other nonthrombocytopenic purpura: Secondary | ICD-10-CM | POA: Diagnosis not present

## 2020-11-22 DIAGNOSIS — E1129 Type 2 diabetes mellitus with other diabetic kidney complication: Secondary | ICD-10-CM | POA: Diagnosis not present

## 2021-03-02 ENCOUNTER — Other Ambulatory Visit: Payer: Self-pay

## 2021-03-02 ENCOUNTER — Encounter: Payer: Self-pay | Admitting: Neurology

## 2021-03-02 ENCOUNTER — Ambulatory Visit: Payer: PPO | Admitting: Neurology

## 2021-03-02 VITALS — BP 115/68 | HR 70 | Ht 67.0 in | Wt 195.8 lb

## 2021-03-02 DIAGNOSIS — R4189 Other symptoms and signs involving cognitive functions and awareness: Secondary | ICD-10-CM

## 2021-03-02 DIAGNOSIS — R0683 Snoring: Secondary | ICD-10-CM | POA: Diagnosis not present

## 2021-03-02 DIAGNOSIS — R400 Somnolence: Secondary | ICD-10-CM

## 2021-03-02 DIAGNOSIS — F32A Depression, unspecified: Secondary | ICD-10-CM | POA: Diagnosis not present

## 2021-03-02 NOTE — Patient Instructions (Addendum)
Sleep evaluation: we will call to schedule Depression?: Need to consider therapy:   Sleep Apnea Sleep apnea affects breathing during sleep. It causes breathing to stop for 10 seconds or more, or to become shallow. People with sleep apnea usually snoreloudly. It can also increase the risk of: Heart attack. Stroke. Being very overweight (obese). Diabetes. Heart failure. Irregular heartbeat. High blood pressure. The goal of treatment is to help you breathe normally again. What are the causes?  The most common cause of this condition is a collapsed or blocked airway. There are three kinds of sleep apnea: Obstructive sleep apnea. This is caused by a blocked or collapsed airway. Central sleep apnea. This happens when the brain does not send the right signals to the muscles that control breathing. Mixed sleep apnea. This is a combination of obstructive and central sleep apnea. What increases the risk? Being overweight. Smoking. Having a small airway. Being older. Being male. Drinking alcohol. Taking medicines to calm yourself (sedatives or tranquilizers). Having family members with the condition. Having a tongue or tonsils that are larger than normal. What are the signs or symptoms? Trouble staying asleep. Loud snoring. Headaches in the morning. Waking up gasping. Dry mouth or sore throat in the morning. Being sleepy or tired during the day. If you are sleepy or tired during the day, you may also: Not be able to focus your mind (concentrate). Forget things. Get angry a lot and have mood swings. Feel sad (depressed). Have changes in your personality. Have less interest in sex, if you are male. Be unable to have an erection, if you are male. How is this treated?  Sleeping on your side. Using a medicine to get rid of mucus in your nose (decongestant). Avoiding the use of alcohol, medicines to help you relax, or certain pain medicines (narcotics). Losing weight, if  needed. Changing your diet. Quitting smoking. Using a machine to open your airway while you sleep, such as: An oral appliance. This is a mouthpiece that shifts your lower jaw forward. A CPAP device. This device blows air through a mask when you breathe out (exhale). An EPAP device. This has valves that you put in each nostril. A BPAP device. This device blows air through a mask when you breathe in (inhale) and breathe out. Having surgery if other treatments do not work. Follow these instructions at home: Lifestyle Make changes that your doctor recommends. Eat a healthy diet. Lose weight if needed. Avoid alcohol, medicines to help you relax, and some pain medicines. Do not smoke or use any products that contain nicotine or tobacco. If you need help quitting, ask your doctor. General instructions Take over-the-counter and prescription medicines only as told by your doctor. If you were given a machine to use while you sleep, use it only as told by your doctor. If you are having surgery, make sure to tell your doctor you have sleep apnea. You may need to bring your device with you. Keep all follow-up visits. Contact a doctor if: The machine that you were given to use during sleep bothers you or does not seem to be working. You do not get better. You get worse. Get help right away if: Your chest hurts. You have trouble breathing in enough air. You have an uncomfortable feeling in your back, arms, or stomach. You have trouble talking. One side of your body feels weak. A part of your face is hanging down. These symptoms may be an emergency. Get help right away. Call your local  emergency services (911 in the [place] Do not wait to see if the symptoms will go away. Do not drive yourself to the hospital. Summary This condition affects breathing during sleep. The most common cause is a collapsed or blocked airway. The goal of treatment is to help you breathe normally while you sleep. This  information is not intended to replace advice given to you by your health care provider. Make sure you discuss any questions you have with your healthcare provider. Document Revised: 08/05/2020 Document Reviewed: 08/05/2020 Elsevier Patient Education  2022 [place].   Major Depressive Disorder, Adult Major depressive disorder is a mental health condition. This disorder affects feelings. It can also affect the body. Symptoms of this condition last most of the day, almost every day, for 2 weeks. This disorder can affect: Relationships. Daily activities, such as work and school. Activities that you normally like to do. What are the causes? The cause of this condition is not known. The disorder is likely caused by a mix of things, including: Your personality, such as being a shy person. Your behavior, or how you act toward others. Your thoughts and feelings. Too much alcohol or drugs. How you react to stress. Health and mental problems that you have had for a long time. Things that hurt you in the past (trauma). Big changes in your life, such as divorce. What increases the risk? The following factors may make you more likely to develop this condition: Having family members with depression. Being a woman. Problems in the family. Low levels of some brain chemicals. Things that caused you pain as a child, especially if you lost a parent or were abused. A lot of stress in your life, such as from: Living without basic needs of life, such as food and shelter. Being treated poorly because of race, sex, or religion (discrimination). Health and mental problems that you have had for a long time. What are the signs or symptoms? The main symptoms of this condition are: Being sad all the time. Being grouchy all the time. Loss of interest in things and activities. Other symptoms include: Sleeping too much or too little. Eating too much or too little. Gaining or losing weight, without  knowing why. Feeling tired or having low energy. Being restless and weak. Feeling hopeless, worthless, or guilty. Trouble thinking clearly or making decisions. Thoughts of hurting yourself or others, or thoughts of ending your life. Spending a lot of time alone. Inability to complete common tasks of daily life. If you have very bad MDD, you may: Believe things that are not true. Hear, see, taste, or feel things that are not there. Have mild depression that lasts for at least 2 years. Feel very sad and hopeless. Have trouble speaking or moving. How is this treated? This condition may be treated with: Talk therapy. This teaches you to know bad thoughts, feelings, and actions and how to change them. This can also help you to communicate with others. This can be done with members of your family. Medicines. These can be used to treat worry (anxiety), depression, or low levels of chemicals in the brain. Lifestyle changes. You may need to: Limit alcohol use. Limit drug use. Get regular exercise. Get plenty of sleep. Make healthy eating choices. Spend more time outdoors. Brain stimulation. This treatment excites the brain. This is done when symptoms are very bad or have not gotten better with other treatments. Follow these instructions at home: Activity Get regular exercise as told. Spend time outdoors  as told. Make time to do the things you enjoy. Find ways to deal with stress. Try to: Meditate. Do deep breathing. Spend time in nature. Keep a journal. Return to your normal activities as told by your doctor. Ask your doctor what activities are safe for you. Alcohol and drug use If you drink alcohol: Limit how much you use to: 0-1 drink a day for women. 0-2 drinks a day for men. Be aware of how much alcohol is in your drink. In the [place] one drink equals one 12 oz bottle of beer (355 mL), one 5 oz glass of wine (148 mL), or one 1 oz glass of hard liquor (44 mL). Talk to your  doctor about: Alcohol use. Alcohol can affect some medicines. Any drug use. General instructions  Take over-the-counter and prescription medicines and herbal preparations only as told by your doctor. Eat a healthy diet. Get a lot of sleep. Think about joining a support group. Your doctor may be able to suggest one. Keep all follow-up visits as told by your doctor. This is important.  Where to find more information: [employer] on Mental Illness: www.nami.[place]: https://carter.com/ American Psychiatric Association: www.psychiatry.org/patients-families/ Contact a doctor if: Your symptoms get worse. You get new symptoms. Get help right away if: You hurt yourself. You have serious thoughts about hurting yourself or others. You see, hear, taste, smell, or feel things that are not there. If you ever feel like you may hurt yourself or others, or have thoughts about taking your own life, get help right away. Go to your nearest emergency department or: Call your local emergency services (911 in the [place] Call a suicide crisis helpline, such as the [place] at 716-611-4482. This is open 24 hours a day in the [place] Text the Crisis Text Line at 956-709-0524 (in the [place].). Summary Major depressive disorder is a mental health condition. This disorder affects feelings. Symptoms of this condition last most of the day, almost every day, for 2 weeks. The symptoms of this disorder can cause problems with relationships and with daily activities. There are treatments and support for people who get this disorder. You may need more than one type of treatment. Get help right away if you have serious thoughts about hurting yourself or others. This information is not intended to replace advice given to you by your health care provider. Make sure you discuss any questions you have with your healthcare provider. Document Revised: 08/08/2019  Document Reviewed: 08/08/2019 Elsevier Patient Education  2022 [employer].

## 2021-03-02 NOTE — Progress Notes (Signed)
[facility]    Provider:  Dr Jaynee Eagles Requesting Provider: Tisovec, Fransico Him, MD Primary Care Provider:  Haywood Pao, MD  CC:  not feeling right  HPI:  Tony Lee is a 87 y.o. male here as requested by Tisovec, Fransico Him, MD for cognitive impairment.  Past medical history coronary artery disease status post CABG 2008, diabetes type 2, chronic kidney disease stage IV, hyperlipidemia, hypertension, GERD, prostate cancer 1994, osteoarthritis, tremor with falls saw Dr. Carles Collet in 2014 now taking B12, mild memory impairment, neuropsych visit 2020.  I saw patient in 2019 and he subsequently had a visit with Dr. Sima Matas in 2020 and per his results which I reviewed from a global perspective the patient was doing quite well and did very well in many areas but did show some mild deficits that did suggest some decline with regard to memory functions as well as overall speed of mental operations as compared to premorbid functioning; these were early signs and symptoms of changes in memory function and overall speed of mental operations and information processing speed.  The pattern was not typical with even early symptoms or signs of condition such as Alzheimer's dementia.  It was suggested to look at other variables such as overall health issues and sleep patterns.  It was suggested he return in 6 to 12 months for repeat testing.  He feels he is on a slide, he had covid and he has been affected long term, he has lost a lot of weight unintentionally, feels sad, cares for his wife with chronic pain, and smell and taste is still affected. It is like his "files are crowded", he can;t remember things, he may remember it in a few minutes. He may forget conversations, or the subject matter or the name but he will remember it again. 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 He takes a nap in the afternoons. His wife has had 4 major back surgeries and has problems with her knees and this is hard since they feel they do not want surgery. He snores at night and falls asleep right away. He is tired during the day. ESS 14. He has stopped driving because of concerns he has. He is having a difficult time in his life.    Review of Systems: Patient complains of symptoms per HPI as well as the following symptoms sadness. Pertinent negatives and positives per HPI. All others negative.   Social History   Socioeconomic History   Marital status: Married    Spouse name: [name]   Number of children: 3   Years of education: Not on file   Highest education level: Bachelor's degree (e.g., BA, AB, BS)  Occupational History   Occupation: printing-retired  Tobacco Use   Smoking status: Former    Packs/day: 0.50    Years: 20.00    Pack years: 10.00    Types: Cigarettes    Quit date: 09/11/1967    Years since quitting: 53.5   Smokeless tobacco: Never   Tobacco comments:    quit 45 years ago  Substance and Sexual Activity   Alcohol use: Yes    Comment: social, 1-2 times per week    Drug use: No   Sexual activity: Not on file  Other Topics Concern   Not on file  Social History  Narrative   Lives with wife at [place] Determinants of Health   Financial Resource Strain: Not on file  Food Insecurity: Not on file  Transportation Needs: Not on file  Physical Activity: Not on file  Stress: Not on file  Social Connections: Not on file  Intimate Partner Violence: Not on file    Family History  Problem Relation Age of Onset   Heart failure Father        age 36   Heart attack Brother        age 52 post third open heart surgery   Heart attack Brother     Past Medical History:  Diagnosis Date   Cancer [facility]) 2004   prostate   Coronary artery disease    Diabetes mellitus type 2 in obese ([place])    Dyslipidemia    Esophageal  stricture    Monoclonal paraproteinemia 03/03/2013   Palpitations    RBBB (right bundle branch block)     Patient Active Problem List   Diagnosis Date Noted   Diabetes mellitus type 2 in obese ([place]) 08/27/2014   Monoclonal paraproteinemia 03/03/2013   Unspecified hereditary and idiopathic peripheral neuropathy 01/29/2013   Ataxia 01/29/2013   Coronary artery disease    Cancer ([place])    RBBB (right bundle branch block)    Palpitations    Dyslipidemia     Past Surgical History:  Procedure Laterality Date   ANKLE SURGERY Left    APPENDECTOMY     CARDIAC CATHETERIZATION     Dr. Martinique   CATARACT EXTRACTION W/ INTRAOCULAR LENS  IMPLANT, BILATERAL     CORONARY ARTERY BYPASS GRAFT  Oct. 2008   x 5,Dr.Gerhardt   HERNIA REPAIR Bilateral    PROSTATECTOMY  2004   radical   right shoulder      replacement   vein strippping      Current Outpatient Medications  Medication Sig Dispense Refill   aspirin 325 MG tablet Take 325 mg by mouth daily.       Black Pepper-Turmeric 3-500 MG CAPS Take 1 tablet po every other day     Choline Fenofibrate (FENOFIBRIC ACID) 45 MG CPDR Take by mouth.     Cyanocobalamin (VITAMIN B-12 PO) Take 1 tablet by mouth daily.     docusate sodium (COLACE) 100 MG capsule 1 capsule as needed     FARXIGA 5 MG TABS tablet Take 5 mg by mouth daily.     losartan (COZAAR) 25 MG tablet Take 25 mg by mouth daily.     metoprolol succinate (TOPROL-XL) 25 MG 24 hr tablet Take 25 mg by mouth daily.  3   omeprazole (PRILOSEC) 40 MG capsule Take 40 mg by mouth daily.      simvastatin (ZOCOR) 20 MG tablet Take 20 mg by mouth at bedtime.       TRADJENTA 5 MG TABS tablet Take 5 mg by mouth daily. Take 1 tab daily  6   Turmeric 500 MG CAPS Take 500 mg by mouth.     nitroGLYCERIN (NITROSTAT) 0.4 MG SL tablet PLACE 1 TABLET (0.4 MG TOTAL) UNDER THE TONGUE EVERY 5 (FIVE) MINUTES AS NEEDED. (Patient not taking: Reported on 03/02/2021) 25 tablet 0   No current facility-administered  medications for this visit.    Allergies as of 03/02/2021 - Review Complete 03/02/2021  Allergen Reaction Noted   Other  03/02/2021    Vitals: BP 115/68   Pulse 70   Ht 5\' 7"  (1.702 m)  Wt 195 lb 12.8 oz (88.8 kg)   BMI 30.67 kg/m  Last Weight:  Wt Readings from Last 1 Encounters:  03/02/21 195 lb 12.8 oz (88.8 kg)   Last Height:   Ht Readings from Last 1 Encounters:  03/02/21 5\' 7"  (1.702 m)     Physical exam: Exam: Gen: NAD, conversant, well nourised, obese, well groomed                     CV: RRR, no MRG. No Carotid Bruits. No peripheral edema, warm, nontender Eyes: Conjunctivae clear without exudates or hemorrhage  Neuro: Detailed Neurologic Exam  Speech:    Speech is normal; fluent and spontaneous with normal comprehension.  Cognition:  MMSE - Mini Mental State Exam 03/02/2021 06/12/2018 06/12/2018  Orientation to time 4 - 5  Orientation to Place 5 - 5  Registration 3 - 3  Attention/ Calculation 4 - 5  Recall 1 - 2  Language- name 2 objects 2 - 2  Language- repeat 0 - 1  Language- follow 3 step command 3 - 3  Language- read & follow direction 1 - 1  Write a sentence 1 - 1  Copy design 1 1 1   Total score 25 - 29       The patient is oriented to person, place, and time;     recent and remote memory intact;     language fluent;     normal attention, concentration,     fund of knowledge Cranial Nerves:    The pupils are equal, round, and reactive to light.  Pupils too small to visualize fundi visual fields are full to finger confrontation. Extraocular movements are intact. Trigeminal sensation is intact and the muscles of mastication are normal. The face is symmetric. The palate elevates in the midline. Hearing intact. Voice is normal. Shoulder shrug is normal. The tongue has normal motion without fasciculations.   Coordination:    Normal finger to nose and heel to shin. Normal rapid alternating movements.   Gait:    Antalgic with a cane slightly low  clearance and stooped posture  Motor Observation:    No asymmetry, no atrophy, and no involuntary movements noted. Tone:    Normal muscle tone.      Strength:    Strength is symmetrical in the upper and lower limbs.  No focal deficits noted.     Sensation: intact to LT     Reflex Exam:  DTR's:    Deep tendon reflexes in the upper and lower extremities are symmetrical  bilaterally.   Toes:    The toes are downgoing bilaterally.   Clonus:    Clonus is absent.    Assessment/Plan:  87 y.o. male here as requested by Tisovec, Fransico Him, MD for cognitive impairment.  Past medical history coronary artery disease status post CABG 2008, diabetes type 2, chronic kidney disease stage IV, hyperlipidemia, hypertension, GERD, prostate cancer 1994, osteoarthritis, tremor with falls saw Dr. Carles Collet in 2014 now taking B12, mild memory impairment, neuropsych visit 2020.  -I spent an extended visit with patient today, he appears depressed to me.  He told me that some days he does not know why he is here.  He is caring for his wife who is in chronic pain.  He is getting older, he is concerned with all his medical issues and his wife's medical problems, he says he does not know why he feels sad because he has 3 beautiful children, he has lost 20  pounds unintentionally and feels sad, [facility] to go into assisted living because of his wife's health conditions and he is not happy about that.  We talked for quite some time, I think patient is having a difficult time in life, he appears depressed to me, he does not want to take medications, I highly encouraged him to seek a therapist and he eventually agreed.  I will talk to Noemi Chapel at [facility] and see if there is some when she would recommend for this patient.  - Please refer him to Benjiman Core who is a Transport planner at [facility], [place], [place], [place]: [phone]. Please inform them that Noemi Chapel has  given this the go ahead to schedule him.   - sleep study -patient feels cognitively slowed, he feels very fatigued during the day, he does say he snores, ESS 14.  I suggested we send him to my sleep doctor to ensure that a lot of the things he is feeling are not due to sleep apnea considering his Epworth sleepiness scale of 14.  -Patient's cognitive testing was quite good in 2020, today I really do not appreciate any immediate concerns for dementia Mini-Mental status exam was 25/30, I did discuss repeat neurocognitive testing and patient was not very excited to do that in fact he found the process quite cumbersome.  If he does in the future decide to repeat his neurocognitive testing he would like to do it with someone other than Dr.Rodenbough although he did like him very much.  I did tell him that the process is inherently cumbersome however we can refer him to Alphonzo Severance next time if he does decide to repeat.  - He has stopped driving but I encouraged him to get a driving test and we will refer him  Orders Placed This Encounter  Procedures   Ambulatory referral to Sleep Studies   Ambulatory referral to Psychiatry    No orders of the defined types were placed in this encounter.   Cc: Tisovec, Fransico Him, MD,  Tisovec, Fransico Him, MD  Sarina Ill, MD  [facility] [address] [place], [place] [address]  Phone [phone] Fax [phone]  I spent 80 minutes of face-to-face and non-face-to-face time with patient on the  1. Daytime somnolence   2. Snoring   3. Depression, unspecified depression type   4. Subjective memory complaints    diagnosis.  This included previsit chart review, lab review, study review, order entry, electronic health record documentation, patient education on the different diagnostic and therapeutic options, counseling and coordination of care, risks and benefits of management, compliance, or risk factor reduction

## 2021-03-09 ENCOUNTER — Telehealth: Payer: Self-pay | Admitting: *Deleted

## 2021-03-09 NOTE — Telephone Encounter (Signed)
-----   Message from Melvenia Beam, MD sent at 03/08/2021  6:52 PM EDT ----- Regarding: Referrals for patient Can you please refer patient to driving study in [place] please? And call him and let him know that we have sent it in, if he doesn't hear back within a week or so he should call them (I gave him the information)  Also tell him that I spoke to my contact at [facility] and she recommended Benjiman Core.  I am going to place a referral to her for patient. If he does not hear back from them in a week or so he can call them at: [phone] and tell them that Noemi Chapel has given the "go ahead" for him to see Hoyle Sauer.  if he has any problems we can help thanks   If he has any problems he should call us back and we can help. thanks

## 2021-03-09 NOTE — Telephone Encounter (Signed)
Faxed referral to [facility] for [employer].

## 2021-03-09 NOTE — Telephone Encounter (Signed)
Spoke with patient and discussed message below from Dr Jaynee Eagles regarding referrals to [employer] and Benjiman Core  at [facility] Noemi Chapel gave go-ahead). Patient is amenable to the plan although he stated the driving rehab services is low on his priority list. He verbalized appreciation for the call and he is aware if he has not received calls from the above two places within 1 week we would like for him to call their offices.   Referral faxed to [facility] in [place]. Received a receipt of confirmation.

## 2021-03-15 NOTE — Telephone Encounter (Signed)
Patient is scheduled with NP Benjiman Core at [facility] on 7/22.

## 2021-03-20 DIAGNOSIS — H52203 Unspecified astigmatism, bilateral: Secondary | ICD-10-CM | POA: Diagnosis not present

## 2021-03-20 DIAGNOSIS — Z961 Presence of intraocular lens: Secondary | ICD-10-CM | POA: Diagnosis not present

## 2021-03-20 DIAGNOSIS — H524 Presbyopia: Secondary | ICD-10-CM | POA: Diagnosis not present

## 2021-03-20 DIAGNOSIS — H0011 Chalazion right upper eyelid: Secondary | ICD-10-CM | POA: Diagnosis not present

## 2021-03-31 DIAGNOSIS — F320 Major depressive disorder, single episode, mild: Secondary | ICD-10-CM | POA: Diagnosis not present

## 2021-04-09 DIAGNOSIS — N1832 Chronic kidney disease, stage 3b: Secondary | ICD-10-CM | POA: Diagnosis not present

## 2021-04-09 DIAGNOSIS — I1310 Hypertensive heart and chronic kidney disease without heart failure, with stage 1 through stage 4 chronic kidney disease, or unspecified chronic kidney disease: Secondary | ICD-10-CM | POA: Diagnosis not present

## 2021-04-09 DIAGNOSIS — E7800 Pure hypercholesterolemia, unspecified: Secondary | ICD-10-CM | POA: Diagnosis not present

## 2021-04-09 DIAGNOSIS — E1122 Type 2 diabetes mellitus with diabetic chronic kidney disease: Secondary | ICD-10-CM | POA: Diagnosis not present

## 2021-04-24 DIAGNOSIS — F320 Major depressive disorder, single episode, mild: Secondary | ICD-10-CM | POA: Diagnosis not present

## 2021-05-09 ENCOUNTER — Institutional Professional Consult (permissible substitution): Payer: PPO | Admitting: Neurology

## 2021-06-05 DIAGNOSIS — E1159 Type 2 diabetes mellitus with other circulatory complications: Secondary | ICD-10-CM | POA: Diagnosis not present

## 2021-06-05 DIAGNOSIS — I25810 Atherosclerosis of coronary artery bypass graft(s) without angina pectoris: Secondary | ICD-10-CM | POA: Diagnosis not present

## 2021-06-05 DIAGNOSIS — D692 Other nonthrombocytopenic purpura: Secondary | ICD-10-CM | POA: Diagnosis not present

## 2021-06-05 DIAGNOSIS — Z23 Encounter for immunization: Secondary | ICD-10-CM | POA: Diagnosis not present

## 2021-06-05 DIAGNOSIS — E669 Obesity, unspecified: Secondary | ICD-10-CM | POA: Diagnosis not present

## 2021-06-05 DIAGNOSIS — Z8546 Personal history of malignant neoplasm of prostate: Secondary | ICD-10-CM | POA: Diagnosis not present

## 2021-06-05 DIAGNOSIS — G3184 Mild cognitive impairment, so stated: Secondary | ICD-10-CM | POA: Diagnosis not present

## 2021-06-05 DIAGNOSIS — E7800 Pure hypercholesterolemia, unspecified: Secondary | ICD-10-CM | POA: Diagnosis not present

## 2021-06-05 DIAGNOSIS — I2510 Atherosclerotic heart disease of native coronary artery without angina pectoris: Secondary | ICD-10-CM | POA: Diagnosis not present

## 2021-06-05 DIAGNOSIS — Z1389 Encounter for screening for other disorder: Secondary | ICD-10-CM | POA: Diagnosis not present

## 2021-06-05 DIAGNOSIS — I1310 Hypertensive heart and chronic kidney disease without heart failure, with stage 1 through stage 4 chronic kidney disease, or unspecified chronic kidney disease: Secondary | ICD-10-CM | POA: Diagnosis not present

## 2021-06-05 DIAGNOSIS — N1832 Chronic kidney disease, stage 3b: Secondary | ICD-10-CM | POA: Diagnosis not present

## 2021-06-05 DIAGNOSIS — E1129 Type 2 diabetes mellitus with other diabetic kidney complication: Secondary | ICD-10-CM | POA: Diagnosis not present

## 2021-06-05 DIAGNOSIS — Z1331 Encounter for screening for depression: Secondary | ICD-10-CM | POA: Diagnosis not present

## 2021-06-05 DIAGNOSIS — I4510 Unspecified right bundle-branch block: Secondary | ICD-10-CM | POA: Diagnosis not present

## 2021-08-31 ENCOUNTER — Ambulatory Visit: Payer: PPO | Admitting: Neurology

## 2021-10-04 DIAGNOSIS — E1129 Type 2 diabetes mellitus with other diabetic kidney complication: Secondary | ICD-10-CM | POA: Diagnosis not present

## 2021-10-04 DIAGNOSIS — I1310 Hypertensive heart and chronic kidney disease without heart failure, with stage 1 through stage 4 chronic kidney disease, or unspecified chronic kidney disease: Secondary | ICD-10-CM | POA: Diagnosis not present

## 2021-10-04 DIAGNOSIS — G3184 Mild cognitive impairment, so stated: Secondary | ICD-10-CM | POA: Diagnosis not present

## 2021-10-04 DIAGNOSIS — Z022 Encounter for examination for admission to residential institution: Secondary | ICD-10-CM | POA: Diagnosis not present

## 2021-10-04 DIAGNOSIS — I2510 Atherosclerotic heart disease of native coronary artery without angina pectoris: Secondary | ICD-10-CM | POA: Diagnosis not present

## 2021-10-08 DIAGNOSIS — N1832 Chronic kidney disease, stage 3b: Secondary | ICD-10-CM | POA: Diagnosis not present

## 2021-10-08 DIAGNOSIS — I2510 Atherosclerotic heart disease of native coronary artery without angina pectoris: Secondary | ICD-10-CM | POA: Diagnosis not present

## 2021-10-08 DIAGNOSIS — E7800 Pure hypercholesterolemia, unspecified: Secondary | ICD-10-CM | POA: Diagnosis not present

## 2021-10-08 DIAGNOSIS — I1310 Hypertensive heart and chronic kidney disease without heart failure, with stage 1 through stage 4 chronic kidney disease, or unspecified chronic kidney disease: Secondary | ICD-10-CM | POA: Diagnosis not present

## 2021-10-27 DIAGNOSIS — Z125 Encounter for screening for malignant neoplasm of prostate: Secondary | ICD-10-CM | POA: Diagnosis not present

## 2021-10-27 DIAGNOSIS — I2510 Atherosclerotic heart disease of native coronary artery without angina pectoris: Secondary | ICD-10-CM | POA: Diagnosis not present

## 2021-10-27 DIAGNOSIS — E7800 Pure hypercholesterolemia, unspecified: Secondary | ICD-10-CM | POA: Diagnosis not present

## 2021-10-27 DIAGNOSIS — E1129 Type 2 diabetes mellitus with other diabetic kidney complication: Secondary | ICD-10-CM | POA: Diagnosis not present

## 2021-11-03 DIAGNOSIS — Z0000 Encounter for general adult medical examination without abnormal findings: Secondary | ICD-10-CM | POA: Diagnosis not present

## 2021-11-03 DIAGNOSIS — I2510 Atherosclerotic heart disease of native coronary artery without angina pectoris: Secondary | ICD-10-CM | POA: Diagnosis not present

## 2021-11-03 DIAGNOSIS — E1129 Type 2 diabetes mellitus with other diabetic kidney complication: Secondary | ICD-10-CM | POA: Diagnosis not present

## 2021-11-03 DIAGNOSIS — E7800 Pure hypercholesterolemia, unspecified: Secondary | ICD-10-CM | POA: Diagnosis not present

## 2021-11-03 DIAGNOSIS — E669 Obesity, unspecified: Secondary | ICD-10-CM | POA: Diagnosis not present

## 2021-11-03 DIAGNOSIS — I25810 Atherosclerosis of coronary artery bypass graft(s) without angina pectoris: Secondary | ICD-10-CM | POA: Diagnosis not present

## 2021-11-03 DIAGNOSIS — E1159 Type 2 diabetes mellitus with other circulatory complications: Secondary | ICD-10-CM | POA: Diagnosis not present

## 2021-11-03 DIAGNOSIS — I1310 Hypertensive heart and chronic kidney disease without heart failure, with stage 1 through stage 4 chronic kidney disease, or unspecified chronic kidney disease: Secondary | ICD-10-CM | POA: Diagnosis not present

## 2021-11-03 DIAGNOSIS — N1832 Chronic kidney disease, stage 3b: Secondary | ICD-10-CM | POA: Diagnosis not present

## 2021-11-03 DIAGNOSIS — Z1339 Encounter for screening examination for other mental health and behavioral disorders: Secondary | ICD-10-CM | POA: Diagnosis not present

## 2021-11-03 DIAGNOSIS — I4510 Unspecified right bundle-branch block: Secondary | ICD-10-CM | POA: Diagnosis not present

## 2021-11-03 DIAGNOSIS — Z1331 Encounter for screening for depression: Secondary | ICD-10-CM | POA: Diagnosis not present

## 2021-11-03 DIAGNOSIS — D472 Monoclonal gammopathy: Secondary | ICD-10-CM | POA: Diagnosis not present

## 2021-11-03 DIAGNOSIS — G3184 Mild cognitive impairment, so stated: Secondary | ICD-10-CM | POA: Diagnosis not present

## 2022-03-06 DIAGNOSIS — H903 Sensorineural hearing loss, bilateral: Secondary | ICD-10-CM | POA: Diagnosis not present

## 2022-04-20 ENCOUNTER — Observation Stay (HOSPITAL_COMMUNITY): Payer: PPO

## 2022-04-20 ENCOUNTER — Encounter (HOSPITAL_COMMUNITY): Payer: Self-pay

## 2022-04-20 ENCOUNTER — Other Ambulatory Visit: Payer: Self-pay

## 2022-04-20 ENCOUNTER — Inpatient Hospital Stay (HOSPITAL_COMMUNITY)
Admission: EM | Admit: 2022-04-20 | Discharge: 2022-04-23 | DRG: 247 | Disposition: A | Discharge status: Skilled Nursing Facility | Payer: PPO | Source: Skilled Nursing Facility | Attending: General Practice | Admitting: General Practice

## 2022-04-20 ENCOUNTER — Emergency Department (HOSPITAL_COMMUNITY): Payer: PPO

## 2022-04-20 DIAGNOSIS — Z66 Do not resuscitate: Secondary | ICD-10-CM | POA: Diagnosis present

## 2022-04-20 DIAGNOSIS — I214 Non-ST elevation (NSTEMI) myocardial infarction: Principal | ICD-10-CM

## 2022-04-20 DIAGNOSIS — F32A Depression, unspecified: Secondary | ICD-10-CM | POA: Diagnosis present

## 2022-04-20 DIAGNOSIS — R072 Precordial pain: Secondary | ICD-10-CM

## 2022-04-20 DIAGNOSIS — E669 Obesity, unspecified: Secondary | ICD-10-CM | POA: Diagnosis present

## 2022-04-20 DIAGNOSIS — I2582 Chronic total occlusion of coronary artery: Secondary | ICD-10-CM | POA: Diagnosis present

## 2022-04-20 DIAGNOSIS — I440 Atrioventricular block, first degree: Secondary | ICD-10-CM | POA: Diagnosis present

## 2022-04-20 DIAGNOSIS — E785 Hyperlipidemia, unspecified: Secondary | ICD-10-CM | POA: Diagnosis present

## 2022-04-20 DIAGNOSIS — F0390 Unspecified dementia without behavioral disturbance: Secondary | ICD-10-CM | POA: Diagnosis present

## 2022-04-20 DIAGNOSIS — Z6830 Body mass index (BMI) 30.0-30.9, adult: Secondary | ICD-10-CM

## 2022-04-20 DIAGNOSIS — R231 Pallor: Secondary | ICD-10-CM | POA: Diagnosis not present

## 2022-04-20 DIAGNOSIS — Z7982 Long term (current) use of aspirin: Secondary | ICD-10-CM | POA: Diagnosis not present

## 2022-04-20 DIAGNOSIS — I252 Old myocardial infarction: Secondary | ICD-10-CM | POA: Diagnosis not present

## 2022-04-20 DIAGNOSIS — I10 Essential (primary) hypertension: Secondary | ICD-10-CM

## 2022-04-20 DIAGNOSIS — Z888 Allergy status to other drugs, medicaments and biological substances status: Secondary | ICD-10-CM | POA: Diagnosis not present

## 2022-04-20 DIAGNOSIS — Z951 Presence of aortocoronary bypass graft: Secondary | ICD-10-CM | POA: Diagnosis not present

## 2022-04-20 DIAGNOSIS — Z8546 Personal history of malignant neoplasm of prostate: Secondary | ICD-10-CM

## 2022-04-20 DIAGNOSIS — Z79899 Other long term (current) drug therapy: Secondary | ICD-10-CM | POA: Diagnosis not present

## 2022-04-20 DIAGNOSIS — I129 Hypertensive chronic kidney disease with stage 1 through stage 4 chronic kidney disease, or unspecified chronic kidney disease: Secondary | ICD-10-CM | POA: Diagnosis present

## 2022-04-20 DIAGNOSIS — Z9079 Acquired absence of other genital organ(s): Secondary | ICD-10-CM

## 2022-04-20 DIAGNOSIS — R410 Disorientation, unspecified: Secondary | ICD-10-CM | POA: Diagnosis not present

## 2022-04-20 DIAGNOSIS — R079 Chest pain, unspecified: Secondary | ICD-10-CM | POA: Diagnosis present

## 2022-04-20 DIAGNOSIS — N1832 Chronic kidney disease, stage 3b: Secondary | ICD-10-CM | POA: Diagnosis present

## 2022-04-20 DIAGNOSIS — Z96611 Presence of right artificial shoulder joint: Secondary | ICD-10-CM | POA: Diagnosis present

## 2022-04-20 DIAGNOSIS — E1165 Type 2 diabetes mellitus with hyperglycemia: Secondary | ICD-10-CM | POA: Diagnosis present

## 2022-04-20 DIAGNOSIS — I4510 Unspecified right bundle-branch block: Secondary | ICD-10-CM | POA: Diagnosis present

## 2022-04-20 DIAGNOSIS — I200 Unstable angina: Secondary | ICD-10-CM | POA: Diagnosis not present

## 2022-04-20 DIAGNOSIS — Z7984 Long term (current) use of oral hypoglycemic drugs: Secondary | ICD-10-CM | POA: Diagnosis not present

## 2022-04-20 DIAGNOSIS — E782 Mixed hyperlipidemia: Secondary | ICD-10-CM | POA: Diagnosis not present

## 2022-04-20 DIAGNOSIS — R0789 Other chest pain: Secondary | ICD-10-CM | POA: Diagnosis not present

## 2022-04-20 DIAGNOSIS — E1122 Type 2 diabetes mellitus with diabetic chronic kidney disease: Secondary | ICD-10-CM | POA: Diagnosis present

## 2022-04-20 DIAGNOSIS — D472 Monoclonal gammopathy: Secondary | ICD-10-CM | POA: Diagnosis present

## 2022-04-20 DIAGNOSIS — I2510 Atherosclerotic heart disease of native coronary artery without angina pectoris: Secondary | ICD-10-CM | POA: Diagnosis not present

## 2022-04-20 DIAGNOSIS — Z955 Presence of coronary angioplasty implant and graft: Secondary | ICD-10-CM

## 2022-04-20 DIAGNOSIS — Z8249 Family history of ischemic heart disease and other diseases of the circulatory system: Secondary | ICD-10-CM

## 2022-04-20 DIAGNOSIS — I25110 Atherosclerotic heart disease of native coronary artery with unstable angina pectoris: Secondary | ICD-10-CM | POA: Diagnosis present

## 2022-04-20 DIAGNOSIS — I259 Chronic ischemic heart disease, unspecified: Principal | ICD-10-CM

## 2022-04-20 DIAGNOSIS — R001 Bradycardia, unspecified: Secondary | ICD-10-CM | POA: Diagnosis not present

## 2022-04-20 DIAGNOSIS — R61 Generalized hyperhidrosis: Secondary | ICD-10-CM | POA: Diagnosis not present

## 2022-04-20 DIAGNOSIS — Z87891 Personal history of nicotine dependence: Secondary | ICD-10-CM

## 2022-04-20 HISTORY — DX: Chronic kidney disease, stage 3b: N18.32

## 2022-04-20 HISTORY — DX: Depression, unspecified: F32.A

## 2022-04-20 LAB — ECHOCARDIOGRAM COMPLETE
Area-P 1/2: 2.66 cm2
Height: 67 in
S' Lateral: 3 cm
Weight: 3120 oz

## 2022-04-20 LAB — BASIC METABOLIC PANEL
Anion gap: 8 (ref 5–15)
BUN: 30 mg/dL — ABNORMAL HIGH (ref 8–23)
CO2: 21 mmol/L — ABNORMAL LOW (ref 22–32)
Calcium: 9.2 mg/dL (ref 8.9–10.3)
Chloride: 107 mmol/L (ref 98–111)
Creatinine, Ser: 1.59 mg/dL — ABNORMAL HIGH (ref 0.61–1.24)
GFR, Estimated: 41 mL/min — ABNORMAL LOW (ref >60)
Glucose, Bld: 306 mg/dL — ABNORMAL HIGH (ref 70–99)
Potassium: 4.5 mmol/L (ref 3.5–5.1)
Sodium: 136 mmol/L (ref 135–145)

## 2022-04-20 LAB — CBC
HCT: 40.9 % (ref 39.0–52.0)
Hemoglobin: 13.4 g/dL (ref 13.0–17.0)
MCH: 31.9 pg (ref 26.0–34.0)
MCHC: 32.8 g/dL (ref 30.0–36.0)
MCV: 97.4 fL (ref 80.0–100.0)
Platelets: 240 10*3/uL (ref 150–400)
RBC: 4.2 MIL/uL — ABNORMAL LOW (ref 4.22–5.81)
RDW: 12.2 % (ref 11.5–15.5)
WBC: 8 10*3/uL (ref 4.0–10.5)
nRBC: 0 % (ref 0.0–0.2)

## 2022-04-20 LAB — TROPONIN I (HIGH SENSITIVITY)
Troponin I (High Sensitivity): 9 ng/L (ref <18)
Troponin I (High Sensitivity): 19 ng/L — ABNORMAL HIGH (ref <18)
Troponin I (High Sensitivity): 29 ng/L — ABNORMAL HIGH (ref <18)

## 2022-04-20 LAB — CBG MONITORING, ED
Glucose-Capillary: 182 mg/dL — ABNORMAL HIGH (ref 70–99)
Glucose-Capillary: 131 mg/dL — ABNORMAL HIGH (ref 70–99)

## 2022-04-20 LAB — HEMOGLOBIN A1C
Hgb A1c MFr Bld: 8 % — ABNORMAL HIGH (ref 4.8–5.6)
Mean Plasma Glucose: 182.9 mg/dL

## 2022-04-20 MED ORDER — ACETAMINOPHEN 325 MG PO TABS: 650.0000 mg | ORAL_TABLET | ORAL | Status: DC | PRN

## 2022-04-20 MED ORDER — ROSUVASTATIN CALCIUM 20 MG PO TABS
40.0000 mg | ORAL_TABLET | Freq: Every day | ORAL | Status: DC
  Administered 2022-04-20 – 2022-04-23 (×4): 40 mg via ORAL
  Filled 2022-04-20 (×5): qty 2

## 2022-04-20 MED ORDER — LOSARTAN POTASSIUM 50 MG PO TABS
50.0000 mg | ORAL_TABLET | Freq: Every day | ORAL | Status: DC
  Administered 2022-04-21: 50 mg via ORAL
  Filled 2022-04-20: qty 1

## 2022-04-20 MED ORDER — SIMVASTATIN 20 MG PO TABS
20.0000 mg | ORAL_TABLET | Freq: Every day | ORAL | Status: DC
Start: 2022-04-20 — End: 2022-04-20

## 2022-04-20 MED ORDER — ASPIRIN 325 MG PO TABS: 325.0000 mg | ORAL_TABLET | Freq: Every day | ORAL | Status: DC

## 2022-04-20 MED ORDER — PANTOPRAZOLE SODIUM 40 MG PO TBEC
40.0000 mg | DELAYED_RELEASE_TABLET | Freq: Every day | ORAL | Status: DC
  Administered 2022-04-20 – 2022-04-23 (×4): 40 mg via ORAL
  Filled 2022-04-20 (×5): qty 1

## 2022-04-20 MED ORDER — AMLODIPINE BESYLATE 5 MG PO TABS
5.0000 mg | ORAL_TABLET | Freq: Every day | ORAL | Status: DC
  Administered 2022-04-20 – 2022-04-21 (×2): 5 mg via ORAL
  Filled 2022-04-20 (×2): qty 1

## 2022-04-20 MED ORDER — ASPIRIN 81 MG PO TBEC
81.0000 mg | DELAYED_RELEASE_TABLET | Freq: Every day | ORAL | Status: DC
  Administered 2022-04-21 – 2022-04-23 (×3): 81 mg via ORAL
  Filled 2022-04-20 (×3): qty 1

## 2022-04-20 MED ORDER — LINAGLIPTIN 5 MG PO TABS
5.0000 mg | ORAL_TABLET | Freq: Every day | ORAL | Status: DC
  Administered 2022-04-21 – 2022-04-22 (×2): 5 mg via ORAL
  Filled 2022-04-20 (×3): qty 1

## 2022-04-20 MED ORDER — HEPARIN SODIUM (PORCINE) 5000 UNIT/ML IJ SOLN
5000.0000 [IU] | Freq: Two times a day (BID) | INTRAMUSCULAR | Status: DC
  Administered 2022-04-20 – 2022-04-21 (×2): 5000 [IU] via SUBCUTANEOUS
  Filled 2022-04-20 (×3): qty 1

## 2022-04-20 MED ORDER — METOPROLOL SUCCINATE ER 25 MG PO TB24
25.0000 mg | ORAL_TABLET | Freq: Every day | ORAL | Status: DC
Start: 2022-04-20 — End: 2022-04-20

## 2022-04-20 MED ORDER — LOSARTAN POTASSIUM 50 MG PO TABS
25.0000 mg | ORAL_TABLET | Freq: Every day | ORAL | Status: DC
  Filled 2022-04-20: qty 1

## 2022-04-20 MED ORDER — DOCUSATE SODIUM 50 MG PO CAPS
50.0000 mg | ORAL_CAPSULE | Freq: Every day | ORAL | Status: DC | PRN
Start: 2022-04-20 — End: 2022-04-23

## 2022-04-20 MED ORDER — FENOFIBRATE 160 MG PO TABS
160.0000 mg | ORAL_TABLET | Freq: Every day | ORAL | Status: DC
  Administered 2022-04-21 – 2022-04-23 (×3): 160 mg via ORAL
  Filled 2022-04-20 (×4): qty 1

## 2022-04-20 MED ORDER — ONDANSETRON HCL 4 MG/2ML IJ SOLN: 4.0000 mg | Freq: Four times a day (QID) | INTRAMUSCULAR | Status: DC | PRN

## 2022-04-20 MED ORDER — INSULIN ASPART 100 UNIT/ML IJ SOLN
0.0000 [IU] | Freq: Three times a day (TID) | INTRAMUSCULAR | Status: DC
  Administered 2022-04-20: 2 [IU] via SUBCUTANEOUS
  Administered 2022-04-21: 3 [IU] via SUBCUTANEOUS
  Administered 2022-04-21 – 2022-04-22 (×4): 2 [IU] via SUBCUTANEOUS
  Administered 2022-04-23: 3 [IU] via SUBCUTANEOUS
  Administered 2022-04-23: 2 [IU] via SUBCUTANEOUS
  Administered 2022-04-23: 3 [IU] via SUBCUTANEOUS

## 2022-04-20 NOTE — Consult Note (Addendum)
Cardiology Consultation:   Patient ID: Tony Lee MRN: [id number]; DOB: 04-24-1931  Admit date: 04/20/2022 Date of Consult: 04/20/2022  PCP:  Haywood Pao, MD   [facility] Providers Cardiologist:  Peter Martinique, MD        Patient Profile:   Tony Lee is a 88 y.o. male with a hx of CAD with NSTEMI s/p CABG 2008, DM, dyslipidemia, chronic RBBB, esophageal stricture, prostate CA, monoclonal gammopathy, depression (no dementia per 2022 neuro visit), tremor with falls, CKD stage 3b, monoclonal gammopathy who is being seen 04/20/2022 for the evaluation of chest pain at the request of Dr. Roosevelt Locks.  History of Present Illness:   Tony Lee had remote bypass in 2008 with last ischemic eval via stress test in 2014 with slight scar at the base of the inferolateral wall, no ischemia, EF 62%, low risk. Last echo 2011 with mild LVH, EF 60%, mild LAE, mild RV dilation, aortic sclerosis without stenosis. Last seen in the office in 2020 and felt to be doing well - occasional pulsation in chest reported. He has not required repeat intervention since CABG. He reports he's been in [place] lately doing well. He had had occasional fleeting ping of chest pain very rarely and planned to make an appointment with our office. Today when he was walking back from the breakfast area at [facility] to get his pills (including ASA '325mg'$  which he took), he developed right arm pain followed soon after by left arm pain and substernal chest pain associated with sweating. This reminded him of his prior MI pain so he notified the staff who checked his BP. He reports it was around 128 systolic so EMS was called. The pain waxed and waned for about 30 minutes total, resolved without intervention, did not receive any SL NTG. He felt it was made worse by the ambulance ride but otherwise denies pain with specific movement, inspiration, palpation. No recent history of similar symptoms. No SOB, nausea, vomiting, palpitations. He  remains pain free. He has eaten breakfast and is finishing lunch presently. Initial BP peak 184/88, improved without intervention to 137/74, then back up most recently to 179/78. Labs show hsTroponin 9->19, glucose 306, Cr 1.59, Hgb wnl. 2D echo today showed EF 60-65%, grade 1 DD, mild LVH of the basal-septal segment, normal RV.   Past Medical History:  Diagnosis Date   Cancer ([place]) 09/10/2002   prostate   Chronic kidney disease, stage 3b ([place])    Coronary artery disease    Depression    Diabetes mellitus type 2 in obese ([facility])    Dyslipidemia    Esophageal stricture    Monoclonal paraproteinemia 03/03/2013   Palpitations    RBBB (right bundle branch block)     Past Surgical History:  Procedure Laterality Date   ANKLE SURGERY Left    APPENDECTOMY     CARDIAC CATHETERIZATION     Dr. Martinique   CATARACT EXTRACTION W/ INTRAOCULAR LENS  IMPLANT, BILATERAL     CORONARY ARTERY BYPASS GRAFT  Oct. 2008   x 5,Dr.Gerhardt   HERNIA REPAIR Bilateral    PROSTATECTOMY  2004   radical   right shoulder      replacement   vein strippping       Home Medications:  Prior to Admission medications   Medication Sig Start Date End Date Taking? Authorizing Provider  aspirin 325 MG tablet Take 325 mg by mouth daily.      [provider]  Black Pepper-Turmeric 3-500 MG CAPS  Take 1 tablet po every other day 04/10/19   [provider]  Choline Fenofibrate (FENOFIBRIC ACID) 45 MG CPDR Take by mouth.    [provider]  Cyanocobalamin (VITAMIN B-12 PO) Take 1 tablet by mouth daily.    [provider]  docusate sodium (COLACE) 100 MG capsule 1 capsule as needed    [provider]  FARXIGA 5 MG TABS tablet Take 5 mg by mouth daily. 02/08/21   [provider]  losartan (COZAAR) 25 MG tablet Take 25 mg by mouth daily. 12/28/20   [provider]  metoprolol succinate (TOPROL-XL) 25 MG 24 hr tablet Take 25 mg by mouth daily. 07/24/14   [provider]  nitroGLYCERIN (NITROSTAT) 0.4 MG SL tablet PLACE 1 TABLET (0.4 MG TOTAL) UNDER THE TONGUE EVERY 5 (FIVE) MINUTES AS NEEDED. Patient not taking: Reported on 03/02/2021 11/07/17   Martinique, Peter M, MD  omeprazole (PRILOSEC) 40 MG capsule Take 40 mg by mouth daily.  01/08/13   [provider]  simvastatin (ZOCOR) 20 MG tablet Take 20 mg by mouth at bedtime.      [provider]  TRADJENTA 5 MG TABS tablet Take 5 mg by mouth daily. Take 1 tab daily 09/17/15   [provider]  Turmeric 500 MG CAPS Take 500 mg by mouth.    [provider]    Inpatient Medications: Scheduled Meds:  [START ON 04/21/2022] aspirin  325 mg Oral Daily   fenofibrate  160 mg Oral Daily   heparin  5,000 Units Subcutaneous Q12H   insulin aspart  0-9 Units Subcutaneous TID WC   linagliptin  5 mg Oral Daily   losartan  25 mg Oral Daily   pantoprazole  40 mg Oral Daily   rosuvastatin  40 mg Oral Daily   Continuous Infusions:  PRN Meds: acetaminophen, docusate sodium, ondansetron (ZOFRAN) IV  Allergies:    Allergies  Allergen Reactions   Other      Mepergan Fortis, (meprozine) - itching    Social History:   Social History   Socioeconomic History   Marital status: Married    Spouse name: [name]   Number of children: 3   Years of education: Not on file   Highest education level: Bachelor's degree (e.g., BA, AB, BS)  Occupational History   Occupation: printing-retired  Tobacco Use   Smoking status: Former    Packs/day: 0.50    Years: 20.00    Total pack years: 10.00    Types: Cigarettes    Quit date: 09/11/1967    Years since quitting: 54.6   Smokeless tobacco: Never   Tobacco comments:    quit 45 years ago  Substance and Sexual Activity   Alcohol use: Yes    Comment: social, 1-2 times per week    Drug use: No   Sexual activity: Not on file  Other Topics Concern   Not on file  Social History Narrative   Lives with wife at [place] Strain: Not on file  Food Insecurity: Not on file  Transportation Needs: Not on file  Physical Activity: Not on file  Stress: Not on file  Social Connections: Not on file  Intimate Partner Violence: Not on file    Family History:   Family History  Problem Relation Age of Onset   Heart failure Father        age 12   Heart attack Brother  age 54 post third open heart surgery   Heart attack Brother      ROS:  Please see the history of present illness.  All other ROS reviewed and negative.     Physical Exam/Data:   Vitals:   04/20/22 1135 04/20/22 1155 04/20/22 1200 04/20/22 1230  BP: (!) 184/88 (!) 184/88 137/77 137/74  Pulse: 64 (!) 55 (!) 54 (!) 54  Resp:  '19 14 15  '$ Temp:      TempSrc:      SpO2: 100% 98% 98% 97%  Weight:      Height:       No intake or output data in the 24 hours ending 04/20/22 1450    04/20/2022   10:15 AM 03/02/2021    3:09 PM 07/14/2019    8:03 AM  Last 3 Weights  Weight (lbs) 195 lb 195 lb 12.8 oz 209 lb  Weight (kg) 88.451 kg 88.814 kg 94.802 kg     Body mass index is 30.54 kg/m.  General: Well developed, well nourished WM, in no acute distress. Head: Normocephalic, atraumatic, sclera non-icteric, no xanthomas, nares are without discharge. Neck: Negative for carotid bruits. JVP not elevated. Lungs: Clear bilaterally to auscultation without wheezes, rales, or rhonchi. Breathing is unlabored. Heart: RRR S1 S2 without murmurs, rubs, or gallops.  Abdomen: Soft, non-tender, non-distended with normoactive bowel sounds. No rebound/guarding. Extremities: No clubbing or cyanosis. No edema. Distal pedal pulses are 2+ and equal bilaterally. Neuro: Alert and oriented X 3. Moves all extremities spontaneously. Psych:  Responds to questions appropriately with a normal affect.   EKG:  The EKG was personally reviewed and demonstrates:  NSR 63bpm with first degree AVB, known RBBB, nonspecific STTW  changes Telemetry:  Telemetry was personally reviewed and demonstrates:  SB 49-62bpm, rare PVC  Relevant CV Studies:  Stress test 2014 Impression Exercise Capacity:  Lexiscan with low level exercise. BP Response:  Normal blood pressure response. Clinical Symptoms:  Patient's head feels funny ECG Impression:  No significant ST segment change suggestive of ischemia. Comparison with Prior Nuclear Study: No images to compare   Overall Impression:  This is a low risk scan. There is no ischemia. There is excellent wall motion. There is question of a very slight scar at the base of the inferolateral wall.   LV Ejection Fraction: 62%.  LV Wall Motion:  Normal Wall Motion.   Dola Argyle, MD    Laboratory Data:  High Sensitivity Troponin:   Recent Labs  Lab 04/20/22 1022 04/20/22 1228  TROPONINIHS 9 19*     Chemistry Recent Labs  Lab 04/20/22 1022  NA 136  K 4.5  CL 107  CO2 21*  GLUCOSE 306*  BUN 30*  CREATININE 1.59*  CALCIUM 9.2  GFRNONAA 41*  ANIONGAP 8    No results for input(s): "PROT", "ALBUMIN", "AST", "ALT", "ALKPHOS", "BILITOT" in the last 168 hours. Lipids No results for input(s): "CHOL", "TRIG", "HDL", "LABVLDL", "LDLCALC", "CHOLHDL" in the last 168 hours.  Hematology Recent Labs  Lab 04/20/22 1022  WBC 8.0  RBC 4.20*  HGB 13.4  HCT 40.9  MCV 97.4  MCH 31.9  MCHC 32.8  RDW 12.2  PLT 240   Thyroid No results for input(s): "TSH", "FREET4" in the last 168 hours.  BNPNo results for input(s): "BNP", "PROBNP" in the last 168 hours.  DDimer No results for input(s): "DDIMER" in the last 168 hours.   Radiology/Studies:  DG Chest 2 View  Result Date: 04/20/2022 CLINICAL DATA:  Chest  pain in center of chest pressure radiating to the left arm. EXAM: CHEST - 2 VIEW COMPARISON:  Chest two views 07/24/2007 FINDINGS: Status post median sternotomy and CABG. Cardiac silhouette and mediastinal contours are within normal limits. Mild calcification within the aortic  arch. Unchanged calcified benign granuloma within the inferior medial anterior right lower lobe. Mild bibasilar bronchovascular crowding. No pleural effusion or pneumothorax. Partial visualization of right shoulder arthroplasty. IMPRESSION: No active cardiopulmonary disease. Electronically Signed   By: Yvonne Kendall M.D.   On: 04/20/2022 10:53     Assessment and Plan:   1. Chest pain concerning for unstable angina - 30 minutes of pain, felt like prior angina, associated with sweating (has had previous zing-like chest pains that sound non-anginal, but this specific episode reminded him of his prior MI) - hsTroponin 9->19, a follow-up value would be helpful to discern whether this represents NSTEMI - 2D echo with normal LVEF, no RWMA - decrease maintenance aspirin to '81mg'$  daily per guidelines - got '325mg'$  today - continue rosuvastatin - metoprolol on hold - HR 49-62bpm on telemetry - will continue to hold but consider resumption at lower dose tomorrow if needed - per preliminary d/w Dr. Johney Frame, await 3rd troponin for disposition for next steps (stress test/medical management versus cath on Monday) - hold off heparin per pharmacy unless 3rd troponin rises further or has recurrent chest pain - addendum 4:46 PM: 3rd troponin came back marginally up at 29. Dr. Johney Frame had discussed potential cath vs med rx with patient and initial strategy will be BP control/follow for symptoms over the weekend to decide next steps. As below, amlodipine started as both anti-hypertensive and anti-anginal therapy. Patient requests to take minimal amount of medicine so hold off Imdur but can consider long acting nitrate if pain recurs. Per d/w MD, hold off IV heparin at this time given low level troponins and patient remaining pain free. No plan for stress testing at this time, order discontinued, notified Saturday nuc med tech of cancellation. We'll follow up another troponin level in the AM as well. Pt updated of  plan.  2. CAD s/p CABG 2008, dyslipidemia - medical therapy as outlined above  3. CKD stage 3b - Cr appears at baseline at 1.59 (previously 1.5-1.7)  4. Chronic RBBB, also with first degree AVB - hold BB given mild sinus bradycardia - follow HR trends on telemetry and consider resuming lower dose in AM  5. Essential HTN - BP elevated in ER, we'll start amlodipine '5mg'$  daily and continue losartan '25mg'$  daily for now (can re-eval plan for ARB in AM once decision is made about whether he plans to pursue cath)   Risk Assessment/Risk Scores:     HEAR Score (for undifferentiated chest pain): calculator n/a  For questions or updates, please contact [facility] Please consult [email or web address] for contact info under    Signed, Charlie Pitter, PA-C  04/20/2022 2:50 PM  Patient seen and examined and agree with Melina Copa, PA-C as detailed above.  In brief, the patient is a 88 year old male with history of CAD with NSTEMI s/p CABG 2008, DM, dyslipidemia, chronic RBBB, esophageal stricture, prostate CA, monoclonal gammopathy, depression (no dementia per 2022 neuro visit), tremor with falls, CKD stage 3b, monoclonal gammopathy who presented to the ER with chest pain found to have mildly elevated troponin for which Cardiology was consulted.  Patient with known history of CAD with remote CABG in 2008. He had been doing well until today when he was walking  back from breakfast and developed substernal chest pressure radiating to his arms. He notified the staff and his BP was 200 prompting EMS to be called. Pain eventually resolved without intervention after 54mn.  On arrival here, BP elevated to 180s/80s. Trop 9>19>29. ECG with NSR, first degree AVB, RBBB. TTE with LVEF 60-65%, no WMA, no significant valve disease.  Currently the patient is chest pain free and comfortable. Had long discussion with the patient and his family as his symptoms sound like classic angina in the setting of known CAD. He is  currently DNR and would like to try to manage medically at this time unless symptoms worsen as he would like to try to avoid cath/contrast unless completely needed due to CKD. I think this is very reasonable. Will observe overnight and optimize antianginals. If fails medical therapy, can proceed with cath.  GEN: Elderly male, comfortbale   Neck: No JVD Cardiac: RRR, no murmurs, rubs, or gallops.  Respiratory: Clear to auscultation bilaterally. GI: Soft, nontender, non-distended  MS: No edema; No deformity. Neuro:  Nonfocal  Psych: Normal affect    Plan: -Patient would like to trial medical therapy and only proceed with cath if symptoms fail to improve  -Continue ASA '81mg'$  daily -Holding BB due to bradycardia on arrival; will resume lower dose tomorrow -Start amlodipine '5mg'$  daily -Increase losartan to '50mg'$  daily  -Can add imdur/ranexa as needed -Continue crestor '40mg'$  daily  Plan discussed extensively with patient and his family.   HGwyndolyn Kaufman MD

## 2022-04-20 NOTE — Progress Notes (Signed)
  Echocardiogram 2D Echocardiogram has been performed.  Merrie Roof F 04/20/2022, 2:14 PM

## 2022-04-20 NOTE — ED Provider Notes (Signed)
[facility] EMERGENCY DEPARTMENT Provider Note   CSN: [id number] Arrival date & time: 04/20/22  1005     History  Chief Complaint  Patient presents with   Chest Pain    TRISTAIN DAILY is a 88 y.o. male.  Triage: Chest pain in the center of his chest like pressure radiating to left arm but reports to ems it had subsided when they got there but now has come back since arriving. Denies SOB n.v  88 year old male with a history of CAD status post CABG in 2008, chronic RBBB, DM 2, CKD, HTN, HLD who presents to the emergency department with chest pressure. Says that it started at 0830 today when walking back from his breakfast and started experiencing left arm pain that started to include his right arm and then involved his chest.  Says he sat down as he was becoming diaphoretic which improved the pain.  No vomiting.  Did not try nitroglycerin because he did not have it on him.  Reports that he did take 324 of aspirin this morning with his medications.  Says that 911 was called and on the way over had another episode that spontaneously resolved of the above symptoms.  Denies any pleurisy, shortness of breath, or cough recently.  No lower extremity swelling or pain.  Denies any numbness or weakness of his arms or legs.  No recent illnesses.  Says that he believes his last stress test was years ago shortly after he had his CABG.  No history of stents.  Not on blood thinners.  Denies chest pain at this time.   Chest Pain  Past Medical History:  Diagnosis Date   Cancer (Carlos) 09/10/2002   prostate   Chronic kidney disease, stage 3b (HCC)    Coronary artery disease    Depression    Diabetes mellitus type 2 in obese [facility])    Dyslipidemia    Esophageal stricture    Monoclonal paraproteinemia 03/03/2013   Palpitations    RBBB (right bundle branch block)       Home Medications Prior to Admission medications   Medication Sig Start Date End Date Taking? Authorizing Provider   aspirin 325 MG tablet Take 325 mg by mouth daily.      [provider]  Black Pepper-Turmeric 3-500 MG CAPS Take 1 tablet po every other day 04/10/19   [provider]  Choline Fenofibrate (FENOFIBRIC ACID) 45 MG CPDR Take by mouth.    [provider]  Cyanocobalamin (VITAMIN B-12 PO) Take 1 tablet by mouth daily.    [provider]  docusate sodium (COLACE) 100 MG capsule 1 capsule as needed    [provider]  FARXIGA 5 MG TABS tablet Take 5 mg by mouth daily. 02/08/21   [provider]  losartan (COZAAR) 25 MG tablet Take 25 mg by mouth daily. 12/28/20   [provider]  metoprolol succinate (TOPROL-XL) 25 MG 24 hr tablet Take 25 mg by mouth daily. 07/24/14   [provider]  nitroGLYCERIN (NITROSTAT) 0.4 MG SL tablet PLACE 1 TABLET (0.4 MG TOTAL) UNDER THE TONGUE EVERY 5 (FIVE) MINUTES AS NEEDED. Patient not taking: Reported on 03/02/2021 11/07/17   Martinique, Peter M, MD  omeprazole (PRILOSEC) 40 MG capsule Take 40 mg by mouth daily.  01/08/13   [provider]  simvastatin (ZOCOR) 20 MG tablet Take 20 mg by mouth at bedtime.      [provider]  TRADJENTA 5 MG TABS tablet Take  5 mg by mouth daily. Take 1 tab daily 09/17/15   [provider]  Turmeric 500 MG CAPS Take 500 mg by mouth.    [provider]      Allergies    Other    Review of Systems   Review of Systems  Cardiovascular:  Positive for chest pain.    Physical Exam Updated Vital Signs BP (!) 179/78   Pulse 64   Temp 98.4 F (36.9 C) (Oral)   Resp 20   Ht '5\' 7"'$  (1.702 m)   Wt 88.5 kg   SpO2 98%   BMI 30.54 kg/m  Physical Exam Vitals and nursing note reviewed.  Constitutional:      General: He is not in acute distress.    Appearance: He is well-developed.  HENT:     Head: Normocephalic and atraumatic.     Right Ear: External ear normal.     Left Ear: External ear normal.     Nose: Nose normal.  Eyes:      Extraocular Movements: Extraocular movements intact.     Conjunctiva/sclera: Conjunctivae normal.     Pupils: Pupils are equal, round, and reactive to light.  Cardiovascular:     Rate and Rhythm: Normal rate and regular rhythm.     Heart sounds: Normal heart sounds.  Pulmonary:     Effort: Pulmonary effort is normal. No respiratory distress.     Breath sounds: Normal breath sounds.  Abdominal:     General: There is no distension.     Palpations: Abdomen is soft. There is no mass.     Tenderness: There is no abdominal tenderness. There is no guarding.  Musculoskeletal:        General: No swelling.     Cervical back: Normal range of motion and neck supple.     Right lower leg: No edema.     Left lower leg: No edema.  Skin:    General: Skin is warm and dry.     Capillary Refill: Capillary refill takes less than 2 seconds.  Neurological:     Mental Status: He is alert. Mental status is at baseline.  Psychiatric:        Mood and Affect: Mood normal.        Behavior: Behavior normal.     ED Results / Procedures / Treatments   Labs (all labs ordered are listed, but only abnormal results are displayed) Labs Reviewed  BASIC METABOLIC PANEL - Abnormal; Notable for the following components:      Result Value   CO2 21 (*)    Glucose, Bld 306 (*)    BUN 30 (*)    Creatinine, Ser 1.59 (*)    GFR, Estimated 41 (*)    All other components within normal limits  CBC - Abnormal; Notable for the following components:   RBC 4.20 (*)    All other components within normal limits  HEMOGLOBIN A1C - Abnormal; Notable for the following components:   Hgb A1c MFr Bld 8.0 (*)    All other components within normal limits  TROPONIN I (HIGH SENSITIVITY) - Abnormal; Notable for the following components:   Troponin I (High Sensitivity) 19 (*)    All other components within normal limits  TSH  TROPONIN I (HIGH SENSITIVITY)  TROPONIN I (HIGH SENSITIVITY)    EKG EKG  Interpretation  Date/Time:  Friday April 20 2022 10:16:12 EDT Ventricular Rate:  63 PR Interval:  288 QRS Duration: 134 QT Interval:  406  QTC Calculation: 415 R Axis:   43 Text Interpretation: Sinus rhythm with 1st degree A-V block Right bundle branch block Abnormal ECG When compared with ECG of 03-Jul-2007 08:10, PREVIOUS ECG IS PRESENT Confirmed by Margaretmary Eddy [phone]) on 04/20/2022 11:06:29 AM  Radiology ECHOCARDIOGRAM COMPLETE  Result Date: 04/20/2022    ECHOCARDIOGRAM REPORT   Patient Name:   ELPIDIO THIELEN Date of Exam: 04/20/2022 Medical Rec #:  [id number]       Height:       67.0 in Accession #:    [id number]      Weight:       195.0 lb Date of Birth:  September 06, 1931       BSA:          2.001 m Patient Age:    81 years        BP:           180/86 mmHg Patient Gender: M               HR:           53 bpm. Exam Location:  Inpatient Procedure: 2D Echo, Cardiac Doppler and Color Doppler Indications:    Chest pain  History:        Patient has no prior history of Echocardiogram examinations.                 Previous Myocardial Infarction and CAD, Prior CABG;                 Arrythmias:Bradycardia.  Sonographer:    Merrie Roof RDCS Referring Phys: [id number] [place]  1. Left ventricular ejection fraction, by estimation, is 60 to 65%. The left ventricle has normal function. The left ventricle has no regional wall motion abnormalities. There is mild left ventricular hypertrophy of the basal-septal segment. Left ventricular diastolic parameters are consistent with Grade I diastolic dysfunction (impaired relaxation).  2. Right ventricular systolic function is normal. The right ventricular size is normal.  3. The mitral valve is normal in structure. No evidence of mitral valve regurgitation. No evidence of mitral stenosis.  4. The aortic valve is tricuspid. Aortic valve regurgitation is trivial. Aortic valve sclerosis/calcification is present, without any evidence of aortic stenosis.  5. The  inferior vena cava is normal in size with greater than 50% respiratory variability, suggesting right atrial pressure of 3 mmHg. FINDINGS  Left Ventricle: Left ventricular ejection fraction, by estimation, is 60 to 65%. The left ventricle has normal function. The left ventricle has no regional wall motion abnormalities. The left ventricular internal cavity size was normal in size. There is  mild left ventricular hypertrophy of the basal-septal segment. Left ventricular diastolic parameters are consistent with Grade I diastolic dysfunction (impaired relaxation). Normal left ventricular filling pressure. Right Ventricle: The right ventricular size is normal. No increase in right ventricular wall thickness. Right ventricular systolic function is normal. Left Atrium: Left atrial size was normal in size. Right Atrium: Right atrial size was normal in size. Pericardium: There is no evidence of pericardial effusion. Mitral Valve: The mitral valve is normal in structure. No evidence of mitral valve regurgitation. No evidence of mitral valve stenosis. Tricuspid Valve: The tricuspid valve is normal in structure. Tricuspid valve regurgitation is trivial. No evidence of tricuspid stenosis. Aortic Valve: The aortic valve is tricuspid. Aortic valve regurgitation is trivial. Aortic valve sclerosis/calcification is present, without any evidence of aortic stenosis. Pulmonic Valve: The pulmonic valve was normal in structure. Pulmonic valve regurgitation  is trivial. No evidence of pulmonic stenosis. Aorta: The aortic root is normal in size and structure. Venous: The inferior vena cava is normal in size with greater than 50% respiratory variability, suggesting right atrial pressure of 3 mmHg. IAS/Shunts: No atrial level shunt detected by color flow Doppler.  LEFT VENTRICLE PLAX 2D LVIDd:         4.50 cm   Diastology LVIDs:         3.00 cm   LV e' medial:    6.64 cm/s LV PW:         0.90 cm   LV E/e' medial:  8.5 LV IVS:        0.90 cm    LV e' lateral:   9.14 cm/s LVOT diam:     2.10 cm   LV E/e' lateral: 6.2 LV SV:         73 LV SV Index:   37 LVOT Area:     3.46 cm  RIGHT VENTRICLE RV Basal diam:  2.90 cm RV S prime:     6.60 cm/s TAPSE (M-mode): 1.5 cm LEFT ATRIUM             Index        RIGHT ATRIUM           Index LA diam:        4.30 cm 2.15 cm/m   RA Area:     18.30 cm LA Vol (A2C):   51.7 ml 25.84 ml/m  RA Volume:   48.30 ml  24.14 ml/m LA Vol (A4C):   52.0 ml 25.99 ml/m LA Biplane Vol: 51.9 ml 25.94 ml/m  AORTIC VALVE LVOT Vmax:   94.20 cm/s LVOT Vmean:  59.100 cm/s LVOT VTI:    0.212 m  AORTA Ao Root diam: 3.50 cm Ao Asc diam:  3.10 cm MITRAL VALVE MV Area (PHT): 2.66 cm    SHUNTS MV Decel Time: 285 msec    Systemic VTI:  0.21 m MV E velocity: 56.40 cm/s  Systemic Diam: 2.10 cm MV A velocity: 70.60 cm/s MV E/A ratio:  0.80 Fransico Him MD Electronically signed by Fransico Him MD Signature Date/Time: 04/20/2022/3:00:09 PM    Final    DG Chest 2 View  Result Date: 04/20/2022 CLINICAL DATA:  Chest pain in center of chest pressure radiating to the left arm. EXAM: CHEST - 2 VIEW COMPARISON:  Chest two views 07/24/2007 FINDINGS: Status post median sternotomy and CABG. Cardiac silhouette and mediastinal contours are within normal limits. Mild calcification within the aortic arch. Unchanged calcified benign granuloma within the inferior medial anterior right lower lobe. Mild bibasilar bronchovascular crowding. No pleural effusion or pneumothorax. Partial visualization of right shoulder arthroplasty. IMPRESSION: No active cardiopulmonary disease. Electronically Signed   By: Yvonne Kendall M.D.   On: 04/20/2022 10:53    Procedures Procedures   Medications Ordered in ED Medications  fenofibrate tablet 160 mg (160 mg Oral Not Given 04/20/22 1545)  losartan (COZAAR) tablet 25 mg (25 mg Oral Not Given 04/20/22 1543)  linagliptin (TRADJENTA) tablet 5 mg (5 mg Oral Not Given 04/20/22 1544)  docusate sodium (COLACE) capsule 50 mg (has no  administration in time range)  pantoprazole (PROTONIX) EC tablet 40 mg (has no administration in time range)  acetaminophen (TYLENOL) tablet 650 mg (has no administration in time range)  ondansetron (ZOFRAN) injection 4 mg (has no administration in time range)  heparin injection 5,000 Units (5,000 Units Subcutaneous Given 04/20/22 1540)  insulin aspart (novoLOG) injection  0-9 Units (has no administration in time range)  rosuvastatin (CRESTOR) tablet 40 mg (has no administration in time range)  aspirin EC tablet 81 mg (has no administration in time range)  amLODipine (NORVASC) tablet 5 mg (has no administration in time range)    ED Course/ Medical Decision Making/ A&P Clinical Course as of 04/20/22 1616  Fri Apr 20, 2022  1612 Patient admitted to medicine for further management.  Did have uptrending troponin from 9 to 19. [RP]    Clinical Course User Index [RP] Fransico Meadow, MD                           Medical Decision Making 88 year old male with a history of CAD status post CABG in 2008, chronic RBBB, DM 2, CKD, HTN, HLD who presents to the emergency department with chest pressure with radiation to both arms and diaphoresis.  This history is highly concerning for myocardial ischemia.  Patient was given 324 of aspirin already today so will not give additional load at this time.  EKG was obtained that showed persistent right bundle branch block with T wave flattening and inversions in V2 and V3 which can be common for right bundle branch block but are slightly different than prior.  Patient not having any pain at this time is no indication for nitroglycerin or morphine.  Also considered DVT/PE but patient's history is not consistent with this no symptoms of a dissection at this time and on chest x-ray is mediastinum is within normal limits. Plan: Labs Troponin Serial EKGs Admit for further investigation of his chest discomfort given his high risk and very concerning history  Amount  and/or Complexity of Data Reviewed Labs: ordered. Radiology: ordered.  Risk Decision regarding hospitalization.    Final Clinical Impression(s) / ED Diagnoses Final diagnoses:  Chest pain due to myocardial ischemia, unspecified ischemic chest pain type    Rx / DC Orders ED Discharge Orders     None         Fransico Meadow, MD 04/20/22 1643

## 2022-04-20 NOTE — Progress Notes (Signed)
Progress Note  Patient Name: Tony Lee Date of Encounter: 04/20/2022  [facility] Cardiologist: Peter Martinique, MD ***  Subjective   ***  Inpatient Medications    Scheduled Meds:  amLODipine  5 mg Oral Daily   [START ON 04/21/2022] aspirin EC  81 mg Oral Daily   fenofibrate  160 mg Oral Daily   heparin  5,000 Units Subcutaneous Q12H   insulin aspart  0-9 Units Subcutaneous TID WC   linagliptin  5 mg Oral Daily   [START ON 04/21/2022] losartan  50 mg Oral Daily   pantoprazole  40 mg Oral Daily   rosuvastatin  40 mg Oral Daily   Continuous Infusions:  PRN Meds: acetaminophen, docusate sodium, ondansetron (ZOFRAN) IV   Vital Signs    Vitals:   04/20/22 1845 04/20/22 1900 04/20/22 2000 04/20/22 2056  BP: 125/72 125/69 121/76   Pulse: 63 63 (!) 52   Resp: (!) 22 19    Temp:    98.2 F (36.8 C)  TempSrc:    Oral  SpO2: 95% 95% 97%   Weight:      Height:       No intake or output data in the 24 hours ending 04/20/22 2126    04/20/2022   10:15 AM 03/02/2021    3:09 PM 07/14/2019    8:03 AM  Last 3 Weights  Weight (lbs) 195 lb 195 lb 12.8 oz 209 lb  Weight (kg) 88.451 kg 88.814 kg 94.802 kg      Telemetry    *** - Personally Reviewed  ECG    *** - Personally Reviewed  Physical Exam  *** GEN: No acute distress.   Neck: No JVD Cardiac: RRR, no murmurs, rubs, or gallops.  Respiratory: Clear to auscultation bilaterally. GI: Soft, nontender, non-distended  MS: No edema; No deformity. Neuro:  Nonfocal  Psych: Normal affect   Labs    High Sensitivity Troponin:   Recent Labs  Lab 04/20/22 1022 04/20/22 1228 04/20/22 1531  TROPONINIHS 9 19* 29*     Chemistry Recent Labs  Lab 04/20/22 1022  NA 136  K 4.5  CL 107  CO2 21*  GLUCOSE 306*  BUN 30*  CREATININE 1.59*  CALCIUM 9.2  GFRNONAA 41*  ANIONGAP 8    Lipids No results for input(s): "CHOL", "TRIG", "HDL", "LABVLDL", "LDLCALC", "CHOLHDL" in the last 168 hours.  Hematology Recent  Labs  Lab 04/20/22 1022  WBC 8.0  RBC 4.20*  HGB 13.4  HCT 40.9  MCV 97.4  MCH 31.9  MCHC 32.8  RDW 12.2  PLT 240   Thyroid No results for input(s): "TSH", "FREET4" in the last 168 hours.  BNPNo results for input(s): "BNP", "PROBNP" in the last 168 hours.  DDimer No results for input(s): "DDIMER" in the last 168 hours.   Radiology    ECHOCARDIOGRAM COMPLETE  Result Date: 04/20/2022    ECHOCARDIOGRAM REPORT   Patient Name:   Tony Lee Date of Exam: 04/20/2022 Medical Rec #:  [id number]       Height:       67.0 in Accession #:    [id number]      Weight:       195.0 lb Date of Birth:  04-30-1931       BSA:          2.001 m Patient Age:    88 years        BP:  180/86 mmHg Patient Gender: M               HR:           53 bpm. Exam Location:  Inpatient Procedure: 2D Echo, Cardiac Doppler and Color Doppler Indications:    Chest pain  History:        Patient has no prior history of Echocardiogram examinations.                 Previous Myocardial Infarction and CAD, Prior CABG;                 Arrythmias:Bradycardia.  Sonographer:    Merrie Roof RDCS Referring Phys: [id number] [place]  1. Left ventricular ejection fraction, by estimation, is 60 to 65%. The left ventricle has normal function. The left ventricle has no regional wall motion abnormalities. There is mild left ventricular hypertrophy of the basal-septal segment. Left ventricular diastolic parameters are consistent with Grade I diastolic dysfunction (impaired relaxation).  2. Right ventricular systolic function is normal. The right ventricular size is normal.  3. The mitral valve is normal in structure. No evidence of mitral valve regurgitation. No evidence of mitral stenosis.  4. The aortic valve is tricuspid. Aortic valve regurgitation is trivial. Aortic valve sclerosis/calcification is present, without any evidence of aortic stenosis.  5. The inferior vena cava is normal in size with greater than 50% respiratory  variability, suggesting right atrial pressure of 3 mmHg. FINDINGS  Left Ventricle: Left ventricular ejection fraction, by estimation, is 60 to 65%. The left ventricle has normal function. The left ventricle has no regional wall motion abnormalities. The left ventricular internal cavity size was normal in size. There is  mild left ventricular hypertrophy of the basal-septal segment. Left ventricular diastolic parameters are consistent with Grade I diastolic dysfunction (impaired relaxation). Normal left ventricular filling pressure. Right Ventricle: The right ventricular size is normal. No increase in right ventricular wall thickness. Right ventricular systolic function is normal. Left Atrium: Left atrial size was normal in size. Right Atrium: Right atrial size was normal in size. Pericardium: There is no evidence of pericardial effusion. Mitral Valve: The mitral valve is normal in structure. No evidence of mitral valve regurgitation. No evidence of mitral valve stenosis. Tricuspid Valve: The tricuspid valve is normal in structure. Tricuspid valve regurgitation is trivial. No evidence of tricuspid stenosis. Aortic Valve: The aortic valve is tricuspid. Aortic valve regurgitation is trivial. Aortic valve sclerosis/calcification is present, without any evidence of aortic stenosis. Pulmonic Valve: The pulmonic valve was normal in structure. Pulmonic valve regurgitation is trivial. No evidence of pulmonic stenosis. Aorta: The aortic root is normal in size and structure. Venous: The inferior vena cava is normal in size with greater than 50% respiratory variability, suggesting right atrial pressure of 3 mmHg. IAS/Shunts: No atrial level shunt detected by color flow Doppler.  LEFT VENTRICLE PLAX 2D LVIDd:         4.50 cm   Diastology LVIDs:         3.00 cm   LV e' medial:    6.64 cm/s LV PW:         0.90 cm   LV E/e' medial:  8.5 LV IVS:        0.90 cm   LV e' lateral:   9.14 cm/s LVOT diam:     2.10 cm   LV E/e' lateral:  6.2 LV SV:         73 LV SV Index:  37 LVOT Area:     3.46 cm  RIGHT VENTRICLE RV Basal diam:  2.90 cm RV S prime:     6.60 cm/s TAPSE (M-mode): 1.5 cm LEFT ATRIUM             Index        RIGHT ATRIUM           Index LA diam:        4.30 cm 2.15 cm/m   RA Area:     18.30 cm LA Vol (A2C):   51.7 ml 25.84 ml/m  RA Volume:   48.30 ml  24.14 ml/m LA Vol (A4C):   52.0 ml 25.99 ml/m LA Biplane Vol: 51.9 ml 25.94 ml/m  AORTIC VALVE LVOT Vmax:   94.20 cm/s LVOT Vmean:  59.100 cm/s LVOT VTI:    0.212 m  AORTA Ao Root diam: 3.50 cm Ao Asc diam:  3.10 cm MITRAL VALVE MV Area (PHT): 2.66 cm    SHUNTS MV Decel Time: 285 msec    Systemic VTI:  0.21 m MV E velocity: 56.40 cm/s  Systemic Diam: 2.10 cm MV A velocity: 70.60 cm/s MV E/A ratio:  0.80 Fransico Him MD Electronically signed by Fransico Him MD Signature Date/Time: 04/20/2022/3:00:09 PM    Final    DG Chest 2 View  Result Date: 04/20/2022 CLINICAL DATA:  Chest pain in center of chest pressure radiating to the left arm. EXAM: CHEST - 2 VIEW COMPARISON:  Chest two views 07/24/2007 FINDINGS: Status post median sternotomy and CABG. Cardiac silhouette and mediastinal contours are within normal limits. Mild calcification within the aortic arch. Unchanged calcified benign granuloma within the inferior medial anterior right lower lobe. Mild bibasilar bronchovascular crowding. No pleural effusion or pneumothorax. Partial visualization of right shoulder arthroplasty. IMPRESSION: No active cardiopulmonary disease. Electronically Signed   By: Yvonne Kendall M.D.   On: 04/20/2022 10:53    Cardiac Studies   TTE 04/20/22: IMPRESSIONS     1. Left ventricular ejection fraction, by estimation, is 60 to 65%. The  left ventricle has normal function. The left ventricle has no regional  wall motion abnormalities. There is mild left ventricular hypertrophy of  the basal-septal segment. Left  ventricular diastolic parameters are consistent with Grade I diastolic   dysfunction (impaired relaxation).   2. Right ventricular systolic function is normal. The right ventricular  size is normal.   3. The mitral valve is normal in structure. No evidence of mitral valve  regurgitation. No evidence of mitral stenosis.   4. The aortic valve is tricuspid. Aortic valve regurgitation is trivial.  Aortic valve sclerosis/calcification is present, without any evidence of  aortic stenosis.   5. The inferior vena cava is normal in size with greater than 50%  respiratory variability, suggesting right atrial pressure of 3 mmHg.   Patient Profile     88 y.o. male with history of CAD with NSTEMI s/p CABG 2008, DM, dyslipidemia, chronic RBBB, esophageal stricture, prostate CA, monoclonal gammopathy, depression (no dementia per 2022 neuro visit), tremor with falls, CKD stage 3b, monoclonal gammopathy who presented to the ER with chest pain found to have mildly elevated troponin for which Cardiology was consulted.  Assessment & Plan    #Chest Pain: #CAD with history of CABG: -Patient would like to trial medical therapy and only proceed with cath if symptoms fail to improve  -Continue ASA '81mg'$  daily -Holding BB due to bradycardia on arrival; will resume lower dose tomorrow -Start amlodipine '5mg'$  daily -Increase losartan  to '50mg'$  daily  -Can add imdur/ranexa as needed -Continue crestor '40mg'$  daily  #CKD stage 3b - Cr appears at baseline at 1.59 (previously 1.5-1.7)   #Chronic RBBB, also with first degree AVB - hold BB given mild sinus bradycardia - follow HR trends on telemetry and consider resuming lower dose in AM   #Essential HTN - BP elevated in ER, we'll start amlodipine '5mg'$  daily and continue losartan '25mg'$  daily for now (can re-eval plan for ARB in AM once decision is made about whether he plans to pursue cath) {Are we signing off today?:[id number]}  For questions or updates, please contact [facility] Please consult [email or web address] for contact info under         Signed, Freada Bergeron, MD  04/20/2022, 9:26 PM

## 2022-04-20 NOTE — H&P (Signed)
History and Physical    MOODY ROBBEN LPF:[id number] DOB: 03/24/1931 DOA: 04/20/2022  PCP: Haywood Pao, MD (Confirm with patient/family/NH records and if not entered, this has to be entered at [facility] point of entry) Patient coming from: Home  I have personally briefly reviewed patient's old medical records in [place]  Chief Complaint: Chest pain  HPI: Tony Lee is a 88 y.o. male with medical history significant of CAD/MI s/p CABG with 6 vessels in 2018, with a normal nuclear stress test 2018, HTN, IIDM, CKD stage IIIb, HLD, presented with chest pain.  Patient ate a regular breakfast, while walking outside in the community, started to feel left arm heaviness and then soon the right arm also had sharp pain.  Soon afterwards, he started to feel pressure-like chest pain associated with diaphoresis and shortness of breath.  No palpitations no lightheadedness no nauseous vomiting.  He had to sit down to let the episode pass, and chest pain did resolved in 3 to 5 minutes before EMS arrived.  Did not according to the EMS record, patient had another episode of chest pain during the trip to ED.  No nitroglycerin given.  Patient was last seen by cardiology 3 years ago and her last stress test was in 2014.  ED Course: Blood pressure elevated, no tachycardia afebrile.  EKG chronic RBBB and T wave.  Troponin negative x 1.  Chest x-ray negative for acute findings    Review of Systems: As per HPI otherwise 14 point review of systems negative.    Past Medical History:  Diagnosis Date   Cancer [facility]) 2004   prostate   Coronary artery disease    Diabetes mellitus type 2 in obese [facility])    Dyslipidemia    Esophageal stricture    Monoclonal paraproteinemia 03/03/2013   Palpitations    RBBB (right bundle branch block)     Past Surgical History:  Procedure Laterality Date   ANKLE SURGERY Left    APPENDECTOMY     CARDIAC CATHETERIZATION     Dr. Martinique   CATARACT EXTRACTION W/  INTRAOCULAR LENS  IMPLANT, BILATERAL     CORONARY ARTERY BYPASS GRAFT  Oct. 2008   x 5,Dr.Gerhardt   HERNIA REPAIR Bilateral    PROSTATECTOMY  2004   radical   right shoulder      replacement   vein strippping       reports that he quit smoking about 54 years ago. His smoking use included cigarettes. He has a 10.00 pack-year smoking history. He has never used smokeless tobacco. He reports current alcohol use. He reports that he does not use drugs.  Allergies  Allergen Reactions   Other      Mepergan Fortis, (meprozine) - itching    Family History  Problem Relation Age of Onset   Heart failure Father        age 82   Heart attack Brother        age 11 post third open heart surgery   Heart attack Brother     Prior to Admission medications   Medication Sig Start Date End Date Taking? Authorizing Provider  aspirin 325 MG tablet Take 325 mg by mouth daily.      [provider]  Black Pepper-Turmeric 3-500 MG CAPS Take 1 tablet po every other day 04/10/19   [provider]  Choline Fenofibrate (FENOFIBRIC ACID) 45 MG CPDR Take by mouth.    [provider]  Cyanocobalamin (VITAMIN B-12 PO)  Take 1 tablet by mouth daily.    [provider]  docusate sodium (COLACE) 100 MG capsule 1 capsule as needed    [provider]  FARXIGA 5 MG TABS tablet Take 5 mg by mouth daily. 02/08/21   [provider]  losartan (COZAAR) 25 MG tablet Take 25 mg by mouth daily. 12/28/20   [provider]  metoprolol succinate (TOPROL-XL) 25 MG 24 hr tablet Take 25 mg by mouth daily. 07/24/14   [provider]  nitroGLYCERIN (NITROSTAT) 0.4 MG SL tablet PLACE 1 TABLET (0.4 MG TOTAL) UNDER THE TONGUE EVERY 5 (FIVE) MINUTES AS NEEDED. Patient not taking: Reported on 03/02/2021 11/07/17   Martinique, Peter M, MD  omeprazole (PRILOSEC) 40 MG capsule Take 40 mg by mouth daily.  01/08/13   [provider]  simvastatin (ZOCOR) 20 MG tablet Take 20 mg  by mouth at bedtime.      [provider]  TRADJENTA 5 MG TABS tablet Take 5 mg by mouth daily. Take 1 tab daily 09/17/15   [provider]  Turmeric 500 MG CAPS Take 500 mg by mouth.    [provider]    Physical Exam: Vitals:   04/20/22 1135 04/20/22 1155 04/20/22 1200 04/20/22 1230  BP: (!) 184/88 (!) 184/88 137/77 137/74  Pulse: 64 (!) 55 (!) 54 (!) 54  Resp:  '19 14 15  '$ Temp:      TempSrc:      SpO2: 100% 98% 98% 97%  Weight:      Height:        Constitutional: NAD, calm, comfortable Vitals:   04/20/22 1135 04/20/22 1155 04/20/22 1200 04/20/22 1230  BP: (!) 184/88 (!) 184/88 137/77 137/74  Pulse: 64 (!) 55 (!) 54 (!) 54  Resp:  '19 14 15  '$ Temp:      TempSrc:      SpO2: 100% 98% 98% 97%  Weight:      Height:       Eyes: PERRL, lids and conjunctivae normal ENMT: Mucous membranes are moist. Posterior pharynx clear of any exudate or lesions.Normal dentition.  Neck: normal, supple, no masses, no thyromegaly Respiratory: clear to auscultation bilaterally, no wheezing, no crackles. Normal respiratory effort. No accessory muscle use.  Cardiovascular: Regular rate and rhythm, no murmurs / rubs / gallops. No extremity edema. 2+ pedal pulses. No carotid bruits.  Abdomen: no tenderness, no masses palpated. No hepatosplenomegaly. Bowel sounds positive.  Musculoskeletal: no clubbing / cyanosis. No joint deformity upper and lower extremities. Good ROM, no contractures. Normal muscle tone.  Skin: no rashes, lesions, ulcers. No induration Neurologic: CN 2-12 grossly intact. Sensation intact, DTR normal. Strength 5/5 in all 4.  Psychiatric: Normal judgment and insight. Alert and oriented x 3. Normal mood.     Labs on Admission: I have personally reviewed following labs and imaging studies  CBC: Recent Labs  Lab 04/20/22 1022  WBC 8.0  HGB 13.4  HCT 40.9  MCV 97.4  PLT 419   Basic Metabolic Panel: Recent Labs  Lab 04/20/22 1022  NA 136  K 4.5  CL  107  CO2 21*  GLUCOSE 306*  BUN 30*  CREATININE 1.59*  CALCIUM 9.2   GFR: Estimated Creatinine Clearance: 33.5 mL/min (A) (by C-G formula based on SCr of 1.59 mg/dL (H)). Liver Function Tests: No results for input(s): "AST", "ALT", "ALKPHOS", "BILITOT", "PROT", "ALBUMIN" in the last 168 hours. No results for input(s): "LIPASE", "AMYLASE" in the last 168 hours. No results for  input(s): "AMMONIA" in the last 168 hours. Coagulation Profile: No results for input(s): "INR", "PROTIME" in the last 168 hours. Cardiac Enzymes: No results for input(s): "CKTOTAL", "CKMB", "CKMBINDEX", "TROPONINI" in the last 168 hours. BNP (last 3 results) No results for input(s): "PROBNP" in the last 8760 hours. HbA1C: No results for input(s): "HGBA1C" in the last 72 hours. CBG: No results for input(s): "GLUCAP" in the last 168 hours. Lipid Profile: No results for input(s): "CHOL", "HDL", "LDLCALC", "TRIG", "CHOLHDL", "LDLDIRECT" in the last 72 hours. Thyroid Function Tests: No results for input(s): "TSH", "T4TOTAL", "FREET4", "T3FREE", "THYROIDAB" in the last 72 hours. Anemia Panel: No results for input(s): "VITAMINB12", "FOLATE", "FERRITIN", "TIBC", "IRON", "RETICCTPCT" in the last 72 hours. Urine analysis:    Component Value Date/Time   COLORURINE YELLOW 07/04/2007 1202   APPEARANCEUR CLEAR 07/04/2007 1202   LABSPEC 1.015 07/04/2007 1202   PHURINE 5.5 07/04/2007 1202   GLUCOSEU NEGATIVE 07/04/2007 1202   HGBUR TRACE (A) 07/04/2007 1202   BILIRUBINUR NEGATIVE 07/04/2007 1202   KETONESUR NEGATIVE 07/04/2007 1202   PROTEINUR NEGATIVE 07/04/2007 1202   UROBILINOGEN 0.2 07/04/2007 1202   NITRITE NEGATIVE 07/04/2007 [place] 07/04/2007 1202    Radiological Exams on Admission: DG Chest 2 View  Result Date: 04/20/2022 CLINICAL DATA:  Chest pain in center of chest pressure radiating to the left arm. EXAM: CHEST - 2 VIEW COMPARISON:  Chest two views 07/24/2007 FINDINGS: Status post  median sternotomy and CABG. Cardiac silhouette and mediastinal contours are within normal limits. Mild calcification within the aortic arch. Unchanged calcified benign granuloma within the inferior medial anterior right lower lobe. Mild bibasilar bronchovascular crowding. No pleural effusion or pneumothorax. Partial visualization of right shoulder arthroplasty. IMPRESSION: No active cardiopulmonary disease. Electronically Signed   By: Yvonne Kendall M.D.   On: 04/20/2022 10:53    EKG: Independently reviewed.  Sinus, chronic RBBB, chronic T wave inversions in septal and anterior leads.  Assessment/Plan Principal Problem:   Chest pain Active Problems:   Unstable angina (HCC)  (please populate well all problems here in Problem List. (For example, if patient is on BP meds at home and you resume or decide to hold them, it is a problem that needs to be her. Same for CAD, COPD, HLD and so on)  Anginal-like chest pain with baseline CAD status post CABG -Offered stress test, echocardiogram, discussed with on-call cardiology who will help [place] to manage this patient. -Continue aspirin, change simvastatin to rosuvastatin  IIDM with hyperglycemia -Continue Tradjenta, hold Farxiga given his kidney function -A1C -Sliding scale, recommend outpatient follow-up with endocrinologist  HTN -Controlled, continue losartan -Hold metoprolol for incoming stress test  HLD -Change simvastatin to rosuvastatin -Continue fenofibrate  CKD stage IIIb -Euvolemic, creatinine level stable, continue losartan.  DVT prophylaxis: Subcu heparin Code Status: DNR Family Communication: Son at bedside Disposition Plan: Back less than 2 midnight hospital stay Consults called: Cardiology Admission status: Telemetry observation   Lequita Halt MD [facility] Pager [phone]  04/20/2022, 1:12 PM

## 2022-04-20 NOTE — ED Triage Notes (Signed)
Chest pain in the center of his chest like pressure radiating to left arm but reports to ems it had subsided when they got there but now has come back since arriving. Denies SOB n.v

## 2022-04-20 NOTE — ED Notes (Signed)
Pt reports onset of chest tightness this morning while walking. Pt reports it started w/ a light pain in his L arm and then the same in his R arm and then had central chest tightness. Pt denies any lightheadedness, N/V, dizziness, LOC, shob. Pt reports when he is at rest he doesn't have any CP.

## 2022-04-20 NOTE — ED Provider Triage Note (Signed)
Emergency Medicine Provider Triage Evaluation Note  Tony Lee , a 88 y.o. male  was evaluated in triage.  Pt complains of chest pain around 0815 this AM. Pressure feeling in central chest radiating to left arm. '324mg'$  of ASA given. No SOB, nausea, or vomiting. Reports he has had several CABGs in the past and this feels similar.  Review of Systems  Positive:  Negative:   Physical Exam  There were no vitals taken for this visit. Gen:   Awake, no distress   Resp:  Normal effort  MSK:   Moves extremities without difficulty  Other:  bradycardia  Medical Decision Making  Medically screening exam initiated at 10:13 AM.  Appropriate orders placed.  Tony Lee was informed that the remainder of the evaluation will be completed by another provider, this initial triage assessment does not replace that evaluation, and the importance of remaining in the ED until their evaluation is complete.  Will order Chest pain work up. Triage nurse alerted that the patient needs to be roomed immediately.    Sherrell Puller, PA-C 04/20/22 1018

## 2022-04-21 DIAGNOSIS — R072 Precordial pain: Secondary | ICD-10-CM | POA: Diagnosis not present

## 2022-04-21 DIAGNOSIS — I214 Non-ST elevation (NSTEMI) myocardial infarction: Secondary | ICD-10-CM | POA: Diagnosis present

## 2022-04-21 DIAGNOSIS — I200 Unstable angina: Secondary | ICD-10-CM

## 2022-04-21 DIAGNOSIS — R079 Chest pain, unspecified: Secondary | ICD-10-CM | POA: Diagnosis present

## 2022-04-21 DIAGNOSIS — D472 Monoclonal gammopathy: Secondary | ICD-10-CM | POA: Diagnosis present

## 2022-04-21 DIAGNOSIS — I2582 Chronic total occlusion of coronary artery: Secondary | ICD-10-CM | POA: Diagnosis present

## 2022-04-21 DIAGNOSIS — E669 Obesity, unspecified: Secondary | ICD-10-CM | POA: Diagnosis present

## 2022-04-21 DIAGNOSIS — N1832 Chronic kidney disease, stage 3b: Secondary | ICD-10-CM | POA: Diagnosis present

## 2022-04-21 DIAGNOSIS — Z7982 Long term (current) use of aspirin: Secondary | ICD-10-CM | POA: Diagnosis not present

## 2022-04-21 DIAGNOSIS — Z66 Do not resuscitate: Secondary | ICD-10-CM | POA: Diagnosis present

## 2022-04-21 DIAGNOSIS — I440 Atrioventricular block, first degree: Secondary | ICD-10-CM | POA: Diagnosis present

## 2022-04-21 DIAGNOSIS — Z7984 Long term (current) use of oral hypoglycemic drugs: Secondary | ICD-10-CM | POA: Diagnosis not present

## 2022-04-21 DIAGNOSIS — E782 Mixed hyperlipidemia: Secondary | ICD-10-CM | POA: Diagnosis not present

## 2022-04-21 DIAGNOSIS — I252 Old myocardial infarction: Secondary | ICD-10-CM | POA: Diagnosis not present

## 2022-04-21 DIAGNOSIS — E1122 Type 2 diabetes mellitus with diabetic chronic kidney disease: Secondary | ICD-10-CM | POA: Diagnosis present

## 2022-04-21 DIAGNOSIS — I4510 Unspecified right bundle-branch block: Secondary | ICD-10-CM | POA: Diagnosis present

## 2022-04-21 DIAGNOSIS — R410 Disorientation, unspecified: Secondary | ICD-10-CM | POA: Diagnosis not present

## 2022-04-21 DIAGNOSIS — I129 Hypertensive chronic kidney disease with stage 1 through stage 4 chronic kidney disease, or unspecified chronic kidney disease: Secondary | ICD-10-CM | POA: Diagnosis present

## 2022-04-21 DIAGNOSIS — I10 Essential (primary) hypertension: Secondary | ICD-10-CM | POA: Diagnosis not present

## 2022-04-21 DIAGNOSIS — I25110 Atherosclerotic heart disease of native coronary artery with unstable angina pectoris: Secondary | ICD-10-CM | POA: Diagnosis present

## 2022-04-21 DIAGNOSIS — I2510 Atherosclerotic heart disease of native coronary artery without angina pectoris: Secondary | ICD-10-CM | POA: Diagnosis not present

## 2022-04-21 DIAGNOSIS — Z951 Presence of aortocoronary bypass graft: Secondary | ICD-10-CM | POA: Diagnosis not present

## 2022-04-21 DIAGNOSIS — E1165 Type 2 diabetes mellitus with hyperglycemia: Secondary | ICD-10-CM | POA: Diagnosis present

## 2022-04-21 DIAGNOSIS — R001 Bradycardia, unspecified: Secondary | ICD-10-CM | POA: Diagnosis not present

## 2022-04-21 DIAGNOSIS — Z79899 Other long term (current) drug therapy: Secondary | ICD-10-CM | POA: Diagnosis not present

## 2022-04-21 DIAGNOSIS — E785 Hyperlipidemia, unspecified: Secondary | ICD-10-CM | POA: Diagnosis present

## 2022-04-21 DIAGNOSIS — F32A Depression, unspecified: Secondary | ICD-10-CM | POA: Diagnosis present

## 2022-04-21 DIAGNOSIS — Z8546 Personal history of malignant neoplasm of prostate: Secondary | ICD-10-CM | POA: Diagnosis not present

## 2022-04-21 DIAGNOSIS — F0390 Unspecified dementia without behavioral disturbance: Secondary | ICD-10-CM | POA: Diagnosis present

## 2022-04-21 DIAGNOSIS — Z888 Allergy status to other drugs, medicaments and biological substances status: Secondary | ICD-10-CM | POA: Diagnosis not present

## 2022-04-21 LAB — COMPREHENSIVE METABOLIC PANEL
ALT: 11 U/L (ref 0–44)
AST: 13 U/L — ABNORMAL LOW (ref 15–41)
Albumin: 3.1 g/dL — ABNORMAL LOW (ref 3.5–5.0)
Alkaline Phosphatase: 39 U/L (ref 38–126)
Anion gap: 8 (ref 5–15)
BUN: 26 mg/dL — ABNORMAL HIGH (ref 8–23)
CO2: 26 mmol/L (ref 22–32)
Calcium: 9.3 mg/dL (ref 8.9–10.3)
Chloride: 105 mmol/L (ref 98–111)
Creatinine, Ser: 1.57 mg/dL — ABNORMAL HIGH (ref 0.61–1.24)
GFR, Estimated: 42 mL/min — ABNORMAL LOW (ref >60)
Glucose, Bld: 154 mg/dL — ABNORMAL HIGH (ref 70–99)
Potassium: 4.1 mmol/L (ref 3.5–5.1)
Sodium: 139 mmol/L (ref 135–145)
Total Bilirubin: 0.6 mg/dL (ref 0.3–1.2)
Total Protein: 5.9 g/dL — ABNORMAL LOW (ref 6.5–8.1)

## 2022-04-21 LAB — LIPID PANEL
Cholesterol: 105 mg/dL (ref 0–200)
HDL: 37 mg/dL — ABNORMAL LOW (ref >40)
LDL Cholesterol: 42 mg/dL (ref 0–99)
Total CHOL/HDL Ratio: 2.8 RATIO
Triglycerides: 132 mg/dL (ref <150)
VLDL: 26 mg/dL (ref 0–40)

## 2022-04-21 LAB — GLUCOSE, CAPILLARY
Glucose-Capillary: 223 mg/dL — ABNORMAL HIGH (ref 70–99)
Glucose-Capillary: 226 mg/dL — ABNORMAL HIGH (ref 70–99)
Glucose-Capillary: 170 mg/dL — ABNORMAL HIGH (ref 70–99)
Glucose-Capillary: 166 mg/dL — ABNORMAL HIGH (ref 70–99)

## 2022-04-21 LAB — TROPONIN I (HIGH SENSITIVITY)
Troponin I (High Sensitivity): 72 ng/L — ABNORMAL HIGH (ref <18)
Troponin I (High Sensitivity): 49 ng/L — ABNORMAL HIGH (ref <18)
Troponin I (High Sensitivity): 53 ng/L — ABNORMAL HIGH (ref <18)

## 2022-04-21 LAB — HEPARIN LEVEL (UNFRACTIONATED): Heparin Unfractionated: 0.33 IU/mL (ref 0.30–0.70)

## 2022-04-21 LAB — TSH: TSH: 3.233 u[IU]/mL (ref 0.350–4.500)

## 2022-04-21 MED ORDER — HEPARIN (PORCINE) 25000 UT/250ML-% IV SOLN
1000.0000 [IU]/h | INTRAVENOUS | Status: DC
Start: 2022-04-21 — End: 2022-04-22
  Administered 2022-04-21 – 2022-04-22 (×2): 1000 [IU]/h via INTRAVENOUS
  Filled 2022-04-21 (×2): qty 250

## 2022-04-21 MED ORDER — HEPARIN BOLUS VIA INFUSION
2000.0000 [IU] | Freq: Once | INTRAVENOUS | Status: AC
  Administered 2022-04-21: 2000 [IU] via INTRAVENOUS
  Filled 2022-04-21: qty 2000

## 2022-04-21 MED ORDER — METOPROLOL SUCCINATE ER 25 MG PO TB24
12.5000 mg | ORAL_TABLET | Freq: Every day | ORAL | Status: DC
  Administered 2022-04-21: 12.5 mg via ORAL
  Filled 2022-04-21: qty 1

## 2022-04-21 MED ORDER — NITROGLYCERIN 0.4 MG SL SUBL
0.4000 mg | SUBLINGUAL_TABLET | SUBLINGUAL | Status: DC | PRN
  Administered 2022-04-21 – 2022-04-22 (×4): 0.4 mg via SUBLINGUAL
  Filled 2022-04-21 (×2): qty 1

## 2022-04-21 NOTE — Progress Notes (Signed)
PROGRESS NOTE    Tony Lee  YBO:[id number] DOB: Jan 31, 1931 DOA: 04/20/2022 PCP: Haywood Pao, MD   Brief Narrative:  Tony Lee is a 88 y.o. male with medical history significant of CAD/MI s/p CABG with 6 vessels in 2018, with a normal nuclear stress test 2018, HTN, IIDM, CKD stage IIIb, HLD, presented with chest pain.   Patient ate a regular breakfast, while walking outside in the community, started to feel left arm heaviness and then soon the right arm also had sharp pain.  Soon afterwards, he started to feel pressure-like chest pain associated with diaphoresis and shortness of breath.  No palpitations no lightheadedness no nauseous vomiting.  He had to sit down to let the episode pass, and chest pain did resolved in 3 to 5 minutes before EMS arrived. patient had another episode of chest pain during the trip to ED.  No nitroglycerin given.  Patient was last seen by cardiology 3 years ago and his last stress test was in 2014.   ED Course: Blood pressure elevated, no tachycardia afebrile.  EKG chronic RBBB and T wave.  Troponin negative x 1.  Chest x-ray negative for acute findings  Assessment & Plan:   Principal Problem:   Chest pain Active Problems:   Unstable angina ([place])   #1 chest pain consistent with his prior angina.  Patient with history of CAD and CABG x6 in 2018.  Patient admitted with pain in both arms with ambulation.  He continued to have this pain today while ambulating with the staff.  His troponin peaked at 79.  And is trending down.  Patient followed by cardiology started heparin drip.  Planning for cath on Monday.  Continuing aspirin and Norvasc beta-blocker Crestor losartan and nitro.  #2.  History of essential hypertension continue above medications.  #3.  Stage IIIb CKD-stable on losartan monitor  #4 type 2 diabetes with hyperglycemia last A1c 8.0.  On Tradjenta and Farxiga prior to admission. Continue SSI.  #5 hyperlipidemia on Crestor and  fenofibrate   Estimated body mass index is 28.3 kg/m as calculated from the following:   Height as of this encounter: '5\' 7"'$  (1.702 m).   Weight as of this encounter: 82 kg.  DVT prophylaxis: Heparin drip Code Status: DNR  family Communication: None at bedside  disposition Plan:  Status is: Inpatient Remains inpatient appropriate because: Unstable angina on heparin drip   Consultants:  Cardiology  Procedures: None  Antimicrobials: None   Subjective: Patient was talking on the phone with his wife when I walked into the room after a while a reported he has pain in both upper extremities Objective: Vitals:   04/21/22 1100 04/21/22 1112 04/21/22 1143 04/21/22 1257  BP: (!) 140/68 112/69 105/62 121/60  Pulse: 70  64   Resp: (!) '23  18 19  '$ Temp:  98.6 F (37 C) 98.4 F (36.9 C)   TempSrc:  Oral Oral   SpO2: 94% 95% 96%   Weight:      Height:        Intake/Output Summary (Last 24 hours) at 04/21/2022 1340 Last data filed at 04/21/2022 1330 Gross per 24 hour  Intake 360 ml  Output 1800 ml  Net -1440 ml   Filed Weights   04/20/22 1015 04/21/22 0000  Weight: 88.5 kg 82 kg    Examination:  General exam: Appears calm and comfortable  Respiratory system: Clear to auscultation. Respiratory effort normal. Cardiovascular system: S1 & S2 heard, RRR. No JVD, murmurs,  rubs, gallops or clicks. No pedal edema. Gastrointestinal system: Abdomen is nondistended, soft and nontender. No organomegaly or masses felt. Normal bowel sounds heard. Central nervous system: Alert and oriented. No focal neurological deficits. Extremities: Trace edema bilaterally Skin: No rashes, lesions or ulcers Psychiatry: Judgement and insight appear normal. Mood & affect appropriate.     Data Reviewed: I have personally reviewed following labs and imaging studies  CBC: Recent Labs  Lab 04/20/22 1022  WBC 8.0  HGB 13.4  HCT 40.9  MCV 97.4  PLT 161   Basic Metabolic Panel: Recent Labs  Lab  04/20/22 1022 04/21/22 0308  NA 136 139  K 4.5 4.1  CL 107 105  CO2 21* 26  GLUCOSE 306* 154*  BUN 30* 26*  CREATININE 1.59* 1.57*  CALCIUM 9.2 9.3   GFR: Estimated Creatinine Clearance: 32.7 mL/min (A) (by C-G formula based on SCr of 1.57 mg/dL (H)). Liver Function Tests: Recent Labs  Lab 04/21/22 0308  AST 13*  ALT 11  ALKPHOS 39  BILITOT 0.6  PROT 5.9*  ALBUMIN 3.1*   No results for input(s): "LIPASE", "AMYLASE" in the last 168 hours. No results for input(s): "AMMONIA" in the last 168 hours. Coagulation Profile: No results for input(s): "INR", "PROTIME" in the last 168 hours. Cardiac Enzymes: No results for input(s): "CKTOTAL", "CKMB", "CKMBINDEX", "TROPONINI" in the last 168 hours. BNP (last 3 results) No results for input(s): "PROBNP" in the last 8760 hours. HbA1C: Recent Labs    04/20/22 1531  HGBA1C 8.0*   CBG: Recent Labs  Lab 04/20/22 1735 04/20/22 2310 04/21/22 0626 04/21/22 1150  GLUCAP 182* 131* 223* 226*   Lipid Profile: Recent Labs    04/21/22 0308  CHOL 105  HDL 37*  LDLCALC 42  TRIG 132  CHOLHDL 2.8   Thyroid Function Tests: Recent Labs    04/21/22 0308  TSH 3.233   Anemia Panel: No results for input(s): "VITAMINB12", "FOLATE", "FERRITIN", "TIBC", "IRON", "RETICCTPCT" in the last 72 hours. Sepsis Labs: No results for input(s): "PROCALCITON", "LATICACIDVEN" in the last 168 hours.  No results found for this or any previous visit (from the past 240 hour(s)).       Radiology Studies: ECHOCARDIOGRAM COMPLETE  Result Date: 04/20/2022    ECHOCARDIOGRAM REPORT   Patient Name:   Tony Lee Date of Exam: 04/20/2022 Medical Rec #:  [id number]       Height:       67.0 in Accession #:    [id number]      Weight:       195.0 lb Date of Birth:  [birth year]       BSA:          2.001 m Patient Age:    73 years        BP:           180/86 mmHg Patient Gender: M               HR:           53 bpm. Exam Location:  Inpatient Procedure: 2D  Echo, Cardiac Doppler and Color Doppler Indications:    Chest pain  History:        Patient has no prior history of Echocardiogram examinations.                 Previous Myocardial Infarction and CAD, Prior CABG;                 Arrythmias:Bradycardia.  Sonographer:  [place] Referring Phys: [id number] [address]  1. Left ventricular ejection fraction, by estimation, is 60 to 65%. The left ventricle has normal function. The left ventricle has no regional wall motion abnormalities. There is mild left ventricular hypertrophy of the basal-septal segment. Left ventricular diastolic parameters are consistent with Grade I diastolic dysfunction (impaired relaxation).  2. Right ventricular systolic function is normal. The right ventricular size is normal.  3. The mitral valve is normal in structure. No evidence of mitral valve regurgitation. No evidence of mitral stenosis.  4. The aortic valve is tricuspid. Aortic valve regurgitation is trivial. Aortic valve sclerosis/calcification is present, without any evidence of aortic stenosis.  5. The inferior vena cava is normal in size with greater than 50% respiratory variability, suggesting right atrial pressure of 3 mmHg. FINDINGS  Left Ventricle: Left ventricular ejection fraction, by estimation, is 60 to 65%. The left ventricle has normal function. The left ventricle has no regional wall motion abnormalities. The left ventricular internal cavity size was normal in size. There is  mild left ventricular hypertrophy of the basal-septal segment. Left ventricular diastolic parameters are consistent with Grade I diastolic dysfunction (impaired relaxation). Normal left ventricular filling pressure. Right Ventricle: The right ventricular size is normal. No increase in right ventricular wall thickness. Right ventricular systolic function is normal. Left Atrium: Left atrial size was normal in size. Right Atrium: Right atrial size was normal in size. Pericardium:  There is no evidence of pericardial effusion. Mitral Valve: The mitral valve is normal in structure. No evidence of mitral valve regurgitation. No evidence of mitral valve stenosis. Tricuspid Valve: The tricuspid valve is normal in structure. Tricuspid valve regurgitation is trivial. No evidence of tricuspid stenosis. Aortic Valve: The aortic valve is tricuspid. Aortic valve regurgitation is trivial. Aortic valve sclerosis/calcification is present, without any evidence of aortic stenosis. Pulmonic Valve: The pulmonic valve was normal in structure. Pulmonic valve regurgitation is trivial. No evidence of pulmonic stenosis. Aorta: The aortic root is normal in size and structure. Venous: The inferior vena cava is normal in size with greater than 50% respiratory variability, suggesting right atrial pressure of 3 mmHg. IAS/Shunts: No atrial level shunt detected by color flow Doppler.  LEFT VENTRICLE PLAX 2D LVIDd:         4.50 cm   Diastology LVIDs:         3.00 cm   LV e' medial:    6.64 cm/s LV PW:         0.90 cm   LV E/e' medial:  8.5 LV IVS:        0.90 cm   LV e' lateral:   9.14 cm/s LVOT diam:     2.10 cm   LV E/e' lateral: 6.2 LV SV:         73 LV SV Index:   37 LVOT Area:     3.46 cm  RIGHT VENTRICLE RV Basal diam:  2.90 cm RV S prime:     6.60 cm/s TAPSE (M-mode): 1.5 cm LEFT ATRIUM             Index        RIGHT ATRIUM           Index LA diam:        4.30 cm 2.15 cm/m   RA Area:     18.30 cm LA Vol (A2C):   51.7 ml 25.84 ml/m  RA Volume:   48.30 ml  24.14 ml/m LA Vol (A4C):  52.0 ml 25.99 ml/m LA Biplane Vol: 51.9 ml 25.94 ml/m  AORTIC VALVE LVOT Vmax:   94.20 cm/s LVOT Vmean:  59.100 cm/s LVOT VTI:    0.212 m  AORTA Ao Root diam: 3.50 cm Ao Asc diam:  3.10 cm MITRAL VALVE MV Area (PHT): 2.66 cm    SHUNTS MV Decel Time: 285 msec    Systemic VTI:  0.21 m MV E velocity: 56.40 cm/s  Systemic Diam: 2.10 cm MV A velocity: 70.60 cm/s MV E/A ratio:  0.80 Fransico Him MD Electronically signed by Fransico Him  MD Signature Date/Time: 04/20/2022/3:00:09 PM    Final    DG Chest 2 View  Result Date: 04/20/2022 CLINICAL DATA:  Chest pain in center of chest pressure radiating to the left arm. EXAM: CHEST - 2 VIEW COMPARISON:  Chest two views 07/24/2007 FINDINGS: Status post median sternotomy and CABG. Cardiac silhouette and mediastinal contours are within normal limits. Mild calcification within the aortic arch. Unchanged calcified benign granuloma within the inferior medial anterior right lower lobe. Mild bibasilar bronchovascular crowding. No pleural effusion or pneumothorax. Partial visualization of right shoulder arthroplasty. IMPRESSION: No active cardiopulmonary disease. Electronically Signed   By: Yvonne Kendall M.D.   On: 04/20/2022 10:53        Scheduled Meds:  amLODipine  5 mg Oral Daily   aspirin EC  81 mg Oral Daily   fenofibrate  160 mg Oral Daily   insulin aspart  0-9 Units Subcutaneous TID WC   linagliptin  5 mg Oral Daily   losartan  50 mg Oral Daily   metoprolol succinate  12.5 mg Oral Daily   pantoprazole  40 mg Oral Daily   rosuvastatin  40 mg Oral Daily   Continuous Infusions:  heparin 1,000 Units/hr (04/21/22 1139)     LOS: 0 days    Time spent: 35 min  Georgette Shell, MD  04/21/2022, 1:40 PM

## 2022-04-21 NOTE — Progress Notes (Signed)
[place] for heparin  Indication: chest pain/ACS  Allergies  Allergen Reactions   Other Itching     Mepergan Fortis, (meprozine)  = itching (patient disputes this in 2023)    Patient Measurements: Height: '5\' 7"'$  (170.2 cm) Weight: 82 kg (180 lb 11.2 oz) IBW/kg (Calculated) : 66.1 HEPARIN DW (KG): 82   Vital Signs: Temp: 98 F (36.7 C) (08/12 1625) Temp Source: Oral (08/12 1625) BP: 120/72 (08/12 1625) Pulse Rate: 64 (08/12 1625)  Labs: Recent Labs    04/20/22 1022 04/20/22 1228 04/21/22 0308 04/21/22 1001 04/21/22 1238 04/21/22 2027  HGB 13.4  --   --   --   --   --   HCT 40.9  --   --   --   --   --   PLT 240  --   --   --   --   --   HEPARINUNFRC  --   --   --   --   --  0.33  CREATININE 1.59*  --  1.57*  --   --   --   TROPONINIHS 9   < > 72* 49* 53*  --    < > = values in this interval not displayed.     Estimated Creatinine Clearance: 32.7 mL/min (A) (by C-G formula based on SCr of 1.57 mg/dL (H)).  Assessment: 88 YO M with PMH significant for CAD/MI s/p CABG (2018), HTN, CKDIII, and HLD presenting the ED with chest pain. This AM increasing chest and arm pain with troponin trending up warranting ACS workup. No known home AC PTA. On asa 81 mg daily. Patient has received SQ heparin this AM for DVT ppx. Pharmacy consulted to dose heparin.   Heparin level therapeutic    Goal of Therapy:  Heparin level 0.3-0.7 units/ml Monitor platelets by anticoagulation protocol: Yes   Plan:  Continue heparin at 1000 units / hr Monitor daily heparin level and CBC Continue to monitor H&H    Thank you Anette Guarneri, PharmD  04/21/2022 9:13 PM

## 2022-04-21 NOTE — Progress Notes (Signed)
ANTICOAGULATION CONSULT NOTE - Initial Consult  Pharmacy Consult for heparin  Indication: chest pain/ACS  Allergies  Allergen Reactions   Other Itching     Mepergan Fortis, (meprozine)  = itching (patient disputes this in 2023)    Patient Measurements: Height: '5\' 7"'$  (170.2 cm) Weight: 82 kg (180 lb 11.2 oz) IBW/kg (Calculated) : 66.1 HEPARIN DW (KG): 82   Vital Signs: Temp: 98.4 F (36.9 C) (08/12 0747) Temp Source: Oral (08/12 0747) BP: 131/84 (08/12 0747) Pulse Rate: 70 (08/12 0747)  Labs: Recent Labs    04/20/22 1022 04/20/22 1228 04/20/22 1531 04/21/22 0308  HGB 13.4  --   --   --   HCT 40.9  --   --   --   PLT 240  --   --   --   CREATININE 1.59*  --   --  1.57*  TROPONINIHS 9 19* 29* 72*    Estimated Creatinine Clearance: 32.7 mL/min (A) (by C-G formula based on SCr of 1.57 mg/dL (H)).   Medical History: Past Medical History:  Diagnosis Date   Cancer ([place]) 09/10/2002   prostate   Chronic kidney disease, stage 3b (HCC)    Coronary artery disease    Depression    Diabetes mellitus type 2 in obese (HCC)    Dyslipidemia    Esophageal stricture    Monoclonal paraproteinemia 03/03/2013   Palpitations    RBBB (right bundle branch block)      Assessment: 88 YO M with PMH significant for CAD/MI s/p CABG (2018), HTN, CKDIII, and HLD presenting the ED with chest pain. This AM increasing chest and arm pain with troponin trending up warranting ACS workup. No known home AC PTA. On asa 81 mg daily. Patient has received SQ heparin this AM for DVT ppx. Pharmacy consulted to dose heparin.   Goal of Therapy:  Heparin level 0.3-0.7 units/ml Monitor platelets by anticoagulation protocol: Yes   Plan:  Give reduced heparin bolus 2000 units  Start heparin infusion at 1000 units/hr  Will f/u heparin level/aPTT in 8 hours Monitor daily heparin level and CBC Continue to monitor H&H     Gena Fray, PharmD PGY1 Pharmacy Resident   04/21/2022 10:58  AM

## 2022-04-21 NOTE — Progress Notes (Signed)
Pt's son Jesstin Studstill [phone]) would like the morning rounding MDs to call for updates to plan of care.

## 2022-04-21 NOTE — Progress Notes (Signed)
   Cath orders entered for Monday 8/14. Patient has GFR 42. Fluids to start day before cath. Losartan DC'd. Can restart post cath. [name] Dopp, PA-C    04/21/2022 2:14 PM

## 2022-04-22 ENCOUNTER — Inpatient Hospital Stay (HOSPITAL_COMMUNITY)
Admission: EM | Disposition: A | Discharge status: Skilled Nursing Facility | Payer: Self-pay | Source: Skilled Nursing Facility | Attending: Internal Medicine

## 2022-04-22 DIAGNOSIS — I2510 Atherosclerotic heart disease of native coronary artery without angina pectoris: Secondary | ICD-10-CM

## 2022-04-22 DIAGNOSIS — I25110 Atherosclerotic heart disease of native coronary artery with unstable angina pectoris: Secondary | ICD-10-CM | POA: Diagnosis not present

## 2022-04-22 DIAGNOSIS — I214 Non-ST elevation (NSTEMI) myocardial infarction: Secondary | ICD-10-CM | POA: Diagnosis not present

## 2022-04-22 DIAGNOSIS — R072 Precordial pain: Secondary | ICD-10-CM | POA: Diagnosis not present

## 2022-04-22 DIAGNOSIS — I10 Essential (primary) hypertension: Secondary | ICD-10-CM | POA: Diagnosis not present

## 2022-04-22 HISTORY — PX: CORONARY/GRAFT ACUTE MI REVASCULARIZATION: CATH118305

## 2022-04-22 HISTORY — PX: LEFT HEART CATH AND CORONARY ANGIOGRAPHY: CATH118249

## 2022-04-22 LAB — BASIC METABOLIC PANEL
Anion gap: 7 (ref 5–15)
Anion gap: 8 (ref 5–15)
BUN: 26 mg/dL — ABNORMAL HIGH (ref 8–23)
BUN: 25 mg/dL — ABNORMAL HIGH (ref 8–23)
CO2: 23 mmol/L (ref 22–32)
CO2: 19 mmol/L — ABNORMAL LOW (ref 22–32)
Calcium: 9.1 mg/dL (ref 8.9–10.3)
Calcium: 8.9 mg/dL (ref 8.9–10.3)
Chloride: 105 mmol/L (ref 98–111)
Chloride: 107 mmol/L (ref 98–111)
Creatinine, Ser: 1.59 mg/dL — ABNORMAL HIGH (ref 0.61–1.24)
Creatinine, Ser: 1.67 mg/dL — ABNORMAL HIGH (ref 0.61–1.24)
GFR, Estimated: 41 mL/min — ABNORMAL LOW (ref >60)
GFR, Estimated: 39 mL/min — ABNORMAL LOW (ref >60)
Glucose, Bld: 173 mg/dL — ABNORMAL HIGH (ref 70–99)
Glucose, Bld: 192 mg/dL — ABNORMAL HIGH (ref 70–99)
Potassium: 4.4 mmol/L (ref 3.5–5.1)
Potassium: 4 mmol/L (ref 3.5–5.1)
Sodium: 135 mmol/L (ref 135–145)
Sodium: 134 mmol/L — ABNORMAL LOW (ref 135–145)

## 2022-04-22 LAB — GLUCOSE, CAPILLARY
Glucose-Capillary: 158 mg/dL — ABNORMAL HIGH (ref 70–99)
Glucose-Capillary: 160 mg/dL — ABNORMAL HIGH (ref 70–99)
Glucose-Capillary: 174 mg/dL — ABNORMAL HIGH (ref 70–99)
Glucose-Capillary: 192 mg/dL — ABNORMAL HIGH (ref 70–99)

## 2022-04-22 LAB — MAGNESIUM: Magnesium: 1.8 mg/dL (ref 1.7–2.4)

## 2022-04-22 LAB — CBC
HCT: 41 % (ref 39.0–52.0)
HCT: 37.7 % — ABNORMAL LOW (ref 39.0–52.0)
Hemoglobin: 14.2 g/dL (ref 13.0–17.0)
Hemoglobin: 13.1 g/dL (ref 13.0–17.0)
MCH: 32.6 pg (ref 26.0–34.0)
MCH: 32.5 pg (ref 26.0–34.0)
MCHC: 34.6 g/dL (ref 30.0–36.0)
MCHC: 34.7 g/dL (ref 30.0–36.0)
MCV: 94 fL (ref 80.0–100.0)
MCV: 93.5 fL (ref 80.0–100.0)
Platelets: 245 10*3/uL (ref 150–400)
Platelets: 238 10*3/uL (ref 150–400)
RBC: 4.36 MIL/uL (ref 4.22–5.81)
RBC: 4.03 MIL/uL — ABNORMAL LOW (ref 4.22–5.81)
RDW: 12.1 % (ref 11.5–15.5)
RDW: 11.9 % (ref 11.5–15.5)
WBC: 10 10*3/uL (ref 4.0–10.5)
WBC: 9.8 10*3/uL (ref 4.0–10.5)
nRBC: 0 % (ref 0.0–0.2)
nRBC: 0 % (ref 0.0–0.2)

## 2022-04-22 LAB — POCT I-STAT, CHEM 8
BUN: 25 mg/dL — ABNORMAL HIGH (ref 8–23)
Calcium, Ion: 1.28 mmol/L (ref 1.15–1.40)
Chloride: 103 mmol/L (ref 98–111)
Creatinine, Ser: 1.8 mg/dL — ABNORMAL HIGH (ref 0.61–1.24)
Glucose, Bld: 198 mg/dL — ABNORMAL HIGH (ref 70–99)
HCT: 38 % — ABNORMAL LOW (ref 39.0–52.0)
Hemoglobin: 12.9 g/dL — ABNORMAL LOW (ref 13.0–17.0)
Potassium: 4.1 mmol/L (ref 3.5–5.1)
Sodium: 137 mmol/L (ref 135–145)
TCO2: 19 mmol/L — ABNORMAL LOW (ref 22–32)

## 2022-04-22 LAB — TROPONIN I (HIGH SENSITIVITY)
Troponin I (High Sensitivity): 34 ng/L — ABNORMAL HIGH (ref <18)
Troponin I (High Sensitivity): 114 ng/L (ref <18)
Troponin I (High Sensitivity): 747 ng/L (ref <18)
Troponin I (High Sensitivity): 1042 ng/L (ref <18)

## 2022-04-22 LAB — HEPARIN LEVEL (UNFRACTIONATED): Heparin Unfractionated: 0.47 IU/mL (ref 0.30–0.70)

## 2022-04-22 LAB — MRSA NEXT GEN BY PCR, NASAL: MRSA by PCR Next Gen: NOT DETECTED

## 2022-04-22 SURGERY — CORONARY/GRAFT ACUTE MI REVASCULARIZATION
Anesthesia: LOCAL

## 2022-04-22 MED ORDER — SODIUM CHLORIDE 0.9% FLUSH: 3.0000 mL | Freq: Two times a day (BID) | INTRAVENOUS | Status: DC

## 2022-04-22 MED ORDER — HEPARIN SODIUM (PORCINE) 1000 UNIT/ML IJ SOLN
INTRAMUSCULAR | Status: DC | PRN
  Administered 2022-04-22: 8000 [IU] via INTRAVENOUS

## 2022-04-22 MED ORDER — NITROGLYCERIN 1 MG/10 ML FOR IR/CATH LAB
INTRA_ARTERIAL | Status: DC | PRN
  Administered 2022-04-22 (×2): 200 ug via INTRACORONARY

## 2022-04-22 MED ORDER — VERAPAMIL HCL 2.5 MG/ML IV SOLN
INTRAVENOUS | Status: AC
  Filled 2022-04-22: qty 2

## 2022-04-22 MED ORDER — MORPHINE SULFATE (PF) 2 MG/ML IV SOLN
INTRAVENOUS | Status: AC
  Filled 2022-04-22: qty 1

## 2022-04-22 MED ORDER — HEPARIN (PORCINE) IN NACL 1000-0.9 UT/500ML-% IV SOLN
INTRAVENOUS | Status: AC
  Filled 2022-04-22: qty 1000

## 2022-04-22 MED ORDER — TICAGRELOR 90 MG PO TABS
90.0000 mg | ORAL_TABLET | Freq: Two times a day (BID) | ORAL | Status: DC
  Administered 2022-04-23: 90 mg via ORAL
  Filled 2022-04-22: qty 1

## 2022-04-22 MED ORDER — HEPARIN SODIUM (PORCINE) 1000 UNIT/ML IJ SOLN
INTRAMUSCULAR | Status: AC
  Filled 2022-04-22: qty 10

## 2022-04-22 MED ORDER — SODIUM CHLORIDE 0.9% FLUSH: 3.0000 mL | INTRAVENOUS | Status: DC | PRN

## 2022-04-22 MED ORDER — MORPHINE SULFATE (PF) 2 MG/ML IV SOLN
2.0000 mg | INTRAVENOUS | Status: DC | PRN
  Filled 2022-04-22: qty 1

## 2022-04-22 MED ORDER — FENTANYL CITRATE (PF) 100 MCG/2ML IJ SOLN
INTRAMUSCULAR | Status: DC | PRN
  Administered 2022-04-22: 50 ug via INTRAVENOUS

## 2022-04-22 MED ORDER — LIDOCAINE HCL (PF) 1 % IJ SOLN
INTRAMUSCULAR | Status: DC | PRN
  Administered 2022-04-22: 10 mL

## 2022-04-22 MED ORDER — NITROGLYCERIN 1 MG/10 ML FOR IR/CATH LAB
INTRA_ARTERIAL | Status: AC
  Filled 2022-04-22: qty 10

## 2022-04-22 MED ORDER — TICAGRELOR 90 MG PO TABS
ORAL_TABLET | ORAL | Status: DC | PRN
  Administered 2022-04-22: 180 mg via ORAL

## 2022-04-22 MED ORDER — FENTANYL CITRATE (PF) 100 MCG/2ML IJ SOLN
INTRAMUSCULAR | Status: AC
  Filled 2022-04-22: qty 2

## 2022-04-22 MED ORDER — LABETALOL HCL 5 MG/ML IV SOLN
10.0000 mg | INTRAVENOUS | Status: AC | PRN
Start: 2022-04-22 — End: 2022-04-23

## 2022-04-22 MED ORDER — CHLORHEXIDINE GLUCONATE CLOTH 2 % EX PADS
6.0000 | MEDICATED_PAD | Freq: Every day | CUTANEOUS | Status: DC
  Administered 2022-04-22 – 2022-04-23 (×2): 6 via TOPICAL

## 2022-04-22 MED ORDER — SODIUM CHLORIDE 0.9 % IV SOLN: 250.0000 mL | INTRAVENOUS | Status: DC | PRN

## 2022-04-22 MED ORDER — SODIUM CHLORIDE 0.9 % WEIGHT BASED INFUSION
1.0000 mL/kg/h | INTRAVENOUS | Status: DC
  Administered 2022-04-22: 1 mL/kg/h via INTRAVENOUS

## 2022-04-22 MED ORDER — TICAGRELOR 90 MG PO TABS
ORAL_TABLET | ORAL | Status: AC
  Filled 2022-04-22: qty 2

## 2022-04-22 MED ORDER — IOHEXOL 350 MG/ML SOLN
INTRAVENOUS | Status: DC | PRN
  Administered 2022-04-22: 145 mL

## 2022-04-22 MED ORDER — NITROGLYCERIN IN D5W 200-5 MCG/ML-% IV SOLN
5.0000 ug/min | INTRAVENOUS | Status: DC
  Administered 2022-04-22: 5 ug/min via INTRAVENOUS
  Filled 2022-04-22: qty 250

## 2022-04-22 MED ORDER — HEPARIN (PORCINE) IN NACL 1000-0.9 UT/500ML-% IV SOLN
INTRAVENOUS | Status: DC | PRN
  Administered 2022-04-22 (×2): 500 mL

## 2022-04-22 MED ORDER — LIDOCAINE HCL (PF) 1 % IJ SOLN
INTRAMUSCULAR | Status: AC
  Filled 2022-04-22: qty 30

## 2022-04-22 MED ORDER — ENOXAPARIN SODIUM 40 MG/0.4ML IJ SOSY
40.0000 mg | PREFILLED_SYRINGE | INTRAMUSCULAR | Status: DC
  Administered 2022-04-23: 40 mg via SUBCUTANEOUS
  Filled 2022-04-22: qty 0.4

## 2022-04-22 MED ORDER — SODIUM CHLORIDE 0.9 % WEIGHT BASED INFUSION
1.0000 mL/kg/h | INTRAVENOUS | Status: AC
  Administered 2022-04-22 – 2022-04-23 (×2): 1 mL/kg/h via INTRAVENOUS

## 2022-04-22 MED ORDER — ASPIRIN 81 MG PO CHEW: 81.0000 mg | CHEWABLE_TABLET | ORAL | Status: DC

## 2022-04-22 MED ORDER — METOPROLOL SUCCINATE ER 25 MG PO TB24
25.0000 mg | ORAL_TABLET | Freq: Every day | ORAL | Status: DC
  Administered 2022-04-23: 25 mg via ORAL
  Filled 2022-04-22: qty 1

## 2022-04-22 MED ORDER — HYDRALAZINE HCL 20 MG/ML IJ SOLN
10.0000 mg | INTRAMUSCULAR | Status: AC | PRN
Start: 2022-04-22 — End: 2022-04-23

## 2022-04-22 SURGICAL SUPPLY — 25 items
BALL SAPPHIRE NC24 2.50X12 (BALLOONS) ×2
BALLN SAPPHIRE 2.0X12 (BALLOONS) ×2
BALLOON SAPPHIRE 2.0X12 (BALLOONS) IMPLANT
BALLOON SAPPHIRE NC24 2.50X12 (BALLOONS) IMPLANT
CATH INFINITI 5FR MULTPACK ANG (CATHETERS) ×1 IMPLANT
CATH TELESCOPE 6F GEC (CATHETERS) ×1 IMPLANT
CATHETER LAUNCHER 6FR RCB (CATHETERS) ×1 IMPLANT
DEVICE CONTINUOUS FLUSH (MISCELLANEOUS) ×1 IMPLANT
ELECT DEFIB PAD ADLT CADENCE (PAD) ×1 IMPLANT
GLIDESHEATH SLEND SS 6F .021 (SHEATH) ×1 IMPLANT
GUIDEWIRE INQWIRE 1.5J.035X260 (WIRE) IMPLANT
INQWIRE 1.5J .035X260CM (WIRE) ×2
KIT ENCORE 26 ADVANTAGE (KITS) ×1 IMPLANT
KIT HEART LEFT (KITS) ×2 IMPLANT
PACK CARDIAC CATHETERIZATION (CUSTOM PROCEDURE TRAY) ×2 IMPLANT
SHEATH PINNACLE 5F 10CM (SHEATH) ×1 IMPLANT
SHEATH PINNACLE 6F 10CM (SHEATH) ×1 IMPLANT
SHEATH PINNACLE ST 6F 45CM (SHEATH) ×1 IMPLANT
SHEATH PROBE COVER 6X72 (BAG) ×1 IMPLANT
STENT ONYX FRONTIER 2.25X15 (Permanent Stent) ×1 IMPLANT
TRANSDUCER W/STOPCOCK (MISCELLANEOUS) ×2 IMPLANT
TUBING CIL FLEX 10 FLL-RA (TUBING) ×2 IMPLANT
WIRE ASAHI PROWATER 180CM (WIRE) ×1 IMPLANT
WIRE EMERALD 3MM-J .035X150CM (WIRE) ×1 IMPLANT
WIRE HI TORQ VERSACORE-J 145CM (WIRE) ×1 IMPLANT

## 2022-04-22 NOTE — Progress Notes (Signed)
   04/22/22 1646  Clinical Encounter Type  Visited With Patient;Health care provider  Visit Type Pre-op;Critical Care;Initial;Spiritual support  Referral From Physician (Dr. Peter M. Jordan, MD)  Consult/Referral To Chaplain ( )  Recommendations CODE STEMI  Spiritual Encounters  Spiritual Needs Emotional;Prayer   This Chaplain met [name] at patient's bedside. [name] was grateful for my visit. [name] stated that he was ready for surgery. And ready to go home. He shared that he is completely at peace with the outcome of the surgery. [name] stated that his family was in route to . Chaplain accompanied [name] to Cath-Lab Room 1. WIFE: Elain Manasco [phone] SON: Douglas Reading [phone] DAUGHTER: Beth Tibbitts [phone] DAUGHTER: Laurie Plummer [phone] 

## 2022-04-22 NOTE — Progress Notes (Signed)
Called and spoke to the son, Marden Noble, to update him about the plan for cath. Family will come to visit following the procedure.  Gwyndolyn Kaufman, MD

## 2022-04-22 NOTE — H&P (View-Only) (Signed)
Progress Note  Patient Name: Tony Lee Date of Encounter: 04/22/2022  [facility] Cardiologist: Peter Martinique, MD   Subjective   Patient developed severe substernal chest pain when ambulating to the bathroom. Given nitro x3. ECG with NSR, RBBB, no acute ischemic changes. Trop from this AM pending but was 49>53 last night.   On my arrival, chest pain had improved to 2/10. No SOB. Started on IV nitro. If recurrent pain, will plan for cath today.  Inpatient Medications    Scheduled Meds:  amLODipine  5 mg Oral Daily   aspirin EC  81 mg Oral Daily   fenofibrate  160 mg Oral Daily   insulin aspart  0-9 Units Subcutaneous TID WC   linagliptin  5 mg Oral Daily   metoprolol succinate  12.5 mg Oral Daily   pantoprazole  40 mg Oral Daily   rosuvastatin  40 mg Oral Daily   Continuous Infusions:  heparin 1,000 Units/hr (04/21/22 1139)   PRN Meds: acetaminophen, docusate sodium, nitroGLYCERIN, ondansetron (ZOFRAN) IV   Vital Signs    Vitals:   04/21/22 1257 04/21/22 1625 04/22/22 0017 04/22/22 0451  BP: 121/60 120/72  111/71  Pulse:  64  70  Resp: '19 19  16  '$ Temp:  98 F (36.7 C)  98.4 F (36.9 C)  TempSrc:  Oral  Oral  SpO2:  96%  95%  Weight:   80.7 kg   Height:        Intake/Output Summary (Last 24 hours) at 04/22/2022 0646 Last data filed at 04/22/2022 0400 Gross per 24 hour  Intake 533.29 ml  Output 1200 ml  Net -666.71 ml      04/22/2022   12:17 AM 04/21/2022   12:00 AM 04/20/2022   10:15 AM  Last 3 Weights  Weight (lbs) 178 lb 180 lb 11.2 oz 195 lb  Weight (kg) 80.74 kg 81.965 kg 88.451 kg      Telemetry    NSR/sinus bradycardia - Personally Reviewed  ECG    NSR, RBBB - Personally Reviewed  Physical Exam   GEN: Elderly male, sitting in bed Neck: No JVD Cardiac: RRR, no murmurs, rubs, or gallops.  Respiratory: Clear to auscultation bilaterally. GI: Soft, nontender, non-distended  MS: No edema; No deformity. Neuro:  Nonfocal  Psych:  Anxious  Labs    High Sensitivity Troponin:   Recent Labs  Lab 04/20/22 1228 04/20/22 1531 04/21/22 0308 04/21/22 1001 04/21/22 1238  TROPONINIHS 19* 29* 72* 49* 53*      Chemistry Recent Labs  Lab 04/20/22 1022 04/21/22 0308  NA 136 139  K 4.5 4.1  CL 107 105  CO2 21* 26  GLUCOSE 306* 154*  BUN 30* 26*  CREATININE 1.59* 1.57*  CALCIUM 9.2 9.3  PROT  --  5.9*  ALBUMIN  --  3.1*  AST  --  13*  ALT  --  11  ALKPHOS  --  39  BILITOT  --  0.6  GFRNONAA 41* 42*  ANIONGAP 8 8     Lipids  Recent Labs  Lab 04/21/22 0308  CHOL 105  TRIG 132  HDL 37*  LDLCALC 42  CHOLHDL 2.8    Hematology Recent Labs  Lab 04/20/22 1022  WBC 8.0  RBC 4.20*  HGB 13.4  HCT 40.9  MCV 97.4  MCH 31.9  MCHC 32.8  RDW 12.2  PLT 240    Thyroid  Recent Labs  Lab 04/21/22 0308  TSH 3.233    BNPNo results  for input(s): "BNP", "PROBNP" in the last 168 hours.  DDimer No results for input(s): "DDIMER" in the last 168 hours.   Radiology    ECHOCARDIOGRAM COMPLETE  Result Date: 04/20/2022    ECHOCARDIOGRAM REPORT   Patient Name:   SHAWNMICHAEL PARENTEAU Date of Exam: 04/20/2022 Medical Rec #:  [id number]       Height:       67.0 in Accession #:    [id number]      Weight:       195.0 lb Date of Birth:  Oct 22, 1930       BSA:          2.001 m Patient Age:    88 years        BP:           180/86 mmHg Patient Gender: M               HR:           53 bpm. Exam Location:  Inpatient Procedure: 2D Echo, Cardiac Doppler and Color Doppler Indications:    Chest pain  History:        Patient has no prior history of Echocardiogram examinations.                 Previous Myocardial Infarction and CAD, Prior CABG;                 Arrythmias:Bradycardia.  Sonographer:    Merrie Roof RDCS Referring Phys: [id number] [place]  1. Left ventricular ejection fraction, by estimation, is 60 to 65%. The left ventricle has normal function. The left ventricle has no regional wall motion abnormalities. There  is mild left ventricular hypertrophy of the basal-septal segment. Left ventricular diastolic parameters are consistent with Grade I diastolic dysfunction (impaired relaxation).  2. Right ventricular systolic function is normal. The right ventricular size is normal.  3. The mitral valve is normal in structure. No evidence of mitral valve regurgitation. No evidence of mitral stenosis.  4. The aortic valve is tricuspid. Aortic valve regurgitation is trivial. Aortic valve sclerosis/calcification is present, without any evidence of aortic stenosis.  5. The inferior vena cava is normal in size with greater than 50% respiratory variability, suggesting right atrial pressure of 3 mmHg. FINDINGS  Left Ventricle: Left ventricular ejection fraction, by estimation, is 60 to 65%. The left ventricle has normal function. The left ventricle has no regional wall motion abnormalities. The left ventricular internal cavity size was normal in size. There is  mild left ventricular hypertrophy of the basal-septal segment. Left ventricular diastolic parameters are consistent with Grade I diastolic dysfunction (impaired relaxation). Normal left ventricular filling pressure. Right Ventricle: The right ventricular size is normal. No increase in right ventricular wall thickness. Right ventricular systolic function is normal. Left Atrium: Left atrial size was normal in size. Right Atrium: Right atrial size was normal in size. Pericardium: There is no evidence of pericardial effusion. Mitral Valve: The mitral valve is normal in structure. No evidence of mitral valve regurgitation. No evidence of mitral valve stenosis. Tricuspid Valve: The tricuspid valve is normal in structure. Tricuspid valve regurgitation is trivial. No evidence of tricuspid stenosis. Aortic Valve: The aortic valve is tricuspid. Aortic valve regurgitation is trivial. Aortic valve sclerosis/calcification is present, without any evidence of aortic stenosis. Pulmonic Valve: The  pulmonic valve was normal in structure. Pulmonic valve regurgitation is trivial. No evidence of pulmonic stenosis. Aorta: The aortic root is normal in size and structure.  Venous: The inferior vena cava is normal in size with greater than 50% respiratory variability, suggesting right atrial pressure of 3 mmHg. IAS/Shunts: No atrial level shunt detected by color flow Doppler.  LEFT VENTRICLE PLAX 2D LVIDd:         4.50 cm   Diastology LVIDs:         3.00 cm   LV e' medial:    6.64 cm/s LV PW:         0.90 cm   LV E/e' medial:  8.5 LV IVS:        0.90 cm   LV e' lateral:   9.14 cm/s LVOT diam:     2.10 cm   LV E/e' lateral: 6.2 LV SV:         73 LV SV Index:   37 LVOT Area:     3.46 cm  RIGHT VENTRICLE RV Basal diam:  2.90 cm RV S prime:     6.60 cm/s TAPSE (M-mode): 1.5 cm LEFT ATRIUM             Index        RIGHT ATRIUM           Index LA diam:        4.30 cm 2.15 cm/m   RA Area:     18.30 cm LA Vol (A2C):   51.7 ml 25.84 ml/m  RA Volume:   48.30 ml  24.14 ml/m LA Vol (A4C):   52.0 ml 25.99 ml/m LA Biplane Vol: 51.9 ml 25.94 ml/m  AORTIC VALVE LVOT Vmax:   94.20 cm/s LVOT Vmean:  59.100 cm/s LVOT VTI:    0.212 m  AORTA Ao Root diam: 3.50 cm Ao Asc diam:  3.10 cm MITRAL VALVE MV Area (PHT): 2.66 cm    SHUNTS MV Decel Time: 285 msec    Systemic VTI:  0.21 m MV E velocity: 56.40 cm/s  Systemic Diam: 2.10 cm MV A velocity: 70.60 cm/s MV E/A ratio:  0.80 Fransico Him MD Electronically signed by Fransico Him MD Signature Date/Time: 04/20/2022/3:00:09 PM    Final    DG Chest 2 View  Result Date: 04/20/2022 CLINICAL DATA:  Chest pain in center of chest pressure radiating to the left arm. EXAM: CHEST - 2 VIEW COMPARISON:  Chest two views 07/24/2007 FINDINGS: Status post median sternotomy and CABG. Cardiac silhouette and mediastinal contours are within normal limits. Mild calcification within the aortic arch. Unchanged calcified benign granuloma within the inferior medial anterior right lower lobe. Mild bibasilar  bronchovascular crowding. No pleural effusion or pneumothorax. Partial visualization of right shoulder arthroplasty. IMPRESSION: No active cardiopulmonary disease. Electronically Signed   By: Yvonne Kendall M.D.   On: 04/20/2022 10:53    Cardiac Studies   TTE 04/20/22: IMPRESSIONS   1. Left ventricular ejection fraction, by estimation, is 60 to 65%. The  left ventricle has normal function. The left ventricle has no regional  wall motion abnormalities. There is mild left ventricular hypertrophy of  the basal-septal segment. Left  ventricular diastolic parameters are consistent with Grade I diastolic  dysfunction (impaired relaxation).   2. Right ventricular systolic function is normal. The right ventricular  size is normal.   3. The mitral valve is normal in structure. No evidence of mitral valve  regurgitation. No evidence of mitral stenosis.   4. The aortic valve is tricuspid. Aortic valve regurgitation is trivial.  Aortic valve sclerosis/calcification is present, without any evidence of  aortic stenosis.   5. The  inferior vena cava is normal in size with greater than 50%  respiratory variability, suggesting right atrial pressure of 3 mmHg.   Patient Profile     88 y.o. male with history of CAD with NSTEMI s/p CABG 2008, DM, dyslipidemia, chronic RBBB, esophageal stricture, prostate CA, monoclonal gammopathy, depression (no dementia per 2022 neuro visit), tremor with falls, CKD stage 3b, monoclonal gammopathy who presented to the ER with chest pain found to have mildly elevated troponin for which Cardiology was consulted.  Assessment & Plan    #Chest Pain: #CAD with history of CABG: -Presented with chest and arm pain after walking found to have mildly elevated troponin with concern for underlying symptomatic CAD -Patient initially preferred to be managed medically but has had several episodes of chest pain with minimal exertion since admission and is now planned for cath on Monday -Had  another severe chest pain episode this morning. ECG stable from priors. Repeat trops pending. Pain has now resolved with nitro -Will plan for cath Monday unless we he continues to have chest pain despite nitro gtt; trop significantly elevates, or ECG changes -Continue heparin gtt -Continue ASA '81mg'$  daily -Hold amlodipine, losartan and metop for now to allow BP room for nitro gtt -Continue crestor '40mg'$  daily  #CKD stage 3b - Cr has been at baseline (previously 1.5-1.7)   #Chronic RBBB, also with first degree AVB - Holding metop for now to allow BP room for nitro   #Essential HTN - Nitro gtt as above - Holding home BP meds to allow room for nitro gtt  Discussed with nursing. Will hold NPO for now and reassess chest pain. If develops worsening symptoms despite nitro, trop is significantly rising, or ECG changes develop, will plan for more emergent cath.  INFORMED CONSENT: I have reviewed the risks, indications, and alternatives to cardiac catheterization, possible angioplasty, and stenting with the patient. Risks include but are not limited to bleeding, infection, vascular injury, stroke, myocardial infection, arrhythmia, kidney injury, radiation-related injury in the case of prolonged fluoroscopy use, emergency cardiac surgery, and death. The patient understands the risks of serious complication is 1-2 in [id number] with diagnostic cardiac cath and 1-2% or less with angioplasty/stenting.        For questions or updates, please contact [place] Please consult [email or web address] for contact info under        Signed, Freada Bergeron, MD  04/22/2022, 6:46 AM

## 2022-04-22 NOTE — Progress Notes (Signed)
Patient complained of chest pain radiating to both arms. Nitro SL given 3 times. Pain reduced from 8 to 3. Dr. Johney Frame notified. Nitro gtt started.

## 2022-04-22 NOTE — Significant Event (Signed)
Patient with rising troponin 114>747 with multiple episodes of chest pain today requiring up-titration of nitro gtt. Continues to have mild chest pain currently. ECG at bedside this afternoon with worsening TWI in the anterior leads. Given persistent symptoms with more prominent ECG changes and rising trop, plan for cath today. This was discussed with Dr. Martinique who agrees with the plan.  INFORMED CONSENT: I have reviewed the risks, indications, and alternatives to cardiac catheterization, possible angioplasty, and stenting with the patient. Risks include but are not limited to bleeding, infection, vascular injury, stroke, myocardial infection, arrhythmia, kidney injury, radiation-related injury in the case of prolonged fluoroscopy use, emergency cardiac surgery, and death. The patient understands the risks of serious complication is 1-2 in 2446 with diagnostic cardiac cath and 1-2% or less with angioplasty/stenting.    Gwyndolyn Kaufman, MD

## 2022-04-22 NOTE — Progress Notes (Signed)
ANTICOAGULATION CONSULT NOTE Pharmacy Consult for heparin  Indication: chest pain/ACS  Allergies  Allergen Reactions   Other Itching     Mepergan Fortis, (meprozine)  = itching (patient disputes this in 2023)    Patient Measurements: Height: '5\' 7"'$  (170.2 cm) Weight: 80.7 kg (178 lb) IBW/kg (Calculated) : 66.1 HEPARIN DW (KG): 82   Vital Signs: Temp: 98.4 F (36.9 C) (08/13 0451) Temp Source: Oral (08/13 0451) BP: 117/72 (08/13 1004) Pulse Rate: 75 (08/13 1004)  Labs: Recent Labs    04/20/22 1022 04/20/22 1228 04/21/22 0308 04/21/22 1001 04/21/22 1238 04/21/22 2027 04/22/22 0711 04/22/22 0801  HGB 13.4  --   --   --   --   --  14.2  --   HCT 40.9  --   --   --   --   --  41.0  --   PLT 240  --   --   --   --   --  245  --   HEPARINUNFRC  --   --   --   --   --  0.33 0.47  --   CREATININE 1.59*  --  1.57*  --   --   --   --   --   TROPONINIHS 9   < > 72* 49* 53*  --   --  34*   < > = values in this interval not displayed.     Estimated Creatinine Clearance: 32.4 mL/min (A) (by C-G formula based on SCr of 1.57 mg/dL (H)).  Assessment: 88 YO M with PMH significant for CAD/MI s/p CABG (2018), HTN, CKDIII, and HLD presenting the ED with chest pain. This AM increasing chest pain and started on nitro gtt. Plan for cath 8/14 unless chest pain shows no improvement with nitro. No known home AC PTA. On asa 81 mg daily. Pharmacy consulted to dose heparin.   Heparin level 0.47 on drip rate 1000 units/hr therapeutic. Continues to be therapeutic. Hgb 14.2 and plt 245. No s/sx bleeding noted in chart.    Goal of Therapy:  Heparin level 0.3-0.7 units/ml Monitor platelets by anticoagulation protocol: Yes   Plan:  Continue heparin at 1000 units/hr Monitor daily heparin level and CBC Continue to monitor H&H   Will follow clinical status and plan for cath   Gena Fray, PharmD PGY1 Pharmacy Resident   04/22/2022 11:20 AM

## 2022-04-22 NOTE — Progress Notes (Signed)
PROGRESS NOTE    Tony Lee  HGD:[id number] DOB: [birth year] DOA: 04/20/2022 PCP: Haywood Pao, MD   Brief Narrative:  Tony Lee is a 88 y.o. male with medical history significant of CAD/MI s/p CABG with 6 vessels in 2018, with a normal nuclear stress test 2018, HTN, IIDM, CKD stage IIIb, HLD, presented with chest pain.   Patient ate a regular breakfast, while walking outside in the community, started to feel left arm heaviness and then soon the right arm also had sharp pain.  Soon afterwards, he started to feel pressure-like chest pain associated with diaphoresis and shortness of breath.  No palpitations no lightheadedness no nauseous vomiting.  He had to sit down to let the episode pass, and chest pain did resolved in 3 to 5 minutes before EMS arrived. patient had another episode of chest pain during the trip to ED.  No nitroglycerin given.  Patient was last seen by cardiology 3 years ago and his last stress test was in 2014.   ED Course: Blood pressure elevated, no tachycardia afebrile.  EKG chronic RBBB and T wave.  Troponin negative x 1.  Chest x-ray negative for acute findings  Assessment & Plan:   Principal Problem:   Chest pain Active Problems:   Unstable angina ([place])   #1  Unstable angina chest pain consistent with his prior angina.  Patient with history of CAD and CABG x6 in 2018.  Patient admitted with pain in both arms with ambulation.  Patient was started on nitro drip today due to chest pain.  His troponin peaked at 114.  Follow levels.  Continue heparin drip and nitro drip per cardiology.  Planning for cath in a.m. or sooner.   #2.  History of essential hypertension on nitro drip holding.  #3.  Stage IIIb CKD-stable ACE inhibitor   #4 type 2 diabetes with hyperglycemia last A1c 8.0.  On Tradjenta and Farxiga prior to admission. Continue SSI.  #5 hyperlipidemia on Crestor and fenofibrate   Estimated body mass index is 27.88 kg/m as calculated from the  following:   Height as of this encounter: '5\' 7"'$  (1.702 m).   Weight as of this encounter: 80.7 kg.  DVT prophylaxis: Heparin drip Code Status: DNR  family Communication: None at bedside  disposition Plan:  Status is: Inpatient Remains inpatient appropriate because: Unstable angina on heparin drip   Consultants:  Cardiology  Procedures: None  Antimicrobials: None   Subjective:  Patient is resting in bed he was started on nitro drip this morning due to chest pain.  Even while speaking with me he is having on and off chest pain.  Morphine ordered nitro being continued Troponin 114 from 34  Objective: Vitals:   04/22/22 0900 04/22/22 0906 04/22/22 1004 04/22/22 1121  BP: 117/63 116/61 117/72 (!) 141/83  Pulse: 70 70 75 67  Resp: '20 15 20 17  '$ Temp:    98 F (36.7 C)  TempSrc:    Oral  SpO2: 98% 99% 99% 99%  Weight:      Height:        Intake/Output Summary (Last 24 hours) at 04/22/2022 1355 Last data filed at 04/22/2022 1326 Gross per 24 hour  Intake 293.29 ml  Output 400 ml  Net -106.71 ml    Filed Weights   04/20/22 1015 04/21/22 0000 04/22/22 0017  Weight: 88.5 kg 82 kg 80.7 kg    Examination: Very pleasant elderly male in some distress due to chest pain  General exam:  Appears in distress due to chest pain Respiratory system: Clear to auscultation. Respiratory effort normal. Cardiovascular system: S1 & S2 heard, RRR. No JVD, murmurs, rubs, gallops or clicks.  Trace pedal edema. Gastrointestinal system: Abdomen is nondistended, soft and nontender. No organomegaly or masses felt. Normal bowel sounds heard. Central nervous system: Alert and oriented. No focal neurological deficits. Extremities: Trace edema bilaterally Skin: No rashes, lesions or ulcers Psychiatry: Judgement and insight appear normal. Mood & affect appropriate.     Data Reviewed: I have personally reviewed following labs and imaging studies  CBC: Recent Labs  Lab 04/20/22 1022 04/22/22 0711   WBC 8.0 10.0  HGB 13.4 14.2  HCT 40.9 41.0  MCV 97.4 94.0  PLT 240 710    Basic Metabolic Panel: Recent Labs  Lab 04/20/22 1022 04/21/22 0308 04/22/22 1022  NA 136 139 135  K 4.5 4.1 4.4  CL 107 105 105  CO2 21* 26 23  GLUCOSE 306* 154* 173*  BUN 30* 26* 26*  CREATININE 1.59* 1.57* 1.59*  CALCIUM 9.2 9.3 9.1    GFR: Estimated Creatinine Clearance: 32 mL/min (A) (by C-G formula based on SCr of 1.59 mg/dL (H)). Liver Function Tests: Recent Labs  Lab 04/21/22 0308  AST 13*  ALT 11  ALKPHOS 39  BILITOT 0.6  PROT 5.9*  ALBUMIN 3.1*    No results for input(s): "LIPASE", "AMYLASE" in the last 168 hours. No results for input(s): "AMMONIA" in the last 168 hours. Coagulation Profile: No results for input(s): "INR", "PROTIME" in the last 168 hours. Cardiac Enzymes: No results for input(s): "CKTOTAL", "CKMB", "CKMBINDEX", "TROPONINI" in the last 168 hours. BNP (last 3 results) No results for input(s): "PROBNP" in the last 8760 hours. HbA1C: Recent Labs    04/20/22 1531  HGBA1C 8.0*    CBG: Recent Labs  Lab 04/21/22 1150 04/21/22 1624 04/21/22 2055 04/22/22 0615 04/22/22 1121  GLUCAP 226* 170* 166* 158* 160*    Lipid Profile: Recent Labs    04/21/22 0308  CHOL 105  HDL 37*  LDLCALC 42  TRIG 132  CHOLHDL 2.8    Thyroid Function Tests: Recent Labs    04/21/22 0308  TSH 3.233    Anemia Panel: No results for input(s): "VITAMINB12", "FOLATE", "FERRITIN", "TIBC", "IRON", "RETICCTPCT" in the last 72 hours. Sepsis Labs: No results for input(s): "PROCALCITON", "LATICACIDVEN" in the last 168 hours.  No results found for this or any previous visit (from the past 240 hour(s)).       Radiology Studies: ECHOCARDIOGRAM COMPLETE  Result Date: 04/20/2022    ECHOCARDIOGRAM REPORT   Patient Name:   Tony Lee Date of Exam: 04/20/2022 Medical Rec #:  [id number]       Height:       67.0 in Accession #:    [id number]      Weight:       195.0 lb Date of  Birth:  1931-03-02       BSA:          2.001 m Patient Age:    41 years        BP:           180/86 mmHg Patient Gender: M               HR:           53 bpm. Exam Location:  Inpatient Procedure: 2D Echo, Cardiac Doppler and Color Doppler Indications:    Chest pain  History:  Patient has no prior history of Echocardiogram examinations.                 Previous Myocardial Infarction and CAD, Prior CABG;                 Arrythmias:Bradycardia.  Sonographer:    Merrie Roof RDCS Referring Phys: [id number] [place]  1. Left ventricular ejection fraction, by estimation, is 60 to 65%. The left ventricle has normal function. The left ventricle has no regional wall motion abnormalities. There is mild left ventricular hypertrophy of the basal-septal segment. Left ventricular diastolic parameters are consistent with Grade I diastolic dysfunction (impaired relaxation).  2. Right ventricular systolic function is normal. The right ventricular size is normal.  3. The mitral valve is normal in structure. No evidence of mitral valve regurgitation. No evidence of mitral stenosis.  4. The aortic valve is tricuspid. Aortic valve regurgitation is trivial. Aortic valve sclerosis/calcification is present, without any evidence of aortic stenosis.  5. The inferior vena cava is normal in size with greater than 50% respiratory variability, suggesting right atrial pressure of 3 mmHg. FINDINGS  Left Ventricle: Left ventricular ejection fraction, by estimation, is 60 to 65%. The left ventricle has normal function. The left ventricle has no regional wall motion abnormalities. The left ventricular internal cavity size was normal in size. There is  mild left ventricular hypertrophy of the basal-septal segment. Left ventricular diastolic parameters are consistent with Grade I diastolic dysfunction (impaired relaxation). Normal left ventricular filling pressure. Right Ventricle: The right ventricular size is normal. No increase in  right ventricular wall thickness. Right ventricular systolic function is normal. Left Atrium: Left atrial size was normal in size. Right Atrium: Right atrial size was normal in size. Pericardium: There is no evidence of pericardial effusion. Mitral Valve: The mitral valve is normal in structure. No evidence of mitral valve regurgitation. No evidence of mitral valve stenosis. Tricuspid Valve: The tricuspid valve is normal in structure. Tricuspid valve regurgitation is trivial. No evidence of tricuspid stenosis. Aortic Valve: The aortic valve is tricuspid. Aortic valve regurgitation is trivial. Aortic valve sclerosis/calcification is present, without any evidence of aortic stenosis. Pulmonic Valve: The pulmonic valve was normal in structure. Pulmonic valve regurgitation is trivial. No evidence of pulmonic stenosis. Aorta: The aortic root is normal in size and structure. Venous: The inferior vena cava is normal in size with greater than 50% respiratory variability, suggesting right atrial pressure of 3 mmHg. IAS/Shunts: No atrial level shunt detected by color flow Doppler.  LEFT VENTRICLE PLAX 2D LVIDd:         4.50 cm   Diastology LVIDs:         3.00 cm   LV e' medial:    6.64 cm/s LV PW:         0.90 cm   LV E/e' medial:  8.5 LV IVS:        0.90 cm   LV e' lateral:   9.14 cm/s LVOT diam:     2.10 cm   LV E/e' lateral: 6.2 LV SV:         73 LV SV Index:   37 LVOT Area:     3.46 cm  RIGHT VENTRICLE RV Basal diam:  2.90 cm RV S prime:     6.60 cm/s TAPSE (M-mode): 1.5 cm LEFT ATRIUM             Index        RIGHT ATRIUM  Index LA diam:        4.30 cm 2.15 cm/m   RA Area:     18.30 cm LA Vol (A2C):   51.7 ml 25.84 ml/m  RA Volume:   48.30 ml  24.14 ml/m LA Vol (A4C):   52.0 ml 25.99 ml/m LA Biplane Vol: 51.9 ml 25.94 ml/m  AORTIC VALVE LVOT Vmax:   94.20 cm/s LVOT Vmean:  59.100 cm/s LVOT VTI:    0.212 m  AORTA Ao Root diam: 3.50 cm Ao Asc diam:  3.10 cm MITRAL VALVE MV Area (PHT): 2.66 cm    SHUNTS MV  Decel Time: 285 msec    Systemic VTI:  0.21 m MV E velocity: 56.40 cm/s  Systemic Diam: 2.10 cm MV A velocity: 70.60 cm/s MV E/A ratio:  0.80 Fransico Him MD Electronically signed by Fransico Him MD Signature Date/Time: 04/20/2022/3:00:09 PM    Final         Scheduled Meds:  aspirin EC  81 mg Oral Daily   fenofibrate  160 mg Oral Daily   insulin aspart  0-9 Units Subcutaneous TID WC   linagliptin  5 mg Oral Daily   morphine (PF)       pantoprazole  40 mg Oral Daily   rosuvastatin  40 mg Oral Daily   Continuous Infusions:  heparin 1,000 Units/hr (04/22/22 0753)   nitroGLYCERIN 10 mcg/min (04/22/22 0904)     LOS: 1 day    Time spent: 35 min  Georgette Shell, MD  04/22/2022, 1:55 PM

## 2022-04-22 NOTE — Progress Notes (Signed)
Critical troponin lab value of 114. Paged and notified Pemberton MD. Patient has 2/10 chest pain and no signs of distress. VS stable. Plan of care continue.

## 2022-04-22 NOTE — Progress Notes (Signed)
1400 Critical troponin lab value of 747. Patient is having 1/10 chest pain. Patient shows no signs of distress. Paged Johney Frame MD to notify.

## 2022-04-22 NOTE — Interval H&P Note (Signed)
History and Physical Interval Note:  04/22/2022 5:23 PM  Tony Lee  has presented today for surgery, with the diagnosis of STEMI.  The various methods of treatment have been discussed with the patient and family. After consideration of risks, benefits and other options for treatment, the patient has consented to  Procedure(s): Coronary/Graft Acute MI Revascularization (N/A) LEFT HEART CATH AND CORONARY ANGIOGRAPHY (N/A) as a surgical intervention.  The patient's history has been reviewed, patient examined, no change in status, stable for surgery.  I have reviewed the patient's chart and labs.  Questions were answered to the patient's satisfaction.    Cath Lab Visit (complete for each Cath Lab visit)  Clinical Evaluation Leading to the Procedure:   ACS: Yes.    Non-ACS:    Anginal Classification: CCS IV  Anti-ischemic medical therapy: Maximal Therapy (2 or more classes of medications)  Non-Invasive Test Results: No non-invasive testing performed  Prior CABG: Previous CABG       Tony Lee [facility] 04/22/2022 5:24 PM

## 2022-04-22 NOTE — Progress Notes (Addendum)
Progress Note  Patient Name: Tony Lee Date of Encounter: 04/22/2022  [facility] Cardiologist: Peter Martinique, MD   Subjective   Patient developed severe substernal chest pain when ambulating to the bathroom. Given nitro x3. ECG with NSR, RBBB, no acute ischemic changes. Trop from this AM pending but was 49>53 last night.   On my arrival, chest pain had improved to 2/10. No SOB. Started on IV nitro. If recurrent pain, will plan for cath today.  Inpatient Medications    Scheduled Meds:  amLODipine  5 mg Oral Daily   aspirin EC  81 mg Oral Daily   fenofibrate  160 mg Oral Daily   insulin aspart  0-9 Units Subcutaneous TID WC   linagliptin  5 mg Oral Daily   metoprolol succinate  12.5 mg Oral Daily   pantoprazole  40 mg Oral Daily   rosuvastatin  40 mg Oral Daily   Continuous Infusions:  heparin 1,000 Units/hr (04/21/22 1139)   PRN Meds: acetaminophen, docusate sodium, nitroGLYCERIN, ondansetron (ZOFRAN) IV   Vital Signs    Vitals:   04/21/22 1257 04/21/22 1625 04/22/22 0017 04/22/22 0451  BP: 121/60 120/72  111/71  Pulse:  64  70  Resp: '19 19  16  '$ Temp:  98 F (36.7 C)  98.4 F (36.9 C)  TempSrc:  Oral  Oral  SpO2:  96%  95%  Weight:   80.7 kg   Height:        Intake/Output Summary (Last 24 hours) at 04/22/2022 0646 Last data filed at 04/22/2022 0400 Gross per 24 hour  Intake 533.29 ml  Output 1200 ml  Net -666.71 ml      04/22/2022   12:17 AM 04/21/2022   12:00 AM 04/20/2022   10:15 AM  Last 3 Weights  Weight (lbs) 178 lb 180 lb 11.2 oz 195 lb  Weight (kg) 80.74 kg 81.965 kg 88.451 kg      Telemetry    NSR/sinus bradycardia - Personally Reviewed  ECG    NSR, RBBB - Personally Reviewed  Physical Exam   GEN: Elderly male, sitting in bed Neck: No JVD Cardiac: RRR, no murmurs, rubs, or gallops.  Respiratory: Clear to auscultation bilaterally. GI: Soft, nontender, non-distended  MS: No edema; No deformity. Neuro:  Nonfocal  Psych:  Anxious  Labs    High Sensitivity Troponin:   Recent Labs  Lab 04/20/22 1228 04/20/22 1531 04/21/22 0308 04/21/22 1001 04/21/22 1238  TROPONINIHS 19* 29* 72* 49* 53*      Chemistry Recent Labs  Lab 04/20/22 1022 04/21/22 0308  NA 136 139  K 4.5 4.1  CL 107 105  CO2 21* 26  GLUCOSE 306* 154*  BUN 30* 26*  CREATININE 1.59* 1.57*  CALCIUM 9.2 9.3  PROT  --  5.9*  ALBUMIN  --  3.1*  AST  --  13*  ALT  --  11  ALKPHOS  --  39  BILITOT  --  0.6  GFRNONAA 41* 42*  ANIONGAP 8 8     Lipids  Recent Labs  Lab 04/21/22 0308  CHOL 105  TRIG 132  HDL 37*  LDLCALC 42  CHOLHDL 2.8    Hematology Recent Labs  Lab 04/20/22 1022  WBC 8.0  RBC 4.20*  HGB 13.4  HCT 40.9  MCV 97.4  MCH 31.9  MCHC 32.8  RDW 12.2  PLT 240    Thyroid  Recent Labs  Lab 04/21/22 0308  TSH 3.233    BNPNo results  for input(s): "BNP", "PROBNP" in the last 168 hours.  DDimer No results for input(s): "DDIMER" in the last 168 hours.   Radiology    ECHOCARDIOGRAM COMPLETE  Result Date: 04/20/2022    ECHOCARDIOGRAM REPORT   Patient Name:   Tony Lee Date of Exam: 04/20/2022 Medical Rec #:  [id number]       Height:       67.0 in Accession #:    [id number]      Weight:       195.0 lb Date of Birth:  [birth year]       BSA:          2.001 m Patient Age:    88 years        BP:           180/86 mmHg Patient Gender: M               HR:           53 bpm. Exam Location:  Inpatient Procedure: 2D Echo, Cardiac Doppler and Color Doppler Indications:    Chest pain  History:        Patient has no prior history of Echocardiogram examinations.                 Previous Myocardial Infarction and CAD, Prior CABG;                 Arrythmias:Bradycardia.  Sonographer:    Merrie Roof RDCS Referring Phys: [id number] [place]  1. Left ventricular ejection fraction, by estimation, is 60 to 65%. The left ventricle has normal function. The left ventricle has no regional wall motion abnormalities. There  is mild left ventricular hypertrophy of the basal-septal segment. Left ventricular diastolic parameters are consistent with Grade I diastolic dysfunction (impaired relaxation).  2. Right ventricular systolic function is normal. The right ventricular size is normal.  3. The mitral valve is normal in structure. No evidence of mitral valve regurgitation. No evidence of mitral stenosis.  4. The aortic valve is tricuspid. Aortic valve regurgitation is trivial. Aortic valve sclerosis/calcification is present, without any evidence of aortic stenosis.  5. The inferior vena cava is normal in size with greater than 50% respiratory variability, suggesting right atrial pressure of 3 mmHg. FINDINGS  Left Ventricle: Left ventricular ejection fraction, by estimation, is 60 to 65%. The left ventricle has normal function. The left ventricle has no regional wall motion abnormalities. The left ventricular internal cavity size was normal in size. There is  mild left ventricular hypertrophy of the basal-septal segment. Left ventricular diastolic parameters are consistent with Grade I diastolic dysfunction (impaired relaxation). Normal left ventricular filling pressure. Right Ventricle: The right ventricular size is normal. No increase in right ventricular wall thickness. Right ventricular systolic function is normal. Left Atrium: Left atrial size was normal in size. Right Atrium: Right atrial size was normal in size. Pericardium: There is no evidence of pericardial effusion. Mitral Valve: The mitral valve is normal in structure. No evidence of mitral valve regurgitation. No evidence of mitral valve stenosis. Tricuspid Valve: The tricuspid valve is normal in structure. Tricuspid valve regurgitation is trivial. No evidence of tricuspid stenosis. Aortic Valve: The aortic valve is tricuspid. Aortic valve regurgitation is trivial. Aortic valve sclerosis/calcification is present, without any evidence of aortic stenosis. Pulmonic Valve: The  pulmonic valve was normal in structure. Pulmonic valve regurgitation is trivial. No evidence of pulmonic stenosis. Aorta: The aortic root is normal in size and structure.  Venous: The inferior vena cava is normal in size with greater than 50% respiratory variability, suggesting right atrial pressure of 3 mmHg. IAS/Shunts: No atrial level shunt detected by color flow Doppler.  LEFT VENTRICLE PLAX 2D LVIDd:         4.50 cm   Diastology LVIDs:         3.00 cm   LV e' medial:    6.64 cm/s LV PW:         0.90 cm   LV E/e' medial:  8.5 LV IVS:        0.90 cm   LV e' lateral:   9.14 cm/s LVOT diam:     2.10 cm   LV E/e' lateral: 6.2 LV SV:         73 LV SV Index:   37 LVOT Area:     3.46 cm  RIGHT VENTRICLE RV Basal diam:  2.90 cm RV S prime:     6.60 cm/s TAPSE (M-mode): 1.5 cm LEFT ATRIUM             Index        RIGHT ATRIUM           Index LA diam:        4.30 cm 2.15 cm/m   RA Area:     18.30 cm LA Vol (A2C):   51.7 ml 25.84 ml/m  RA Volume:   48.30 ml  24.14 ml/m LA Vol (A4C):   52.0 ml 25.99 ml/m LA Biplane Vol: 51.9 ml 25.94 ml/m  AORTIC VALVE LVOT Vmax:   94.20 cm/s LVOT Vmean:  59.100 cm/s LVOT VTI:    0.212 m  AORTA Ao Root diam: 3.50 cm Ao Asc diam:  3.10 cm MITRAL VALVE MV Area (PHT): 2.66 cm    SHUNTS MV Decel Time: 285 msec    Systemic VTI:  0.21 m MV E velocity: 56.40 cm/s  Systemic Diam: 2.10 cm MV A velocity: 70.60 cm/s MV E/A ratio:  0.80 Fransico Him MD Electronically signed by Fransico Him MD Signature Date/Time: 04/20/2022/3:00:09 PM    Final    DG Chest 2 View  Result Date: 04/20/2022 CLINICAL DATA:  Chest pain in center of chest pressure radiating to the left arm. EXAM: CHEST - 2 VIEW COMPARISON:  Chest two views 07/24/2007 FINDINGS: Status post median sternotomy and CABG. Cardiac silhouette and mediastinal contours are within normal limits. Mild calcification within the aortic arch. Unchanged calcified benign granuloma within the inferior medial anterior right lower lobe. Mild bibasilar  bronchovascular crowding. No pleural effusion or pneumothorax. Partial visualization of right shoulder arthroplasty. IMPRESSION: No active cardiopulmonary disease. Electronically Signed   By: Yvonne Kendall M.D.   On: 04/20/2022 10:53    Cardiac Studies   TTE 04/20/22: IMPRESSIONS   1. Left ventricular ejection fraction, by estimation, is 60 to 65%. The  left ventricle has normal function. The left ventricle has no regional  wall motion abnormalities. There is mild left ventricular hypertrophy of  the basal-septal segment. Left  ventricular diastolic parameters are consistent with Grade I diastolic  dysfunction (impaired relaxation).   2. Right ventricular systolic function is normal. The right ventricular  size is normal.   3. The mitral valve is normal in structure. No evidence of mitral valve  regurgitation. No evidence of mitral stenosis.   4. The aortic valve is tricuspid. Aortic valve regurgitation is trivial.  Aortic valve sclerosis/calcification is present, without any evidence of  aortic stenosis.   5. The  inferior vena cava is normal in size with greater than 50%  respiratory variability, suggesting right atrial pressure of 3 mmHg.   Patient Profile     88 y.o. male with history of CAD with NSTEMI s/p CABG 2008, DM, dyslipidemia, chronic RBBB, esophageal stricture, prostate CA, monoclonal gammopathy, depression (no dementia per 2022 neuro visit), tremor with falls, CKD stage 3b, monoclonal gammopathy who presented to the ER with chest pain found to have mildly elevated troponin for which Cardiology was consulted.  Assessment & Plan    #Chest Pain: #CAD with history of CABG: -Presented with chest and arm pain after walking found to have mildly elevated troponin with concern for underlying symptomatic CAD -Patient initially preferred to be managed medically but has had several episodes of chest pain with minimal exertion since admission and is now planned for cath on Monday -Had  another severe chest pain episode this morning. ECG stable from priors. Repeat trops pending. Pain has now resolved with nitro -Will plan for cath Monday unless we he continues to have chest pain despite nitro gtt; trop significantly elevates, or ECG changes -Continue heparin gtt -Continue ASA '81mg'$  daily -Hold amlodipine, losartan and metop for now to allow BP room for nitro gtt -Continue crestor '40mg'$  daily  #CKD stage 3b - Cr has been at baseline (previously 1.5-1.7)   #Chronic RBBB, also with first degree AVB - Holding metop for now to allow BP room for nitro   #Essential HTN - Nitro gtt as above - Holding home BP meds to allow room for nitro gtt  Discussed with nursing. Will hold NPO for now and reassess chest pain. If develops worsening symptoms despite nitro, trop is significantly rising, or ECG changes develop, will plan for more emergent cath.  INFORMED CONSENT: I have reviewed the risks, indications, and alternatives to cardiac catheterization, possible angioplasty, and stenting with the patient. Risks include but are not limited to bleeding, infection, vascular injury, stroke, myocardial infection, arrhythmia, kidney injury, radiation-related injury in the case of prolonged fluoroscopy use, emergency cardiac surgery, and death. The patient understands the risks of serious complication is 1-2 in 3428 with diagnostic cardiac cath and 1-2% or less with angioplasty/stenting.        For questions or updates, please contact [place] Please consult [email or web address] for contact info under        Signed, Freada Bergeron, MD  04/22/2022, 6:46 AM

## 2022-04-23 ENCOUNTER — Encounter (HOSPITAL_COMMUNITY): Payer: Self-pay | Admitting: Cardiology

## 2022-04-23 DIAGNOSIS — R072 Precordial pain: Secondary | ICD-10-CM | POA: Diagnosis not present

## 2022-04-23 DIAGNOSIS — I200 Unstable angina: Secondary | ICD-10-CM | POA: Diagnosis not present

## 2022-04-23 DIAGNOSIS — I214 Non-ST elevation (NSTEMI) myocardial infarction: Secondary | ICD-10-CM | POA: Diagnosis not present

## 2022-04-23 LAB — CBC
HCT: 39.1 % (ref 39.0–52.0)
Hemoglobin: 13.5 g/dL (ref 13.0–17.0)
MCH: 32.7 pg (ref 26.0–34.0)
MCHC: 34.5 g/dL (ref 30.0–36.0)
MCV: 94.7 fL (ref 80.0–100.0)
Platelets: 208 10*3/uL (ref 150–400)
RBC: 4.13 MIL/uL — ABNORMAL LOW (ref 4.22–5.81)
RDW: 12.3 % (ref 11.5–15.5)
WBC: 7.1 10*3/uL (ref 4.0–10.5)
nRBC: 0 % (ref 0.0–0.2)

## 2022-04-23 LAB — POCT I-STAT 7, (LYTES, BLD GAS, ICA,H+H)
Acid-base deficit: 2 mmol/L (ref 0.0–2.0)
Bicarbonate: 22 mmol/L (ref 20.0–28.0)
Calcium, Ion: 1.2 mmol/L (ref 1.15–1.40)
HCT: 38 % — ABNORMAL LOW (ref 39.0–52.0)
Hemoglobin: 12.9 g/dL — ABNORMAL LOW (ref 13.0–17.0)
O2 Saturation: 95 %
Potassium: 3.9 mmol/L (ref 3.5–5.1)
Sodium: 138 mmol/L (ref 135–145)
TCO2: 23 mmol/L (ref 22–32)
pCO2 arterial: 33.2 mmHg (ref 32–48)
pH, Arterial: 7.429 (ref 7.35–7.45)
pO2, Arterial: 71 mmHg — ABNORMAL LOW (ref 83–108)

## 2022-04-23 LAB — BASIC METABOLIC PANEL
Anion gap: 8 (ref 5–15)
BUN: 26 mg/dL — ABNORMAL HIGH (ref 8–23)
CO2: 23 mmol/L (ref 22–32)
Calcium: 9 mg/dL (ref 8.9–10.3)
Chloride: 105 mmol/L (ref 98–111)
Creatinine, Ser: 1.81 mg/dL — ABNORMAL HIGH (ref 0.61–1.24)
GFR, Estimated: 35 mL/min — ABNORMAL LOW (ref >60)
Glucose, Bld: 197 mg/dL — ABNORMAL HIGH (ref 70–99)
Potassium: 4.1 mmol/L (ref 3.5–5.1)
Sodium: 136 mmol/L (ref 135–145)

## 2022-04-23 LAB — GLUCOSE, CAPILLARY
Glucose-Capillary: 194 mg/dL — ABNORMAL HIGH (ref 70–99)
Glucose-Capillary: 217 mg/dL — ABNORMAL HIGH (ref 70–99)
Glucose-Capillary: 227 mg/dL — ABNORMAL HIGH (ref 70–99)

## 2022-04-23 LAB — POCT ACTIVATED CLOTTING TIME
Activated Clotting Time: 293 seconds
Activated Clotting Time: 251 seconds
Activated Clotting Time: 149 seconds

## 2022-04-23 SURGERY — LEFT HEART CATH AND CORONARY ANGIOGRAPHY
Anesthesia: LOCAL

## 2022-04-23 MED ORDER — ASPIRIN 81 MG PO TBEC: 81.0000 mg | DELAYED_RELEASE_TABLET | Freq: Every day | ORAL | 12 refills | Status: AC

## 2022-04-23 MED ORDER — FENOFIBRATE 160 MG PO TABS: 160.0000 mg | ORAL_TABLET | Freq: Every day | ORAL | 0 refills | Status: DC

## 2022-04-23 MED ORDER — ATROPINE SULFATE 1 MG/10ML IJ SOSY
PREFILLED_SYRINGE | INTRAMUSCULAR | Status: AC
  Filled 2022-04-23: qty 10

## 2022-04-23 MED ORDER — LOSARTAN POTASSIUM 25 MG PO TABS: 25.0000 mg | ORAL_TABLET | Freq: Every day | ORAL | 0 refills | Status: DC

## 2022-04-23 MED ORDER — TICAGRELOR 90 MG PO TABS: 90.0000 mg | ORAL_TABLET | Freq: Two times a day (BID) | ORAL | 0 refills | Status: AC

## 2022-04-23 MED ORDER — DEXMEDETOMIDINE HCL IN NACL 400 MCG/100ML IV SOLN: 0.1000 ug/kg/h | INTRAVENOUS | Status: DC

## 2022-04-23 MED ORDER — LORAZEPAM 2 MG/ML IJ SOLN
INTRAMUSCULAR | Status: AC
  Filled 2022-04-23: qty 1

## 2022-04-23 MED ORDER — LORAZEPAM 2 MG/ML IJ SOLN
1.0000 mg | INTRAMUSCULAR | Status: DC | PRN
Start: 2022-04-23 — End: 2022-04-23
  Administered 2022-04-23 (×2): 1 mg via INTRAVENOUS
  Filled 2022-04-23: qty 1

## 2022-04-23 NOTE — Progress Notes (Deleted)
Patient having short runs of afib with activity. Dr. Martinique made aware.

## 2022-04-23 NOTE — Progress Notes (Addendum)
PROGRESS NOTE    Tony Lee  QVZ:[id number] DOB: 05-07-1931 DOA: 04/20/2022 PCP: Haywood Pao, MD   Brief Narrative:  Tony Lee is a 88 y.o. male with medical history significant of CAD/MI s/p CABG with 6 vessels in 2018, with a normal nuclear stress test 2018, HTN, IIDM, CKD stage IIIb, HLD, presented with chest pain.  8/13- underwent LHC, s/p PCI of PL branch    8/13 LHC - abbrev 3 vessel obstructive CAD, there is a 70% stenosis in the mid LAD which was not grafted Patent LIMA to a large diagonal branch. There is 70% stenosis in the diagonal distal to the graft. Patent SVG to distal RCA. There is a severe stenosis in the PL branch distal to the graft. This appears to be the culprit lesion. Successful PCI of the PL branch of the RCA through the vein graft with DES x 1  Assessment & Plan:   Principal Problem:   Chest pain Active Problems:   Unstable angina (HCC)   Non-ST elevation (NSTEMI) myocardial infarction [facility])   #1  Unstable angina chest pain consistent with his prior angina.  Patient with history of CAD and CABG x6 in 2018.  Patient admitted with pain in both arms with ambulation.  Patient was started on nitro drip today due to chest pain.  His troponin peaked at 114.  Follow levels.  Continue heparin drip and nitro drip per cardiology.  Planning for cath in a.m. or sooner.  Undwent PCI to PL branch and recommended medical management of LAD - awaiting PT eval and resolution of delirium/adv dementia Likely home in 24H - avoid benzo Can trial precedex for agitation but would prefer reorientation, verbal cues and family members to assist in improving symptoms  #2.  History of essential hypertension on nitro drip holding.  #3.  Stage IIIb CKD-stable ACE inhibitor   #4 type 2 diabetes with hyperglycemia last A1c 8.0.  On Tradjenta and Farxiga prior to admission. Continue SSI.  #5 hyperlipidemia on Crestor and fenofibrate   Estimated body mass index is  27.88 kg/m as calculated from the following:   Height as of this encounter: '5\' 7"'$  (1.702 m).   Weight as of this encounter: 80.7 kg.  DVT prophylaxis: Heparin drip Code Status: DNR  family Communication: None at bedside  disposition Plan:  Status is: Inpatient Remains inpatient appropriate because: Unstable angina on heparin drip   Consultants:  Cardiology  Procedures: None  Antimicrobials: None   Subjective:  Patient is resting in bed he was started on nitro drip this morning due to chest pain.  Even while speaking with me he is having on and off chest pain.  Morphine ordered nitro being continued Troponin 114 from 34  Objective: Vitals:   04/23/22 0740 04/23/22 0800 04/23/22 0900 04/23/22 1107  BP:   (!) 150/122   Pulse:  96 94   Resp:  (!) 21 (!) 26   Temp: 97.7 F (36.5 C)   98.2 F (36.8 C)  TempSrc: Oral   Oral  SpO2:  96% 93%   Weight:      Height:        Intake/Output Summary (Last 24 hours) at 04/23/2022 1152 Last data filed at 04/23/2022 0400 Gross per 24 hour  Intake 1278.97 ml  Output 1200 ml  Net 78.97 ml    Filed Weights   04/20/22 1015 04/21/22 0000 04/22/22 0017  Weight: 88.5 kg 82 kg 80.7 kg    Examination: Very pleasant elderly  male in some distress due to chest pain  General exam: Appears in distress due to chest pain Respiratory system: Clear to auscultation. Respiratory effort normal. Cardiovascular system: S1 & S2 heard, RRR. No JVD, murmurs, rubs, gallops or clicks.  Trace pedal edema. Gastrointestinal system: Abdomen is nondistended, soft and nontender. No organomegaly or masses felt. Normal bowel sounds heard. Central nervous system: Alert and oriented. No focal neurological deficits. Extremities: Trace edema bilaterally Skin: No rashes, lesions or ulcers Psychiatry: Judgement and insight appear normal. Mood & affect appropriate.     Data Reviewed: I have personally reviewed following labs and imaging studies  CBC: Recent Labs   Lab 04/20/22 1022 04/22/22 0711 04/22/22 1839 04/22/22 2043 04/22/22 2224 04/23/22 1025  WBC 8.0 10.0  --   --  9.8 7.1  HGB 13.4 14.2 12.9* 12.9* 13.1 13.5  HCT 40.9 41.0 38.0* 38.0* 37.7* 39.1  MCV 97.4 94.0  --   --  93.5 94.7  PLT 240 245  --   --  238 956    Basic Metabolic Panel: Recent Labs  Lab 04/20/22 1022 04/21/22 0308 04/22/22 1022 04/22/22 1839 04/22/22 2042 04/22/22 2043 04/23/22 1025  NA 136 139 135 138 134* 137 136  K 4.5 4.1 4.4 3.9 4.0 4.1 4.1  CL 107 105 105  --  107 103 105  CO2 21* 26 23  --  19*  --  23  GLUCOSE 306* 154* 173*  --  192* 198* 197*  BUN 30* 26* 26*  --  25* 25* 26*  CREATININE 1.59* 1.57* 1.59*  --  1.67* 1.80* 1.81*  CALCIUM 9.2 9.3 9.1  --  8.9  --  9.0  MG  --   --   --   --  1.8  --   --     GFR: Estimated Creatinine Clearance: 28.1 mL/min (A) (by C-G formula based on SCr of 1.81 mg/dL (H)). Liver Function Tests: Recent Labs  Lab 04/21/22 0308  AST 13*  ALT 11  ALKPHOS 39  BILITOT 0.6  PROT 5.9*  ALBUMIN 3.1*    No results for input(s): "LIPASE", "AMYLASE" in the last 168 hours. No results for input(s): "AMMONIA" in the last 168 hours. Coagulation Profile: No results for input(s): "INR", "PROTIME" in the last 168 hours. Cardiac Enzymes: No results for input(s): "CKTOTAL", "CKMB", "CKMBINDEX", "TROPONINI" in the last 168 hours. BNP (last 3 results) No results for input(s): "PROBNP" in the last 8760 hours. HbA1C: Recent Labs    04/20/22 1531  HGBA1C 8.0*    CBG: Recent Labs  Lab 04/22/22 1121 04/22/22 1610 04/22/22 2127 04/23/22 0620 04/23/22 1111  GLUCAP 160* 174* 192* 194* 217*    Lipid Profile: Recent Labs    04/21/22 0308  CHOL 105  HDL 37*  LDLCALC 42  TRIG 132  CHOLHDL 2.8    Thyroid Function Tests: Recent Labs    04/21/22 0308  TSH 3.233    Anemia Panel: No results for input(s): "VITAMINB12", "FOLATE", "FERRITIN", "TIBC", "IRON", "RETICCTPCT" in the last 72 hours. Sepsis  Labs: No results for input(s): "PROCALCITON", "LATICACIDVEN" in the last 168 hours.  Recent Results (from the past 240 hour(s))  MRSA Next Gen by PCR, Nasal     Status: None   Collection Time: 04/22/22  8:52 PM   Specimen: Nasal Mucosa; Nasal Swab  Result Value Ref Range Status   MRSA by PCR Next Gen NOT DETECTED NOT DETECTED Final    Comment: (NOTE) The GeneXpert MRSA Assay (FDA  approved for NASAL specimens only), is one component of a comprehensive MRSA colonization surveillance program. It is not intended to diagnose MRSA infection nor to guide or monitor treatment for MRSA infections. Test performance is not FDA approved in patients less than 10 years old. Performed at [facility], [place] [address], [place], [place] [address]          Radiology Studies: CARDIAC CATHETERIZATION  Result Date: 04/22/2022   Prox LAD to Mid LAD lesion is 70% stenosed.   1st Diag-1 lesion is 100% stenosed.   1st Diag-2 lesion is 70% stenosed.   Prox RCA lesion is 95% stenosed.   Mid RCA lesion is 100% stenosed.   Prox Cx lesion is 40% stenosed.   1st Mrg lesion is 100% stenosed.   3rd Mrg lesion is 100% stenosed.   RPAV lesion is 95% stenosed.   A drug-eluting stent was successfully placed using a [place] 2.25X15.   Post intervention, there is a 0% residual stenosis.   LIMA graft was visualized by angiography and is normal in caliber.   SVG graft was visualized by angiography and is normal in caliber.   SVG graft was visualized by angiography and is normal in caliber.   The graft exhibits no disease.   The graft exhibits no disease.   LV end diastolic pressure is mildly elevated. 3 vessel obstructive CAD, there is a 70% stenosis in the mid LAD which was not grafted Patent LIMA to a large diagonal branch. There is 70% stenosis in the diagonal distal to the graft. Patent SVG to OM1 and 2 Patent SVG to distal RCA. There is a severe stenosis in the PL branch distal to the graft. This appears to  be the culprit lesion. Mildly elevated LVEDP 18 mm Hg Successful PCI of the PL branch of the RCA through the vein graft with DES x 1 Severe tortuosity and calcification of the aortoiliac system which made the procedure challenging. Plan: DAPT with ASA and Brilinta for 12 months. Monitor renal function closely. Would treat residual disease in the LAD and diagonal medically. The LAD could be treated with PCI if he has refractory symptoms.        Scheduled Meds:  aspirin EC  81 mg Oral Daily   Chlorhexidine Gluconate Cloth  6 each Topical Daily   enoxaparin (LOVENOX) injection  40 mg Subcutaneous Q24H   fenofibrate  160 mg Oral Daily   insulin aspart  0-9 Units Subcutaneous TID WC   metoprolol succinate  25 mg Oral Daily   pantoprazole  40 mg Oral Daily   rosuvastatin  40 mg Oral Daily   sodium chloride flush  3 mL Intravenous Q12H   ticagrelor  90 mg Oral BID   Continuous Infusions:  sodium chloride     dexmedetomidine (PRECEDEX) IV infusion     nitroGLYCERIN 5 mcg/min (04/22/22 2000)     LOS: 2 days    Time spent: 52 min  Vanna Scotland, MD  04/23/2022, 11:52 AM

## 2022-04-23 NOTE — NC FL2 (Signed)
[place] LEVEL OF CARE SCREENING TOOL     IDENTIFICATION  Patient Name: Tony Lee Birthdate: [birth year] Sex: male Admission Date (Current Location): 04/20/2022  [facility] and [place] Number:  Herbalist and Address:  [facility] [place]. [facility] [place] [address], [place], [place] [address]      Provider Number: [id number]  Attending Physician Name and Address:  Vanna Scotland, MD  Relative Name and Phone Number:  Nathaniel Wakeley [phone]    Current Level of Care: Hospital Recommended Level of Care: [place] Prior Approval Number:    Date Approved/Denied:   PASRR Number:    Discharge Plan: SNF    Current Diagnoses: Patient Active Problem List   Diagnosis Date Noted   Non-ST elevation (NSTEMI) myocardial infarction [facility]    Unstable angina ([place]) 04/20/2022   Chest pain 04/20/2022   Diabetes mellitus type 2 in obese ([place]) 08/27/2014   Monoclonal paraproteinemia 03/03/2013   Unspecified hereditary and idiopathic peripheral neuropathy 01/29/2013   Ataxia 01/29/2013   Coronary artery disease    Cancer (HCC)    RBBB (right bundle branch block)    Palpitations    Dyslipidemia     Orientation RESPIRATION BLADDER Height & Weight     Self  Normal Incontinent, External catheter Weight: 178 lb (80.7 kg) Height:  '5\' 7"'$  (170.2 cm)  BEHAVIORAL SYMPTOMS/MOOD NEUROLOGICAL BOWEL NUTRITION STATUS      Continent Diet (See DC summary)  AMBULATORY STATUS COMMUNICATION OF NEEDS Skin   Limited Assist Verbally Skin abrasions (L arm)                       Personal Care Assistance Level of Assistance  Bathing, Feeding, Dressing Bathing Assistance: Limited assistance Feeding assistance: Limited assistance Dressing Assistance: Limited assistance     Functional Limitations Info  Sight, Hearing, Speech Sight Info: Impaired Hearing Info: Impaired Speech Info: Adequate    SPECIAL CARE FACTORS FREQUENCY  PT (By licensed PT), OT  (By licensed OT)     PT Frequency: 5x week OT Frequency: 5x week            Contractures Contractures Info: Not present    Additional Factors Info  Code Status, Allergies, Insulin Sliding Scale Code Status Info: DNR Allergies Info: Meprozine   Insulin Sliding Scale Info: insulin aspart (novoLOG) injection 0-9 Units 3x daily w/ meals       Current Medications (04/23/2022):  This is the current hospital active medication list Current Facility-Administered Medications  Medication Dose Route Frequency Provider Last Rate Last Admin   0.9 %  sodium chloride infusion  250 mL Intravenous PRN Martinique, Peter M, MD       acetaminophen (TYLENOL) tablet 650 mg  650 mg Oral Q4H PRN Martinique, Peter M, MD       aspirin EC tablet 81 mg  81 mg Oral Daily Martinique, Peter M, MD   81 mg at 04/23/22 1134   Chlorhexidine Gluconate Cloth 2 % PADS 6 each  6 each Topical Daily Georgette Shell, MD   6 each at 04/23/22 1030   dexmedetomidine (PRECEDEX) 400 MCG/100ML (4 mcg/mL) infusion  0.1-0.8 mcg/kg/hr Intravenous Titrated Vanna Scotland, MD       docusate sodium (COLACE) capsule 50 mg  50 mg Oral Daily PRN Martinique, Peter M, MD       enoxaparin (LOVENOX) injection 40 mg  40 mg Subcutaneous Q24H Martinique, Peter M, MD   40 mg at 04/23/22  1136   fenofibrate tablet 160 mg  160 mg Oral Daily Martinique, Peter M, MD   160 mg at 04/23/22 1133   insulin aspart (novoLOG) injection 0-9 Units  0-9 Units Subcutaneous TID WC Martinique, Peter M, MD   3 Units at 04/23/22 1132   LORazepam (ATIVAN) injection 1 mg  1 mg Intravenous Q4H PRN Jaci Lazier, MD   1 mg at 04/23/22 0254   metoprolol succinate (TOPROL-XL) 24 hr tablet 25 mg  25 mg Oral Daily Martinique, Peter M, MD   25 mg at 04/23/22 1133   morphine (PF) 2 MG/ML injection 2 mg  2 mg Intravenous Q2H PRN Martinique, Peter M, MD       nitroGLYCERIN (NITROSTAT) SL tablet 0.4 mg  0.4 mg Sublingual Q5 min PRN Martinique, Peter M, MD   0.4 mg at 04/22/22 2919   nitroGLYCERIN 50 mg in  dextrose 5 % 250 mL (0.2 mg/mL) infusion  5 mcg/min Intravenous Continuous Martinique, Peter M, MD 1.5 mL/hr at 04/22/22 2000 5 mcg/min at 04/22/22 2000   ondansetron (ZOFRAN) injection 4 mg  4 mg Intravenous Q6H PRN Martinique, Peter M, MD       pantoprazole (PROTONIX) EC tablet 40 mg  40 mg Oral Daily Martinique, Peter M, MD   40 mg at 04/23/22 1030   rosuvastatin (CRESTOR) tablet 40 mg  40 mg Oral Daily Martinique, Peter M, MD   40 mg at 04/23/22 1132   sodium chloride flush (NS) 0.9 % injection 3 mL  3 mL Intravenous Q12H Martinique, Peter M, MD       sodium chloride flush (NS) 0.9 % injection 3 mL  3 mL Intravenous PRN Martinique, Peter M, MD       ticagrelor [facility]) tablet 90 mg  90 mg Oral BID Martinique, Peter M, MD   90 mg at 04/23/22 1134     Discharge Medications: Please see discharge summary for a list of discharge medications.  Relevant Imaging Results:  Relevant Lab Results:   Additional Information SS# [place], [place]

## 2022-04-23 NOTE — Progress Notes (Addendum)
CARDIAC REHAB PHASE I   PRE:  Rate/Rhythm: 81, SR W/ frequent PVCs, couplets  BP:  Supine: 100/50   SaO2: 96, RA  MODE:  Ambulation: 80 ft   POST:  Rate/Rhythm: 100 SR w/ frequent PVCs. Brief run of Afib during walking.   BP:  Sitting: 159/84     SaO2: 96, RA  Pt initially confused about surroundings and people in room. Pt able to get out of bed mostly independent with mod assist x2 with gait belt. Pt used RW to ambulate with 2x assist. Pt has very shuffled gait, struggles with turns and reversing towards bed, and has tremors that he states are new as of today. Also noted brief Afib upon entry to room. PT eval pending, but pt currently needs 24hr supervision due to fall risk and cognitive impairment. Gave pt MI book, pt fell asleep during explanation. Pt currently n/a for CRP2, but order has been placed. Pt left in bed with call bell in reach.  [place], MS 04/23/2022 1:28 PM    ]

## 2022-04-23 NOTE — Progress Notes (Signed)
1950: Dr. Humphrey Rolls paged for change in pt heart rhythm. VSS at this time. No new orders  2200: pt confused and restless developed a level 2 hematoma on the R groin. Dr. Chancy Milroy paged and asked to come to bedside to evaluate. I held pressure at this site for 20 minutes while Dr. Chancy Milroy arrived at the bedside.  See new orders    2330: pt increasingly agitated and combative trying to pull out arterial sheath and get out of bed. Limbs were retrained by nursing staff to prevent hematoma worsening.  Dr. Chancy Milroy made aware  See new orders Patients son Marden Noble) was also called and updated.   0250: Pt woke up agitated and combative again trying to get out of bed. Pt still on bedrest s/p sheath removal. Dr. Chancy Milroy notified.  Unable to get response back from Dr. Chancy Milroy the rest of the morning, despite multiple pages See [facility]

## 2022-04-23 NOTE — Progress Notes (Signed)
Progress Note  Patient Name: Tony Lee Date of Encounter: 04/23/2022  Primary Cardiologist:   Peter Martinique, MD   Subjective   Patient agitated through the night.  He is still mildly confused but he seems to have cleared for the most part.  He denies pain or SOB.  Up in chair.   Inpatient Medications    Scheduled Meds:  aspirin EC  81 mg Oral Daily   atropine       Chlorhexidine Gluconate Cloth  6 each Topical Daily   enoxaparin (LOVENOX) injection  40 mg Subcutaneous Q24H   fenofibrate  160 mg Oral Daily   insulin aspart  0-9 Units Subcutaneous TID WC   metoprolol succinate  25 mg Oral Daily   pantoprazole  40 mg Oral Daily   rosuvastatin  40 mg Oral Daily   sodium chloride flush  3 mL Intravenous Q12H   ticagrelor  90 mg Oral BID   Continuous Infusions:  sodium chloride     nitroGLYCERIN 5 mcg/min (04/22/22 2000)   PRN Meds: sodium chloride, acetaminophen, atropine, docusate sodium, LORazepam, morphine injection, nitroGLYCERIN, ondansetron (ZOFRAN) IV, sodium chloride flush   Vital Signs    Vitals:   04/23/22 0300 04/23/22 0400 04/23/22 0500 04/23/22 0740  BP: 95/63 107/72 (!) 130/104   Pulse: 85 91    Resp: 14 19 (!) 21   Temp:    97.7 F (36.5 C)  TempSrc:    Oral  SpO2: 96% 100%    Weight:      Height:        Intake/Output Summary (Last 24 hours) at 04/23/2022 0744 Last data filed at 04/23/2022 0400 Gross per 24 hour  Intake 1278.97 ml  Output 1200 ml  Net 78.97 ml   Filed Weights   04/20/22 1015 04/21/22 0000 04/22/22 0017  Weight: 88.5 kg 82 kg 80.7 kg    Telemetry    NSR, PVCs - Personally Reviewed  ECG    NA (PENDING) - Personally Reviewed  Physical Exam   GEN: No acute distress.   Neck: No  JVD Cardiac: RRR, murmurs, rubs, or gallops.  Respiratory: Clear  to auscultation bilaterally. GI: Soft, nontender, non-distended  MS:    Right femoral site without bleeding or bruising, no edema; No deformity. Neuro:  Nonfocal  Psych:  Normal affect but mildly confused.    Labs    Chemistry Recent Labs  Lab 04/21/22 0308 04/22/22 1022 04/22/22 1839 04/22/22 2042 04/22/22 2043  NA 139 135 138 134* 137  K 4.1 4.4 3.9 4.0 4.1  CL 105 105  --  107 103  CO2 26 23  --  19*  --   GLUCOSE 154* 173*  --  192* 198*  BUN 26* 26*  --  25* 25*  CREATININE 1.57* 1.59*  --  1.67* 1.80*  CALCIUM 9.3 9.1  --  8.9  --   PROT 5.9*  --   --   --   --   ALBUMIN 3.1*  --   --   --   --   AST 13*  --   --   --   --   ALT 11  --   --   --   --   ALKPHOS 39  --   --   --   --   BILITOT 0.6  --   --   --   --   GFRNONAA 42* 41*  --  39*  --   ANIONGAP  8 7  --  8  --      Hematology Recent Labs  Lab 04/20/22 1022 04/22/22 0711 04/22/22 1839 04/22/22 2043 04/22/22 2224  WBC 8.0 10.0  --   --  9.8  RBC 4.20* 4.36  --   --  4.03*  HGB 13.4 14.2 12.9* 12.9* 13.1  HCT 40.9 41.0 38.0* 38.0* 37.7*  MCV 97.4 94.0  --   --  93.5  MCH 31.9 32.6  --   --  32.5  MCHC 32.8 34.6  --   --  34.7  RDW 12.2 12.1  --   --  11.9  PLT 240 245  --   --  238    Cardiac EnzymesNo results for input(s): "TROPONINI" in the last 168 hours. No results for input(s): "TROPIPOC" in the last 168 hours.   BNPNo results for input(s): "BNP", "PROBNP" in the last 168 hours.   DDimer No results for input(s): "DDIMER" in the last 168 hours.   Radiology    CARDIAC CATHETERIZATION  Result Date: 04/22/2022   Prox LAD to Mid LAD lesion is 70% stenosed.   1st Diag-1 lesion is 100% stenosed.   1st Diag-2 lesion is 70% stenosed.   Prox RCA lesion is 95% stenosed.   Mid RCA lesion is 100% stenosed.   Prox Cx lesion is 40% stenosed.   1st Mrg lesion is 100% stenosed.   3rd Mrg lesion is 100% stenosed.   RPAV lesion is 95% stenosed.   A drug-eluting stent was successfully placed using a [place] 2.25X15.   Post intervention, there is a 0% residual stenosis.   LIMA graft was visualized by angiography and is normal in caliber.   SVG graft was visualized  by angiography and is normal in caliber.   SVG graft was visualized by angiography and is normal in caliber.   The graft exhibits no disease.   The graft exhibits no disease.   LV end diastolic pressure is mildly elevated. 3 vessel obstructive CAD, there is a 70% stenosis in the mid LAD which was not grafted Patent LIMA to a large diagonal branch. There is 70% stenosis in the diagonal distal to the graft. Patent SVG to OM1 and 2 Patent SVG to distal RCA. There is a severe stenosis in the PL branch distal to the graft. This appears to be the culprit lesion. Mildly elevated LVEDP 18 mm Hg Successful PCI of the PL branch of the RCA through the vein graft with DES x 1 Severe tortuosity and calcification of the aortoiliac system which made the procedure challenging. Plan: DAPT with ASA and Brilinta for 12 months. Monitor renal function closely. Would treat residual disease in the LAD and diagonal medically. The LAD could be treated with PCI if he has refractory symptoms.    Cardiac Studies   CARDIAC CATH:    Diagnostic Dominance: Right  Intervention    Patient Profile     88 y.o. male with NSTEMI s/p CABG 2008, DM, dyslipidemia, chronic RBBB, esophageal stricture, prostate CA, monoclonal gammopathy, depression (no dementia per 2022 neuro visit), tremor with falls, CKD stage 3b, monoclonal gammopathy who presented to the ER with chest pain found to have mildly elevated troponin for which Cardiology was consulted.  Assessment & Plan    NSTEMI:   Patient had PCI of a PL branch and will have medical management of residual disease particularly in the LAD.  Ambulate and possibly home later today.    If discharged I would  wait a couple of days to restart Cozaar and not restart the beta blocker.    CKD IIIB:   Creat mildly increased at 1.8.   Chronic RBBB, also with first degree AVB:  Holding beta blocker.    Essential HTN:    BP at target.  IV NTG is off.    For questions or updates, please contact  [place] Please consult [email or web address] for contact info under Cardiology/STEMI.   Signed, Minus Breeding, MD  04/23/2022, 7:44 AM

## 2022-04-23 NOTE — Progress Notes (Signed)
Patient agitated refusing morning blood work, EKG at this time .

## 2022-04-23 NOTE — Progress Notes (Signed)
Patient iv removed and RN handed off to [facility] for transport.

## 2022-04-23 NOTE — TOC Transition Note (Signed)
Transition of Care [facility]) - CM/SW Discharge Note   Patient Details  Name: Tony Lee MRN: [id number] Date of Birth: [birth year]  [facility] of [facility]) CM/SW Contact:  Coralee P[place], [place] Phone Number: 04/23/2022, 2:33 PM   Clinical Narrative:    Pt to be transported to [facility] via [place]. Nurse to call report to [phone].   Final next level of care: Skilled Nursing Facility Barriers to Discharge: Barriers Resolved   Patient Goals and CMS Choice        Discharge Placement              Patient chooses bed at: [facility] Patient to be transferred to facility by: [place] Name of family member notified: Pollie Meyer Patient and family notified of of transfer: 04/23/22  Discharge Plan and Services                                     Social Determinants of Health (SDOH) Interventions     Readmission Risk Interventions     No data to display

## 2022-04-23 NOTE — Discharge Summary (Signed)
Physician Discharge Summary   Patient: Tony Lee MRN: [id number] DOB: 1931/05/12  Admit date:     04/20/2022  Discharge date: 04/23/22  Discharge Physician: Vanna Scotland   PCP: Haywood Pao, MD   Recommendations at discharge:      Discharge Diagnoses: Principal Problem:   Chest pain Active Problems:   Unstable angina (HCC)   Non-ST elevation (NSTEMI) myocardial infarction [facility])  Resolved Problems:   * No resolved hospital problems. [facility] Course: 88 y.o. male with medical history significant of CAD/MI s/p CABG with 6 vessels in 2018, with a normal nuclear stress test 2018, HTN, IIDM, CKD stage IIIb, HLD, presented with chest pain. Admitted 8/13-8/14.   8/13- underwent LHC, s/p PCI of PL branch  8/13 LHC - abbrev 3 vessel obstructive CAD, there is a 70% stenosis in the mid LAD which was not grafted Patent LIMA to a large diagonal branch. There is 70% stenosis in the diagonal distal to the graft. Patent SVG to distal RCA. There is a severe stenosis in the PL branch distal to the graft. This appears to be the culprit lesion. Successful PCI of the PL branch of the RCA through the vein graft with DES x 1  Assessment and Plan: #1  Unstable angina chest pain consistent with his prior angina.  Patient with history of CAD and CABG x6 in 2018.  Patient admitted with pain in both arms with ambulation.  Patient was started on nitro drip today due to chest pain.  His troponin peaked at 114.  Follow levels.  Continue heparin drip and nitro drip per cardiology.  Planning for cath in a.m. or sooner.  Undwent PCI to PL branch and recommended medical management of LAD - awaiting PT eval and resolution of delirium/adv dementia Likely home in 24H - avoid benzo Can trial precedex for agitation but would prefer reorientation, verbal cues and family members to assist in improving symptoms   #2.  History of essential hypertension on nitro drip holding.   #3.  Stage IIIb CKD-stable  ACE inhibitor    #4 type 2 diabetes with hyperglycemia last A1c 8.0.  On Tradjenta and Farxiga prior to admission. Continue SSI.   #5 hyperlipidemia on Crestor and fenofibrate      Pain control - [employer] Controlled Substance Reporting System database was reviewed. and patient was instructed, not to drive, operate heavy machinery, perform activities at heights, swimming or participation in water activities or provide baby-sitting services while on Pain, Sleep and Anxiety Medications; until their outpatient Physician has advised to do so again. Also recommended to not to take more than prescribed Pain, Sleep and Anxiety Medications.  Consultants: Cardiology Procedures performed:  LHC  Disposition: Skilled nursing facility Diet recommendation:  Discharge Diet Orders (From admission, onward)     Start     Ordered   04/23/22 0000  Diet - low sodium heart healthy        04/23/22 1355           Cardiac diet DISCHARGE MEDICATION: Allergies as of 04/23/2022       Reactions   Other Itching    Mepergan Fortis, (meprozine)  = itching (patient disputes this in 2023)        Medication List     STOP taking these medications    aspirin 325 MG tablet Replaced by: aspirin EC 81 MG tablet   docusate sodium 100 MG capsule Commonly known as: COLACE   Fenofibric Acid 45 MG Cpdr  metoprolol succinate 25 MG 24 hr tablet Commonly known as: TOPROL-XL       TAKE these medications    aspirin EC 81 MG tablet Take 1 tablet (81 mg total) by mouth daily. Swallow whole. Start taking on: April 24, 2022 Replaces: aspirin 325 MG tablet   Black Pepper-Turmeric 3-500 MG Caps Take 1 capsule by mouth every Monday, Wednesday, and Friday.   Farxiga 5 MG Tabs tablet Generic drug: dapagliflozin propanediol Take 5 mg by mouth daily.   fenofibrate 160 MG tablet Take 1 tablet (160 mg total) by mouth daily. Start taking on: April 24, 2022   losartan 25 MG tablet Commonly known as:  COZAAR Take 1 tablet (25 mg total) by mouth daily. Start taking on: April 25, 2022 What changed: These instructions start on April 25, 2022. If you are unsure what to do until then, ask your doctor or other care provider.   nitroGLYCERIN 0.4 MG SL tablet Commonly known as: Nitrostat PLACE 1 TABLET (0.4 MG TOTAL) UNDER THE TONGUE EVERY 5 (FIVE) MINUTES AS NEEDED.   omeprazole 40 MG capsule Commonly known as: PRILOSEC Take 40 mg by mouth daily before breakfast.   polyethylene glycol powder 17 GM/SCOOP powder Commonly known as: GLYCOLAX/MIRALAX Take 17 g by mouth at bedtime as needed for mild constipation.   simvastatin 20 MG tablet Commonly known as: ZOCOR Take 20 mg by mouth at bedtime.   ticagrelor 90 MG Tabs tablet Commonly known as: BRILINTA Take 1 tablet (90 mg total) by mouth 2 (two) times daily.   Tradjenta 5 MG Tabs tablet Generic drug: linagliptin Take 5 mg by mouth daily.   VITAMIN B-12 PO Take 5,000 mcg by mouth at bedtime.   Voltaren 1 % Gel Generic drug: diclofenac Sodium Apply 4 g topically See admin instructions. Apply to affected area of right wrist 4 times a day   ZyrTEC Allergy 10 MG tablet Generic drug: cetirizine Take 10 mg by mouth daily as needed for allergies.        Discharge Exam: Filed Weights   04/20/22 1015 04/21/22 0000 04/22/22 0017  Weight: 88.5 kg 82 kg 80.7 kg   General exam: NAD, resting in bed   Respiratory system: Clear to auscultation. Respiratory effort normal. Cardiovascular system: S1 & S2 heard, RRR. No JVD, murmurs, rubs, gallops or clicks.  Trace pedal edema. Gastrointestinal system: Abdomen is nondistended, soft and nontender. No organomegaly or masses felt. Normal bowel sounds heard. Central nervous system: Alert and oriented. No focal neurological deficits. Extremities: Trace edema bilaterally Skin: No rashes, lesions or ulcers Psychiatry: Judgement and insight appear normal. Mood & affect appropriate.    Condition at discharge: good  The results of significant diagnostics from this hospitalization (including imaging, microbiology, ancillary and laboratory) are listed below for reference.   Imaging Studies: CARDIAC CATHETERIZATION  Result Date: 04/22/2022   Prox LAD to Mid LAD lesion is 70% stenosed.   1st Diag-1 lesion is 100% stenosed.   1st Diag-2 lesion is 70% stenosed.   Prox RCA lesion is 95% stenosed.   Mid RCA lesion is 100% stenosed.   Prox Cx lesion is 40% stenosed.   1st Mrg lesion is 100% stenosed.   3rd Mrg lesion is 100% stenosed.   RPAV lesion is 95% stenosed.   A drug-eluting stent was successfully placed using a Farley 2.25X15.   Post intervention, there is a 0% residual stenosis.   LIMA graft was visualized by angiography and is normal in caliber.  SVG graft was visualized by angiography and is normal in caliber.   SVG graft was visualized by angiography and is normal in caliber.   The graft exhibits no disease.   The graft exhibits no disease.   LV end diastolic pressure is mildly elevated. 3 vessel obstructive CAD, there is a 70% stenosis in the mid LAD which was not grafted Patent LIMA to a large diagonal branch. There is 70% stenosis in the diagonal distal to the graft. Patent SVG to OM1 and 2 Patent SVG to distal RCA. There is a severe stenosis in the PL branch distal to the graft. This appears to be the culprit lesion. Mildly elevated LVEDP 18 mm Hg Successful PCI of the PL branch of the RCA through the vein graft with DES x 1 Severe tortuosity and calcification of the aortoiliac system which made the procedure challenging. Plan: DAPT with ASA and Brilinta for 12 months. Monitor renal function closely. Would treat residual disease in the LAD and diagonal medically. The LAD could be treated with PCI if he has refractory symptoms.   ECHOCARDIOGRAM COMPLETE  Result Date: 04/20/2022    ECHOCARDIOGRAM REPORT   Patient Name:   NAKOTA ELSEN Date of Exam: 04/20/2022  Medical Rec #:  [id number]       Height:       67.0 in Accession #:    [id number]      Weight:       195.0 lb Date of Birth:  16-May-1931       BSA:          2.001 m Patient Age:    44 years        BP:           180/86 mmHg Patient Gender: M               HR:           53 bpm. Exam Location:  Inpatient Procedure: 2D Echo, Cardiac Doppler and Color Doppler Indications:    Chest pain  History:        Patient has no prior history of Echocardiogram examinations.                 Previous Myocardial Infarction and CAD, Prior CABG;                 Arrythmias:Bradycardia.  Sonographer:    Merrie Roof RDCS Referring Phys: [id number] [place]  1. Left ventricular ejection fraction, by estimation, is 60 to 65%. The left ventricle has normal function. The left ventricle has no regional wall motion abnormalities. There is mild left ventricular hypertrophy of the basal-septal segment. Left ventricular diastolic parameters are consistent with Grade I diastolic dysfunction (impaired relaxation).  2. Right ventricular systolic function is normal. The right ventricular size is normal.  3. The mitral valve is normal in structure. No evidence of mitral valve regurgitation. No evidence of mitral stenosis.  4. The aortic valve is tricuspid. Aortic valve regurgitation is trivial. Aortic valve sclerosis/calcification is present, without any evidence of aortic stenosis.  5. The inferior vena cava is normal in size with greater than 50% respiratory variability, suggesting right atrial pressure of 3 mmHg. FINDINGS  Left Ventricle: Left ventricular ejection fraction, by estimation, is 60 to 65%. The left ventricle has normal function. The left ventricle has no regional wall motion abnormalities. The left ventricular internal cavity size was normal in size. There is  mild left ventricular hypertrophy of the basal-septal  segment. Left ventricular diastolic parameters are consistent with Grade I diastolic dysfunction (impaired  relaxation). Normal left ventricular filling pressure. Right Ventricle: The right ventricular size is normal. No increase in right ventricular wall thickness. Right ventricular systolic function is normal. Left Atrium: Left atrial size was normal in size. Right Atrium: Right atrial size was normal in size. Pericardium: There is no evidence of pericardial effusion. Mitral Valve: The mitral valve is normal in structure. No evidence of mitral valve regurgitation. No evidence of mitral valve stenosis. Tricuspid Valve: The tricuspid valve is normal in structure. Tricuspid valve regurgitation is trivial. No evidence of tricuspid stenosis. Aortic Valve: The aortic valve is tricuspid. Aortic valve regurgitation is trivial. Aortic valve sclerosis/calcification is present, without any evidence of aortic stenosis. Pulmonic Valve: The pulmonic valve was normal in structure. Pulmonic valve regurgitation is trivial. No evidence of pulmonic stenosis. Aorta: The aortic root is normal in size and structure. Venous: The inferior vena cava is normal in size with greater than 50% respiratory variability, suggesting right atrial pressure of 3 mmHg. IAS/Shunts: No atrial level shunt detected by color flow Doppler.  LEFT VENTRICLE PLAX 2D LVIDd:         4.50 cm   Diastology LVIDs:         3.00 cm   LV e' medial:    6.64 cm/s LV PW:         0.90 cm   LV E/e' medial:  8.5 LV IVS:        0.90 cm   LV e' lateral:   9.14 cm/s LVOT diam:     2.10 cm   LV E/e' lateral: 6.2 LV SV:         73 LV SV Index:   37 LVOT Area:     3.46 cm  RIGHT VENTRICLE RV Basal diam:  2.90 cm RV S prime:     6.60 cm/s TAPSE (M-mode): 1.5 cm LEFT ATRIUM             Index        RIGHT ATRIUM           Index LA diam:        4.30 cm 2.15 cm/m   RA Area:     18.30 cm LA Vol (A2C):   51.7 ml 25.84 ml/m  RA Volume:   48.30 ml  24.14 ml/m LA Vol (A4C):   52.0 ml 25.99 ml/m LA Biplane Vol: 51.9 ml 25.94 ml/m  AORTIC VALVE LVOT Vmax:   94.20 cm/s LVOT Vmean:  59.100  cm/s LVOT VTI:    0.212 m  AORTA Ao Root diam: 3.50 cm Ao Asc diam:  3.10 cm MITRAL VALVE MV Area (PHT): 2.66 cm    SHUNTS MV Decel Time: 285 msec    Systemic VTI:  0.21 m MV E velocity: 56.40 cm/s  Systemic Diam: 2.10 cm MV A velocity: 70.60 cm/s MV E/A ratio:  0.80 Fransico Him MD Electronically signed by Fransico Him MD Signature Date/Time: 04/20/2022/3:00:09 PM    Final    DG Chest 2 View  Result Date: 04/20/2022 CLINICAL DATA:  Chest pain in center of chest pressure radiating to the left arm. EXAM: CHEST - 2 VIEW COMPARISON:  Chest two views 07/24/2007 FINDINGS: Status post median sternotomy and CABG. Cardiac silhouette and mediastinal contours are within normal limits. Mild calcification within the aortic arch. Unchanged calcified benign granuloma within the inferior medial anterior right lower lobe. Mild bibasilar bronchovascular crowding. No pleural effusion or pneumothorax.  Partial visualization of right shoulder arthroplasty. IMPRESSION: No active cardiopulmonary disease. Electronically Signed   By: Yvonne Kendall M.D.   On: 04/20/2022 10:53    Microbiology: Results for orders placed or performed during the hospital encounter of 04/20/22  MRSA Next Gen by PCR, Nasal     Status: None   Collection Time: 04/22/22  8:52 PM   Specimen: Nasal Mucosa; Nasal Swab  Result Value Ref Range Status   MRSA by PCR Next Gen NOT DETECTED NOT DETECTED Final    Comment: (NOTE) The GeneXpert MRSA Assay (FDA approved for NASAL specimens only), is one component of a comprehensive MRSA colonization surveillance program. It is not intended to diagnose MRSA infection nor to guide or monitor treatment for MRSA infections. Test performance is not FDA approved in patients less than 38 years old. Performed at [facility], [address] [place], [place] [address]     Labs: CBC: Recent Labs  Lab 04/20/22 1022 04/22/22 0711 04/22/22 1839 04/22/22 2043 04/22/22 2224 04/23/22 1025  WBC 8.0 10.0  --    --  9.8 7.1  HGB 13.4 14.2 12.9* 12.9* 13.1 13.5  HCT 40.9 41.0 38.0* 38.0* 37.7* 39.1  MCV 97.4 94.0  --   --  93.5 94.7  PLT 240 245  --   --  238 025   Basic Metabolic Panel: Recent Labs  Lab 04/20/22 1022 04/21/22 0308 04/22/22 1022 04/22/22 1839 04/22/22 2042 04/22/22 2043 04/23/22 1025  NA 136 139 135 138 134* 137 136  K 4.5 4.1 4.4 3.9 4.0 4.1 4.1  CL 107 105 105  --  107 103 105  CO2 21* 26 23  --  19*  --  23  GLUCOSE 306* 154* 173*  --  192* 198* 197*  BUN 30* 26* 26*  --  25* 25* 26*  CREATININE 1.59* 1.57* 1.59*  --  1.67* 1.80* 1.81*  CALCIUM 9.2 9.3 9.1  --  8.9  --  9.0  MG  --   --   --   --  1.8  --   --    Liver Function Tests: Recent Labs  Lab 04/21/22 0308  AST 13*  ALT 11  ALKPHOS 39  BILITOT 0.6  PROT 5.9*  ALBUMIN 3.1*   CBG: Recent Labs  Lab 04/22/22 1121 04/22/22 1610 04/22/22 2127 04/23/22 0620 04/23/22 1111  GLUCAP 160* 174* 192* 194* 217*    Discharge time spent: less than 30 minutes.  Signed: Vanna Scotland, MD [facility] 04/23/2022

## 2022-04-24 DIAGNOSIS — R278 Other lack of coordination: Secondary | ICD-10-CM | POA: Diagnosis not present

## 2022-04-24 DIAGNOSIS — N1832 Chronic kidney disease, stage 3b: Secondary | ICD-10-CM | POA: Diagnosis not present

## 2022-04-24 DIAGNOSIS — G3184 Mild cognitive impairment, so stated: Secondary | ICD-10-CM | POA: Diagnosis not present

## 2022-04-24 DIAGNOSIS — R2689 Other abnormalities of gait and mobility: Secondary | ICD-10-CM | POA: Diagnosis not present

## 2022-04-24 DIAGNOSIS — I200 Unstable angina: Secondary | ICD-10-CM | POA: Diagnosis not present

## 2022-04-24 DIAGNOSIS — M6389 Disorders of muscle in diseases classified elsewhere, multiple sites: Secondary | ICD-10-CM | POA: Diagnosis not present

## 2022-04-24 LAB — LIPOPROTEIN A (LPA): Lipoprotein (a): 74.3 nmol/L — ABNORMAL HIGH (ref <75.0)

## 2022-04-24 MED FILL — Nitroglycerin IV Soln 100 MCG/ML in D5W: INTRA_ARTERIAL | Qty: 10 | Status: AC

## 2022-04-24 NOTE — Progress Notes (Addendum)
Arterial Sheath pulled per order. Prior to pull site a level 1. Manual pressure held for 20 minutes. Site level 0 after pull. Pt remained hemodynamically stable throughout sheath pull and after.     Patient educated on complications related to sheathe pull.

## 2022-04-25 DIAGNOSIS — R2689 Other abnormalities of gait and mobility: Secondary | ICD-10-CM | POA: Diagnosis not present

## 2022-04-25 DIAGNOSIS — G3184 Mild cognitive impairment, so stated: Secondary | ICD-10-CM | POA: Diagnosis not present

## 2022-04-25 DIAGNOSIS — R278 Other lack of coordination: Secondary | ICD-10-CM | POA: Diagnosis not present

## 2022-04-25 DIAGNOSIS — N1832 Chronic kidney disease, stage 3b: Secondary | ICD-10-CM | POA: Diagnosis not present

## 2022-04-25 DIAGNOSIS — M6389 Disorders of muscle in diseases classified elsewhere, multiple sites: Secondary | ICD-10-CM | POA: Diagnosis not present

## 2022-04-25 DIAGNOSIS — I200 Unstable angina: Secondary | ICD-10-CM | POA: Diagnosis not present

## 2022-04-26 ENCOUNTER — Encounter: Payer: Self-pay | Admitting: Adult Health

## 2022-04-26 ENCOUNTER — Non-Acute Institutional Stay (SKILLED_NURSING_FACILITY): Payer: PPO | Admitting: Adult Health

## 2022-04-26 ENCOUNTER — Telehealth (HOSPITAL_COMMUNITY): Payer: Self-pay

## 2022-04-26 DIAGNOSIS — E669 Obesity, unspecified: Secondary | ICD-10-CM

## 2022-04-26 DIAGNOSIS — E1169 Type 2 diabetes mellitus with other specified complication: Secondary | ICD-10-CM

## 2022-04-26 DIAGNOSIS — G3184 Mild cognitive impairment, so stated: Secondary | ICD-10-CM | POA: Diagnosis not present

## 2022-04-26 DIAGNOSIS — E785 Hyperlipidemia, unspecified: Secondary | ICD-10-CM

## 2022-04-26 DIAGNOSIS — N1832 Chronic kidney disease, stage 3b: Secondary | ICD-10-CM | POA: Diagnosis not present

## 2022-04-26 DIAGNOSIS — R2689 Other abnormalities of gait and mobility: Secondary | ICD-10-CM

## 2022-04-26 DIAGNOSIS — I214 Non-ST elevation (NSTEMI) myocardial infarction: Secondary | ICD-10-CM

## 2022-04-26 DIAGNOSIS — I2510 Atherosclerotic heart disease of native coronary artery without angina pectoris: Secondary | ICD-10-CM

## 2022-04-26 DIAGNOSIS — I200 Unstable angina: Secondary | ICD-10-CM | POA: Diagnosis not present

## 2022-04-26 DIAGNOSIS — E119 Type 2 diabetes mellitus without complications: Secondary | ICD-10-CM

## 2022-04-26 DIAGNOSIS — R278 Other lack of coordination: Secondary | ICD-10-CM | POA: Diagnosis not present

## 2022-04-26 DIAGNOSIS — M6389 Disorders of muscle in diseases classified elsewhere, multiple sites: Secondary | ICD-10-CM | POA: Diagnosis not present

## 2022-04-26 NOTE — Progress Notes (Signed)
Location:  [facility] Room Number: Rehab 154A Place of Service:  SNF [phone]) Provider:  Royal Hawthorn, NP  Tisovec, Fransico Him, MD  Patient Care Team: Tisovec, Fransico Him, MD as PCP - General (Internal Medicine) Martinique, Peter M, MD as PCP - Cardiology (Cardiology) Tat, Eustace Quail, DO as Attending Physician (Neurology) Martinique, Peter M, MD as Attending Physician (Cardiology) Nobie Putnam, MD (Hematology and Oncology) Clarene Essex, MD as Attending Physician (Gastroenterology)  Extended Emergency Contact Information Primary Emergency Contact: Cordrey,Elaine Address: [address]          [place], [place] [address] Johnnette Litter of [place] Phone: [phone] Mobile Phone: [phone] Relation: Spouse Secondary Emergency Contact: Fakhouri,doug Mobile Phone: [phone] Relation: Son  Code Status:  DNR Goals of care: Advanced Directive information    04/20/2022   10:14 AM  Advanced Directives  Does Patient Have a Medical Advance Directive? No  Would patient like information on creating a medical advance directive? No - Patient declined     Chief Complaint  Patient presents with   Hospitalization Follow-up    Hospital Follow up    HPI:  Pt is a 88 y.o. male seen today for an acute visit for hospital follow up. He was admitted 04/20/22-04/23/22 due to chest pain and in both arms.  PMH significant for CAD MI s/p CABG in 2018, HTN, DM II, CKD, HLD, MCI.  He was started on a nitro gtt. Troponin peaked at 1042. He was also on a Heparin gtt.  He had a left heart cath 04/22/22 showed 3 vessel obstructive CAD with 70% stenosis of the LAD. Also severe stenosis of the PL branch distal to graft which was the culprit lesion. He had a succesful PCI of the PL brach of the RCA with DES x 1. Cardiology recommended DAPT with ASA and Brilinta for 12 months. They recommend treating the LAD medically. Also could have further PCI for refractory symptoms.  2 D echo showed  EF 60-65% with grade 1 DD Dr. Percival Spanish notes indicate holding off on starting a BB  He does have a hx of MCI and prior to this event lived in [place] under the care of Dr. Osborne Casco. He had some delirium during his stay which has improved. He is in skilled rehab for therapy. Remains ambulatory with  a walker with shuffling slow gait, also has a tremor.  MMSE 27/30.  Has seen Dr Jaynee Eagles and neuropsych.  Past Medical History:  Diagnosis Date   Cancer ([facility]) 09/10/2002   prostate   Chronic kidney disease, stage 3b (HCC)    Coronary artery disease    Depression    Diabetes mellitus type 2 in obese (HCC)    Dyslipidemia    Esophageal stricture    Monoclonal paraproteinemia 03/03/2013   Palpitations    RBBB (right bundle branch block)    Past Surgical History:  Procedure Laterality Date   ANKLE SURGERY Left    APPENDECTOMY     CARDIAC CATHETERIZATION     Dr. Martinique   CATARACT EXTRACTION W/ INTRAOCULAR LENS  IMPLANT, BILATERAL     CORONARY ARTERY BYPASS GRAFT  Oct. 2008   x 5,Dr.Gerhardt   CORONARY/GRAFT ACUTE MI REVASCULARIZATION N/A 04/22/2022   Procedure: Coronary/Graft Acute MI Revascularization;  Surgeon: Martinique, Peter M, MD;  Location: [facility];  Service: Cardiovascular;  Laterality: N/A;   HERNIA REPAIR Bilateral    LEFT HEART CATH AND CORONARY ANGIOGRAPHY N/A 04/22/2022   Procedure: LEFT HEART CATH AND  CORONARY ANGIOGRAPHY;  Surgeon: Martinique, Peter M, MD;  Location: [facility];  Service: Cardiovascular;  Laterality: N/A;   PROSTATECTOMY  2004   radical   right shoulder      replacement   vein strippping      Allergies  Allergen Reactions   Other Itching     Mepergan Fortis, (meprozine)  = itching (patient disputes this in 2023)    Outpatient Encounter Medications as of 04/26/2022  Medication Sig   aspirin EC 81 MG tablet Take 1 tablet (81 mg total) by mouth daily. Swallow whole.   Black Pepper-Turmeric 3-500 MG CAPS Take 1 capsule by mouth every Monday, Wednesday,  and Friday.   Cyanocobalamin (VITAMIN B-12 PO) Take 5,000 mcg by mouth at bedtime.   diclofenac Sodium (VOLTAREN) 1 % GEL Apply 4 g topically See admin instructions. Apply to affected area of right wrist 4 times a day   FARXIGA 5 MG TABS tablet Take 5 mg by mouth daily.   fenofibrate 160 MG tablet Take 1 tablet (160 mg total) by mouth daily.   losartan (COZAAR) 25 MG tablet Take 1 tablet (25 mg total) by mouth daily.   nitroGLYCERIN (NITROSTAT) 0.4 MG SL tablet PLACE 1 TABLET (0.4 MG TOTAL) UNDER THE TONGUE EVERY 5 (FIVE) MINUTES AS NEEDED.   omeprazole (PRILOSEC) 40 MG capsule Take 40 mg by mouth daily before breakfast.   polyethylene glycol powder (GLYCOLAX/MIRALAX) 17 GM/SCOOP powder Take 17 g by mouth at bedtime as needed for mild constipation.   simvastatin (ZOCOR) 20 MG tablet Take 20 mg by mouth at bedtime.     ticagrelor (BRILINTA) 90 MG TABS tablet Take 1 tablet (90 mg total) by mouth 2 (two) times daily.   TRADJENTA 5 MG TABS tablet Take 5 mg by mouth daily.   ZYRTEC ALLERGY 10 MG tablet Take 10 mg by mouth daily as needed for allergies.   No facility-administered encounter medications on file as of 04/26/2022.    Review of Systems  Constitutional:  Positive for activity change. Negative for appetite change, chills, diaphoresis, fatigue, fever and unexpected weight change.  Respiratory:  Negative for cough, shortness of breath, wheezing and stridor.   Cardiovascular:  Negative for chest pain, palpitations and leg swelling.  Gastrointestinal:  Negative for abdominal distention, abdominal pain, constipation and diarrhea.  Genitourinary:  Negative for difficulty urinating and dysuria.  Musculoskeletal:  Positive for gait problem. Negative for arthralgias, back pain, joint swelling and myalgias.  Skin:  Positive for color change.  Neurological:  Negative for dizziness, seizures, syncope, facial asymmetry, speech difficulty, weakness and headaches.  Hematological:  Negative for  adenopathy. Does not bruise/bleed easily.  Psychiatric/Behavioral:  Positive for confusion. Negative for agitation and behavioral problems.      There is no immunization history on file for this patient. Pertinent  Health Maintenance Due  Topic Date Due   FOOT EXAM  Never done   OPHTHALMOLOGY EXAM  Never done   INFLUENZA VACCINE  04/10/2022   HEMOGLOBIN A1C  10/21/2022      04/21/2022    1:00 AM 04/21/2022    9:00 AM 04/22/2022    7:45 AM 04/22/2022   10:00 PM 04/23/2022   10:00 AM  Fall Risk  Patient Fall Risk Level High fall risk High fall risk High fall risk High fall risk High fall risk   Functional Status Survey:    Vitals:   04/26/22 0845  BP: 98/60  Pulse: 76  Resp: 20  Temp: 98 F (  36.7 C)  TempSrc: Skin  SpO2: 97%  Weight: 176 lb 9.6 oz (80.1 kg)  Height: '5\' 4"'$  (1.626 m)   Body mass index is 30.31 kg/m. Physical Exam Vitals and nursing note reviewed.  Constitutional:      General: He is not in acute distress.    Appearance: He is not diaphoretic.  HENT:     Head: Normocephalic and atraumatic.  Neck:     Thyroid: No thyromegaly.     Vascular: No JVD.     Trachea: No tracheal deviation.  Cardiovascular:     Rate and Rhythm: Normal rate and regular rhythm.     Heart sounds: No murmur heard. Pulmonary:     Effort: Pulmonary effort is normal. No respiratory distress.     Breath sounds: Normal breath sounds. No wheezing.  Abdominal:     General: Bowel sounds are normal. There is no distension.     Palpations: Abdomen is soft.     Tenderness: There is no abdominal tenderness.  Musculoskeletal:     Cervical back: Normal range of motion and neck supple.     Right lower leg: No edema.     Left lower leg: No edema.  Lymphadenopathy:     Cervical: No cervical adenopathy.  Skin:    General: Skin is warm and dry.     Findings: Bruising (right groin area) present.  Neurological:     Mental Status: He is alert and oriented to person, place, and time.      Cranial Nerves: No cranial nerve deficit.  Psychiatric:        Mood and Affect: Mood normal.     Labs reviewed: Recent Labs    04/22/22 1022 04/22/22 1839 04/22/22 2042 04/22/22 2043 04/23/22 1025  NA 135   < > 134* 137 136  K 4.4   < > 4.0 4.1 4.1  CL 105  --  107 103 105  CO2 23  --  19*  --  23  GLUCOSE 173*  --  192* 198* 197*  BUN 26*  --  25* 25* 26*  CREATININE 1.59*  --  1.67* 1.80* 1.81*  CALCIUM 9.1  --  8.9  --  9.0  MG  --   --  1.8  --   --    < > = values in this interval not displayed.   Recent Labs    04/21/22 0308  AST 13*  ALT 11  ALKPHOS 39  BILITOT 0.6  PROT 5.9*  ALBUMIN 3.1*   Recent Labs    04/22/22 0711 04/22/22 1839 04/22/22 2043 04/22/22 2224 04/23/22 1025  WBC 10.0  --   --  9.8 7.1  HGB 14.2   < > 12.9* 13.1 13.5  HCT 41.0   < > 38.0* 37.7* 39.1  MCV 94.0  --   --  93.5 94.7  PLT 245  --   --  238 208   < > = values in this interval not displayed.   Lab Results  Component Value Date   TSH 3.233 04/21/2022   Lab Results  Component Value Date   HGBA1C 8.0 (H) 04/20/2022   Lab Results  Component Value Date   CHOL 105 04/21/2022   HDL 37 (L) 04/21/2022   LDLCALC 42 04/21/2022   TRIG 132 04/21/2022   CHOLHDL 2.8 04/21/2022    Significant Diagnostic Results in last 30 days:  CARDIAC CATHETERIZATION  Result Date: 04/22/2022   Prox LAD to Mid LAD lesion is  70% stenosed.   1st Diag-1 lesion is 100% stenosed.   1st Diag-2 lesion is 70% stenosed.   Prox RCA lesion is 95% stenosed.   Mid RCA lesion is 100% stenosed.   Prox Cx lesion is 40% stenosed.   1st Mrg lesion is 100% stenosed.   3rd Mrg lesion is 100% stenosed.   RPAV lesion is 95% stenosed.   A drug-eluting stent was successfully placed using a [place] 2.25X15.   Post intervention, there is a 0% residual stenosis.   LIMA graft was visualized by angiography and is normal in caliber.   SVG graft was visualized by angiography and is normal in caliber.   SVG graft  was visualized by angiography and is normal in caliber.   The graft exhibits no disease.   The graft exhibits no disease.   LV end diastolic pressure is mildly elevated. 3 vessel obstructive CAD, there is a 70% stenosis in the mid LAD which was not grafted Patent LIMA to a large diagonal branch. There is 70% stenosis in the diagonal distal to the graft. Patent SVG to OM1 and 2 Patent SVG to distal RCA. There is a severe stenosis in the PL branch distal to the graft. This appears to be the culprit lesion. Mildly elevated LVEDP 18 mm Hg Successful PCI of the PL branch of the RCA through the vein graft with DES x 1 Severe tortuosity and calcification of the aortoiliac system which made the procedure challenging. Plan: DAPT with ASA and Brilinta for 12 months. Monitor renal function closely. Would treat residual disease in the LAD and diagonal medically. The LAD could be treated with PCI if he has refractory symptoms.   ECHOCARDIOGRAM COMPLETE  Result Date: 04/20/2022    ECHOCARDIOGRAM REPORT   Patient Name:   Tony Lee Date of Exam: 04/20/2022 Medical Rec #:  [id number]       Height:       67.0 in Accession #:    [id number]      Weight:       195.0 lb Date of Birth:  01-08-1931       BSA:          2.001 m Patient Age:    17 years        BP:           180/86 mmHg Patient Gender: M               HR:           53 bpm. Exam Location:  Inpatient Procedure: 2D Echo, Cardiac Doppler and Color Doppler Indications:    Chest pain  History:        Patient has no prior history of Echocardiogram examinations.                 Previous Myocardial Infarction and CAD, Prior CABG;                 Arrythmias:Bradycardia.  Sonographer:    Merrie Roof RDCS Referring Phys: [id number] [place]  1. Left ventricular ejection fraction, by estimation, is 60 to 65%. The left ventricle has normal function. The left ventricle has no regional wall motion abnormalities. There is mild left ventricular hypertrophy of the  basal-septal segment. Left ventricular diastolic parameters are consistent with Grade I diastolic dysfunction (impaired relaxation).  2. Right ventricular systolic function is normal. The right ventricular size is normal.  3. The mitral valve is normal in structure. No  evidence of mitral valve regurgitation. No evidence of mitral stenosis.  4. The aortic valve is tricuspid. Aortic valve regurgitation is trivial. Aortic valve sclerosis/calcification is present, without any evidence of aortic stenosis.  5. The inferior vena cava is normal in size with greater than 50% respiratory variability, suggesting right atrial pressure of 3 mmHg. FINDINGS  Left Ventricle: Left ventricular ejection fraction, by estimation, is 60 to 65%. The left ventricle has normal function. The left ventricle has no regional wall motion abnormalities. The left ventricular internal cavity size was normal in size. There is  mild left ventricular hypertrophy of the basal-septal segment. Left ventricular diastolic parameters are consistent with Grade I diastolic dysfunction (impaired relaxation). Normal left ventricular filling pressure. Right Ventricle: The right ventricular size is normal. No increase in right ventricular wall thickness. Right ventricular systolic function is normal. Left Atrium: Left atrial size was normal in size. Right Atrium: Right atrial size was normal in size. Pericardium: There is no evidence of pericardial effusion. Mitral Valve: The mitral valve is normal in structure. No evidence of mitral valve regurgitation. No evidence of mitral valve stenosis. Tricuspid Valve: The tricuspid valve is normal in structure. Tricuspid valve regurgitation is trivial. No evidence of tricuspid stenosis. Aortic Valve: The aortic valve is tricuspid. Aortic valve regurgitation is trivial. Aortic valve sclerosis/calcification is present, without any evidence of aortic stenosis. Pulmonic Valve: The pulmonic valve was normal in structure.  Pulmonic valve regurgitation is trivial. No evidence of pulmonic stenosis. Aorta: The aortic root is normal in size and structure. Venous: The inferior vena cava is normal in size with greater than 50% respiratory variability, suggesting right atrial pressure of 3 mmHg. IAS/Shunts: No atrial level shunt detected by color flow Doppler.  LEFT VENTRICLE PLAX 2D LVIDd:         4.50 cm   Diastology LVIDs:         3.00 cm   LV e' medial:    6.64 cm/s LV PW:         0.90 cm   LV E/e' medial:  8.5 LV IVS:        0.90 cm   LV e' lateral:   9.14 cm/s LVOT diam:     2.10 cm   LV E/e' lateral: 6.2 LV SV:         73 LV SV Index:   37 LVOT Area:     3.46 cm  RIGHT VENTRICLE RV Basal diam:  2.90 cm RV S prime:     6.60 cm/s TAPSE (M-mode): 1.5 cm LEFT ATRIUM             Index        RIGHT ATRIUM           Index LA diam:        4.30 cm 2.15 cm/m   RA Area:     18.30 cm LA Vol (A2C):   51.7 ml 25.84 ml/m  RA Volume:   48.30 ml  24.14 ml/m LA Vol (A4C):   52.0 ml 25.99 ml/m LA Biplane Vol: 51.9 ml 25.94 ml/m  AORTIC VALVE LVOT Vmax:   94.20 cm/s LVOT Vmean:  59.100 cm/s LVOT VTI:    0.212 m  AORTA Ao Root diam: 3.50 cm Ao Asc diam:  3.10 cm MITRAL VALVE MV Area (PHT): 2.66 cm    SHUNTS MV Decel Time: 285 msec    Systemic VTI:  0.21 m MV E velocity: 56.40 cm/s  Systemic Diam: 2.10 cm MV A velocity:  70.60 cm/s MV E/A ratio:  0.80 Fransico Him MD Electronically signed by Fransico Him MD Signature Date/Time: 04/20/2022/3:00:09 PM    Final    DG Chest 2 View  Result Date: 04/20/2022 CLINICAL DATA:  Chest pain in center of chest pressure radiating to the left arm. EXAM: CHEST - 2 VIEW COMPARISON:  Chest two views 07/24/2007 FINDINGS: Status post median sternotomy and CABG. Cardiac silhouette and mediastinal contours are within normal limits. Mild calcification within the aortic arch. Unchanged calcified benign granuloma within the inferior medial anterior right lower lobe. Mild bibasilar bronchovascular crowding. No pleural  effusion or pneumothorax. Partial visualization of right shoulder arthroplasty. IMPRESSION: No active cardiopulmonary disease. Electronically Signed   By: Yvonne Kendall M.D.   On: 04/20/2022 10:53    Assessment/Plan  1. NSTEMI (non-ST elevated myocardial infarction) ([place]) Followed by cardiology on DAPT x 12 months.  Here in rehab working with therapy.  2. Coronary artery disease involving native coronary artery of native heart without angina pectoris As above, on statin, asa, Brilinta  3. MCI (mild cognitive impairment) Worsened during his hospitalization but slowly returning to baseline.   4. Diabetes mellitus type 2 in obese [facility]) Lab Results  Component Value Date   HGBA1C 8.0 (H) 04/20/2022   Goal <8 due to age  Continue current meds farxiga and tradjenta Recommend diabetic Increase activity as tolerated.    5. Dyslipidemia Lab Results  Component Value Date   LDLCALC 42 04/21/2022   On zocor  and fenofibrate  6. Shuffling gait Using walker Long term issue Has seen neurology Working with therapy   7. Stage 3b chronic kidney disease ([place]) Continue to periodically monitor BMP and avoid nephrotoxic agents  8. HTN BP soft, on low dose losartan   Family/ staff Communication: resident   Labs/tests ordered:  CBC BMP Monday 8/24

## 2022-04-26 NOTE — Telephone Encounter (Signed)
Pt is not interested in the cardiac rehab program per phase I cardiac rehab. Closed referral.

## 2022-04-27 ENCOUNTER — Telehealth: Payer: Self-pay | Admitting: Adult Health

## 2022-04-27 DIAGNOSIS — U071 COVID-19: Secondary | ICD-10-CM

## 2022-04-27 DIAGNOSIS — G3184 Mild cognitive impairment, so stated: Secondary | ICD-10-CM | POA: Diagnosis not present

## 2022-04-27 DIAGNOSIS — I200 Unstable angina: Secondary | ICD-10-CM | POA: Diagnosis not present

## 2022-04-27 DIAGNOSIS — M6389 Disorders of muscle in diseases classified elsewhere, multiple sites: Secondary | ICD-10-CM | POA: Diagnosis not present

## 2022-04-27 DIAGNOSIS — R059 Cough, unspecified: Secondary | ICD-10-CM | POA: Diagnosis not present

## 2022-04-27 DIAGNOSIS — N1832 Chronic kidney disease, stage 3b: Secondary | ICD-10-CM | POA: Diagnosis not present

## 2022-04-27 DIAGNOSIS — R2689 Other abnormalities of gait and mobility: Secondary | ICD-10-CM | POA: Diagnosis not present

## 2022-04-27 DIAGNOSIS — R278 Other lack of coordination: Secondary | ICD-10-CM | POA: Diagnosis not present

## 2022-04-27 MED ORDER — MOLNUPIRAVIR EUA 200MG CAPSULE: 4.0000 | ORAL_CAPSULE | Freq: Two times a day (BID) | ORAL | 0 refills | Status: AC

## 2022-04-27 NOTE — Telephone Encounter (Signed)
Nurse reported cough. Recent hospitalization. Checked Rapid covid which was positive. No sob or fever. Will start molnupiravir due to renal disease and on statin.

## 2022-04-30 ENCOUNTER — Non-Acute Institutional Stay (SKILLED_NURSING_FACILITY): Payer: PPO | Admitting: Internal Medicine

## 2022-04-30 ENCOUNTER — Encounter: Payer: Self-pay | Admitting: Internal Medicine

## 2022-04-30 DIAGNOSIS — U071 COVID-19: Secondary | ICD-10-CM

## 2022-04-30 DIAGNOSIS — N1832 Chronic kidney disease, stage 3b: Secondary | ICD-10-CM | POA: Diagnosis not present

## 2022-04-30 DIAGNOSIS — G3184 Mild cognitive impairment, so stated: Secondary | ICD-10-CM | POA: Diagnosis not present

## 2022-04-30 DIAGNOSIS — R2689 Other abnormalities of gait and mobility: Secondary | ICD-10-CM | POA: Diagnosis not present

## 2022-04-30 DIAGNOSIS — E785 Hyperlipidemia, unspecified: Secondary | ICD-10-CM

## 2022-04-30 DIAGNOSIS — E1169 Type 2 diabetes mellitus with other specified complication: Secondary | ICD-10-CM | POA: Diagnosis not present

## 2022-04-30 DIAGNOSIS — I10 Essential (primary) hypertension: Secondary | ICD-10-CM | POA: Diagnosis not present

## 2022-04-30 DIAGNOSIS — I214 Non-ST elevation (NSTEMI) myocardial infarction: Secondary | ICD-10-CM

## 2022-04-30 DIAGNOSIS — E669 Obesity, unspecified: Secondary | ICD-10-CM | POA: Diagnosis not present

## 2022-04-30 DIAGNOSIS — F419 Anxiety disorder, unspecified: Secondary | ICD-10-CM | POA: Diagnosis not present

## 2022-04-30 LAB — BASIC METABOLIC PANEL
BUN: 21 (ref 4–21)
CO2: 20 (ref 13–22)
Chloride: 101 (ref 99–108)
Creatinine: 1.6 — AB (ref 0.6–1.3)
Glucose: 162
Potassium: 4.2 mEq/L (ref 3.5–5.1)
Sodium: 136 — AB (ref 137–147)

## 2022-04-30 LAB — CBC AND DIFFERENTIAL
HCT: 38 — AB (ref 41–53)
Hemoglobin: 13 — AB (ref 13.5–17.5)
Platelets: 269 10*3/uL (ref 150–400)
WBC: 5

## 2022-04-30 LAB — COMPREHENSIVE METABOLIC PANEL
Calcium: 8.7 (ref 8.7–10.7)
eGFR: 40

## 2022-04-30 LAB — CBC: RBC: 4.01 (ref 3.87–5.11)

## 2022-04-30 MED ORDER — ALPRAZOLAM 0.5 MG PO TABS: 0.5000 mg | ORAL_TABLET | Freq: Every evening | ORAL | 0 refills | Status: DC | PRN

## 2022-04-30 NOTE — Progress Notes (Unsigned)
Provider:  Dr. Veleta Miners Location:  [facility] Room Number: Rehab 154A Place of Service:  SNF (31)  PCP: Tisovec, Fransico Him, MD Patient Care Team: Haywood Pao, MD as PCP - General (Internal Medicine) Martinique, Peter M, MD as PCP - Cardiology (Cardiology) Tat, Eustace Quail, DO as Attending Physician (Neurology) Martinique, Peter M, MD as Attending Physician (Cardiology) Nobie Putnam, MD (Hematology and Oncology) Clarene Essex, MD as Attending Physician (Gastroenterology)  Extended Emergency Contact Information Primary Emergency Contact: Tony Lee Address: [address]          [place], [place] [address] Tony Lee of [place] Phone: [phone] Mobile Phone: [phone] Relation: Spouse Secondary Emergency Contact: Tony Lee Mobile Phone: [phone] Relation: Son  Code Status: DNR Goals of Care: Advanced Directive information    04/20/2022   10:14 AM  Advanced Directives  Does Patient Have a Medical Advance Directive? No  Would patient like information on creating a medical advance directive? No - Patient declined      Chief Complaint  Patient presents with   New Admit To SNF    New Admit to Rehab.     HPI: Patient is a 88 y.o. male seen today for admission to Rehab  Admitted in the hospital for Chest pain and Diagnosed with NSTEMI    Past Medical History:  Diagnosis Date   Cancer ([place]) 09/10/2002   prostate   Chronic kidney disease, stage 3b ([place])    Coronary artery disease    Depression    Diabetes mellitus type 2 in obese ([place])    Dyslipidemia    Esophageal stricture    Monoclonal paraproteinemia 03/03/2013   Palpitations    RBBB (right bundle branch block)    Past Surgical History:  Procedure Laterality Date   ANKLE SURGERY Left    APPENDECTOMY     CARDIAC CATHETERIZATION     Dr. Martinique   CATARACT EXTRACTION W/ INTRAOCULAR LENS  IMPLANT, BILATERAL     CORONARY ARTERY BYPASS GRAFT  Oct. 2008   x  5,Dr.Gerhardt   CORONARY/GRAFT ACUTE MI REVASCULARIZATION N/A 04/22/2022   Procedure: Coronary/Graft Acute MI Revascularization;  Surgeon: Martinique, Peter M, MD;  Location: [facility];  Service: Cardiovascular;  Laterality: N/A;   HERNIA REPAIR Bilateral    LEFT HEART CATH AND CORONARY ANGIOGRAPHY N/A 04/22/2022   Procedure: LEFT HEART CATH AND CORONARY ANGIOGRAPHY;  Surgeon: Martinique, Peter M, MD;  Location: [facility];  Service: Cardiovascular;  Laterality: N/A;   PROSTATECTOMY  2004   radical   right shoulder      replacement   vein strippping      reports that he quit smoking about 54 years ago. His smoking use included cigarettes. He has a 10.00 pack-year smoking history. He has never used smokeless tobacco. He reports current alcohol use. He reports that he does not use drugs. Social History   Socioeconomic History   Marital status: Married    Spouse name: Tony Lee   Number of children: 3   Years of education: Not on file   Highest education level: Bachelor's degree (e.g., BA, AB, BS)  Occupational History   Occupation: printing-retired  Tobacco Use   Smoking status: Former    Packs/day: 0.50    Years: 20.00    Total pack years: 10.00    Types: Cigarettes    Quit date: 09/11/1967    Years since quitting: 54.6   Smokeless tobacco: Never   Tobacco comments:    quit  45 years ago  Substance and Sexual Activity   Alcohol use: Yes    Comment: social, 1-2 times per week    Drug use: No   Sexual activity: Not on file  Other Topics Concern   Not on file  Social History Narrative   Lives with wife at [place] Strain: Not on file  Food Insecurity: Not on file  Transportation Needs: Not on file  Physical Activity: Not on file  Stress: Not on file  Social Connections: Not on file  Intimate Partner Violence: Not on file    Functional Status Survey:    Family History  Problem Relation Age of Onset   Heart  failure Father        age 88   Heart attack Brother        age 11 post third open heart surgery   Heart attack Brother     Health Maintenance  Topic Date Due   FOOT EXAM  Never done   OPHTHALMOLOGY EXAM  Never done   Pneumonia Vaccine 54+ Years old (1 - PCV) 11/25/1997   Zoster Vaccines- Shingrix (2 of 2) 03/17/2013   COVID-19 Vaccine (5 - Moderna risk series) 08/22/2021   INFLUENZA VACCINE  04/10/2022   HEMOGLOBIN A1C  10/21/2022   TETANUS/TDAP  07/30/2023   HPV VACCINES  Aged Out    Allergies  Allergen Reactions   Other Itching     Mepergan Fortis, (meprozine)  = itching (patient disputes this in 2023)    Outpatient Encounter Medications as of 04/30/2022  Medication Sig   aspirin EC 81 MG tablet Take 1 tablet (81 mg total) by mouth daily. Swallow whole.   Black Pepper-Turmeric 3-500 MG CAPS Take 1 capsule by mouth every Monday, Wednesday, and Friday.   Cyanocobalamin (VITAMIN B-12 PO) Take 5,000 mcg by mouth at bedtime.   diclofenac Sodium (VOLTAREN) 1 % GEL Apply 4 g topically See admin instructions. Apply to affected area of right wrist 4 times a day   FARXIGA 5 MG TABS tablet Take 5 mg by mouth daily.   fenofibrate 160 MG tablet Take 1 tablet (160 mg total) by mouth daily.   guaiFENesin (ROBITUSSIN) 100 MG/5ML liquid Take 5 mLs by mouth every 6 (six) hours as needed for cough or to loosen phlegm.   losartan (COZAAR) 25 MG tablet Take 1 tablet (25 mg total) by mouth daily.   molnupiravir EUA (LAGEVRIO) 200 mg CAPS capsule Take 4 capsules (800 mg total) by mouth 2 (two) times daily for 5 days.   nitroGLYCERIN (NITROSTAT) 0.4 MG SL tablet PLACE 1 TABLET (0.4 MG TOTAL) UNDER THE TONGUE EVERY 5 (FIVE) MINUTES AS NEEDED.   omeprazole (PRILOSEC) 40 MG capsule Take 40 mg by mouth daily before breakfast.   polyethylene glycol powder (GLYCOLAX/MIRALAX) 17 GM/SCOOP powder Take 17 g by mouth at bedtime as needed for mild constipation.   simvastatin (ZOCOR) 20 MG tablet Take 20 mg by  mouth at bedtime.     ticagrelor (BRILINTA) 90 MG TABS tablet Take 1 tablet (90 mg total) by mouth 2 (two) times daily.   TRADJENTA 5 MG TABS tablet Take 5 mg by mouth daily.   ZYRTEC ALLERGY 10 MG tablet Take 10 mg by mouth daily as needed for allergies.   No facility-administered encounter medications on file as of 04/30/2022.    Review of Systems  Vitals:   04/30/22 0946  BP: (!) 108/46  Pulse: 82  Resp: 20  Temp: 98.8 F (37.1 C)  TempSrc: Skin  SpO2: 97%  Weight: 176 lb 9.6 oz (80.1 kg)  Height: '5\' 4"'$  (1.626 m)   Body mass index is 30.31 kg/m. Physical Exam  Labs reviewed: Basic Metabolic Panel: Recent Labs    04/22/22 1022 04/22/22 1839 04/22/22 2042 04/22/22 2043 04/23/22 1025  NA 135   < > 134* 137 136  K 4.4   < > 4.0 4.1 4.1  CL 105  --  107 103 105  CO2 23  --  19*  --  23  GLUCOSE 173*  --  192* 198* 197*  BUN 26*  --  25* 25* 26*  CREATININE 1.59*  --  1.67* 1.80* 1.81*  CALCIUM 9.1  --  8.9  --  9.0  MG  --   --  1.8  --   --    < > = values in this interval not displayed.   Liver Function Tests: Recent Labs    04/21/22 0308  AST 13*  ALT 11  ALKPHOS 39  BILITOT 0.6  PROT 5.9*  ALBUMIN 3.1*   No results for input(s): "LIPASE", "AMYLASE" in the last 8760 hours. No results for input(s): "AMMONIA" in the last 8760 hours. CBC: Recent Labs    04/22/22 0711 04/22/22 1839 04/22/22 2043 04/22/22 2224 04/23/22 1025  WBC 10.0  --   --  9.8 7.1  HGB 14.2   < > 12.9* 13.1 13.5  HCT 41.0   < > 38.0* 37.7* 39.1  MCV 94.0  --   --  93.5 94.7  PLT 245  --   --  238 208   < > = values in this interval not displayed.   Cardiac Enzymes: No results for input(s): "CKTOTAL", "CKMB", "CKMBINDEX", "TROPONINI" in the last 8760 hours. BNP: Invalid input(s): "POCBNP" Lab Results  Component Value Date   HGBA1C 8.0 (H) 04/20/2022   Lab Results  Component Value Date   TSH 3.233 04/21/2022   Lab Results  Component Value Date   VITAMINB12 188 (L)  01/29/2013   Lab Results  Component Value Date   FOLATE 18.6 01/29/2013   No results found for: "IRON", "TIBC", "FERRITIN"  Imaging and Procedures obtained prior to SNF admission: ECHOCARDIOGRAM COMPLETE  Result Date: 04/20/2022    ECHOCARDIOGRAM REPORT   Patient Name:   Tony Lee Date of Exam: 04/20/2022 Medical Rec #:  [id number]       Height:       67.0 in Accession #:    [id number]      Weight:       195.0 lb Date of Birth:  1931-03-22       BSA:          2.001 m Patient Age:    71 years        BP:           180/86 mmHg Patient Gender: M               HR:           53 bpm. Exam Location:  Inpatient Procedure: 2D Echo, Cardiac Doppler and Color Doppler Indications:    Chest pain  History:        Patient has no prior history of Echocardiogram examinations.                 Previous Myocardial Infarction and CAD, Prior CABG;  Arrythmias:Bradycardia.  Sonographer:    Merrie Roof RDCS Referring Phys: [id number] [place]  1. Left ventricular ejection fraction, by estimation, is 60 to 65%. The left ventricle has normal function. The left ventricle has no regional wall motion abnormalities. There is mild left ventricular hypertrophy of the basal-septal segment. Left ventricular diastolic parameters are consistent with Grade I diastolic dysfunction (impaired relaxation).  2. Right ventricular systolic function is normal. The right ventricular size is normal.  3. The mitral valve is normal in structure. No evidence of mitral valve regurgitation. No evidence of mitral stenosis.  4. The aortic valve is tricuspid. Aortic valve regurgitation is trivial. Aortic valve sclerosis/calcification is present, without any evidence of aortic stenosis.  5. The inferior vena cava is normal in size with greater than 50% respiratory variability, suggesting right atrial pressure of 3 mmHg. FINDINGS  Left Ventricle: Left ventricular ejection fraction, by estimation, is 60 to 65%. The left ventricle  has normal function. The left ventricle has no regional wall motion abnormalities. The left ventricular internal cavity size was normal in size. There is  mild left ventricular hypertrophy of the basal-septal segment. Left ventricular diastolic parameters are consistent with Grade I diastolic dysfunction (impaired relaxation). Normal left ventricular filling pressure. Right Ventricle: The right ventricular size is normal. No increase in right ventricular wall thickness. Right ventricular systolic function is normal. Left Atrium: Left atrial size was normal in size. Right Atrium: Right atrial size was normal in size. Pericardium: There is no evidence of pericardial effusion. Mitral Valve: The mitral valve is normal in structure. No evidence of mitral valve regurgitation. No evidence of mitral valve stenosis. Tricuspid Valve: The tricuspid valve is normal in structure. Tricuspid valve regurgitation is trivial. No evidence of tricuspid stenosis. Aortic Valve: The aortic valve is tricuspid. Aortic valve regurgitation is trivial. Aortic valve sclerosis/calcification is present, without any evidence of aortic stenosis. Pulmonic Valve: The pulmonic valve was normal in structure. Pulmonic valve regurgitation is trivial. No evidence of pulmonic stenosis. Aorta: The aortic root is normal in size and structure. Venous: The inferior vena cava is normal in size with greater than 50% respiratory variability, suggesting right atrial pressure of 3 mmHg. IAS/Shunts: No atrial level shunt detected by color flow Doppler.  LEFT VENTRICLE PLAX 2D LVIDd:         4.50 cm   Diastology LVIDs:         3.00 cm   LV e' medial:    6.64 cm/s LV PW:         0.90 cm   LV E/e' medial:  8.5 LV IVS:        0.90 cm   LV e' lateral:   9.14 cm/s LVOT diam:     2.10 cm   LV E/e' lateral: 6.2 LV SV:         73 LV SV Index:   37 LVOT Area:     3.46 cm  RIGHT VENTRICLE RV Basal diam:  2.90 cm RV S prime:     6.60 cm/s TAPSE (M-mode): 1.5 cm LEFT ATRIUM              Index        RIGHT ATRIUM           Index LA diam:        4.30 cm 2.15 cm/m   RA Area:     18.30 cm LA Vol (A2C):   51.7 ml 25.84 ml/m  RA Volume:   48.30  ml  24.14 ml/m LA Vol (A4C):   52.0 ml 25.99 ml/m LA Biplane Vol: 51.9 ml 25.94 ml/m  AORTIC VALVE LVOT Vmax:   94.20 cm/s LVOT Vmean:  59.100 cm/s LVOT VTI:    0.212 m  AORTA Ao Root diam: 3.50 cm Ao Asc diam:  3.10 cm MITRAL VALVE MV Area (PHT): 2.66 cm    SHUNTS MV Decel Time: 285 msec    Systemic VTI:  0.21 m MV E velocity: 56.40 cm/s  Systemic Diam: 2.10 cm MV A velocity: 70.60 cm/s MV E/A ratio:  0.80 Fransico Him MD Electronically signed by Fransico Him MD Signature Date/Time: 04/20/2022/3:00:09 PM    Final    DG Chest 2 View  Result Date: 04/20/2022 CLINICAL DATA:  Chest pain in center of chest pressure radiating to the left arm. EXAM: CHEST - 2 VIEW COMPARISON:  Chest two views 07/24/2007 FINDINGS: Status post median sternotomy and CABG. Cardiac silhouette and mediastinal contours are within normal limits. Mild calcification within the aortic arch. Unchanged calcified benign granuloma within the inferior medial anterior right lower lobe. Mild bibasilar bronchovascular crowding. No pleural effusion or pneumothorax. Partial visualization of right shoulder arthroplasty. IMPRESSION: No active cardiopulmonary disease. Electronically Signed   By: Yvonne Kendall M.D.   On: 04/20/2022 10:53    Assessment/Plan  1. COVID-19 virus infection   2. NSTEMI (non-ST elevated myocardial infarction) (HCC) ***  3. Anxiety ***  4. MCI (mild cognitive impairment) ***  5. Diabetes mellitus type 2 in obese (HCC) ***  6. Dyslipidemia ***  7. Shuffling gait ***  8. Stage 3b chronic kidney disease ([facility]CC) Creat 1.63   Family/ staff Communication:   Labs/tests ordered:

## 2022-05-02 ENCOUNTER — Encounter: Payer: Self-pay | Admitting: Internal Medicine

## 2022-05-02 DIAGNOSIS — I200 Unstable angina: Secondary | ICD-10-CM | POA: Diagnosis not present

## 2022-05-02 DIAGNOSIS — M6389 Disorders of muscle in diseases classified elsewhere, multiple sites: Secondary | ICD-10-CM | POA: Diagnosis not present

## 2022-05-02 DIAGNOSIS — R278 Other lack of coordination: Secondary | ICD-10-CM | POA: Diagnosis not present

## 2022-05-02 DIAGNOSIS — N1832 Chronic kidney disease, stage 3b: Secondary | ICD-10-CM | POA: Diagnosis not present

## 2022-05-02 DIAGNOSIS — R2689 Other abnormalities of gait and mobility: Secondary | ICD-10-CM | POA: Diagnosis not present

## 2022-05-02 DIAGNOSIS — G3184 Mild cognitive impairment, so stated: Secondary | ICD-10-CM | POA: Diagnosis not present

## 2022-05-03 DIAGNOSIS — I200 Unstable angina: Secondary | ICD-10-CM | POA: Diagnosis not present

## 2022-05-03 DIAGNOSIS — R2689 Other abnormalities of gait and mobility: Secondary | ICD-10-CM | POA: Diagnosis not present

## 2022-05-03 DIAGNOSIS — M6389 Disorders of muscle in diseases classified elsewhere, multiple sites: Secondary | ICD-10-CM | POA: Diagnosis not present

## 2022-05-03 DIAGNOSIS — R278 Other lack of coordination: Secondary | ICD-10-CM | POA: Diagnosis not present

## 2022-05-03 DIAGNOSIS — N1832 Chronic kidney disease, stage 3b: Secondary | ICD-10-CM | POA: Diagnosis not present

## 2022-05-03 DIAGNOSIS — G3184 Mild cognitive impairment, so stated: Secondary | ICD-10-CM | POA: Diagnosis not present

## 2022-05-04 DIAGNOSIS — I200 Unstable angina: Secondary | ICD-10-CM | POA: Diagnosis not present

## 2022-05-04 DIAGNOSIS — M6389 Disorders of muscle in diseases classified elsewhere, multiple sites: Secondary | ICD-10-CM | POA: Diagnosis not present

## 2022-05-04 DIAGNOSIS — R278 Other lack of coordination: Secondary | ICD-10-CM | POA: Diagnosis not present

## 2022-05-04 DIAGNOSIS — G3184 Mild cognitive impairment, so stated: Secondary | ICD-10-CM | POA: Diagnosis not present

## 2022-05-04 DIAGNOSIS — R2689 Other abnormalities of gait and mobility: Secondary | ICD-10-CM | POA: Diagnosis not present

## 2022-05-04 DIAGNOSIS — N1832 Chronic kidney disease, stage 3b: Secondary | ICD-10-CM | POA: Diagnosis not present

## 2022-05-07 DIAGNOSIS — I10 Essential (primary) hypertension: Secondary | ICD-10-CM | POA: Diagnosis not present

## 2022-05-07 DIAGNOSIS — M6389 Disorders of muscle in diseases classified elsewhere, multiple sites: Secondary | ICD-10-CM | POA: Diagnosis not present

## 2022-05-07 DIAGNOSIS — R278 Other lack of coordination: Secondary | ICD-10-CM | POA: Diagnosis not present

## 2022-05-07 DIAGNOSIS — R2689 Other abnormalities of gait and mobility: Secondary | ICD-10-CM | POA: Diagnosis not present

## 2022-05-07 DIAGNOSIS — I200 Unstable angina: Secondary | ICD-10-CM | POA: Diagnosis not present

## 2022-05-07 DIAGNOSIS — G3184 Mild cognitive impairment, so stated: Secondary | ICD-10-CM | POA: Diagnosis not present

## 2022-05-07 DIAGNOSIS — F411 Generalized anxiety disorder: Secondary | ICD-10-CM | POA: Diagnosis not present

## 2022-05-07 DIAGNOSIS — R41841 Cognitive communication deficit: Secondary | ICD-10-CM | POA: Diagnosis not present

## 2022-05-07 DIAGNOSIS — N1832 Chronic kidney disease, stage 3b: Secondary | ICD-10-CM | POA: Diagnosis not present

## 2022-05-08 ENCOUNTER — Encounter: Payer: Self-pay | Admitting: Orthopedic Surgery

## 2022-05-08 ENCOUNTER — Non-Acute Institutional Stay (SKILLED_NURSING_FACILITY): Payer: PPO | Admitting: Orthopedic Surgery

## 2022-05-08 DIAGNOSIS — I200 Unstable angina: Secondary | ICD-10-CM | POA: Diagnosis not present

## 2022-05-08 DIAGNOSIS — E785 Hyperlipidemia, unspecified: Secondary | ICD-10-CM

## 2022-05-08 DIAGNOSIS — U071 COVID-19: Secondary | ICD-10-CM | POA: Diagnosis not present

## 2022-05-08 DIAGNOSIS — G3184 Mild cognitive impairment, so stated: Secondary | ICD-10-CM

## 2022-05-08 DIAGNOSIS — N1832 Chronic kidney disease, stage 3b: Secondary | ICD-10-CM | POA: Diagnosis not present

## 2022-05-08 DIAGNOSIS — E669 Obesity, unspecified: Secondary | ICD-10-CM

## 2022-05-08 DIAGNOSIS — F411 Generalized anxiety disorder: Secondary | ICD-10-CM | POA: Diagnosis not present

## 2022-05-08 DIAGNOSIS — R2689 Other abnormalities of gait and mobility: Secondary | ICD-10-CM

## 2022-05-08 DIAGNOSIS — I214 Non-ST elevation (NSTEMI) myocardial infarction: Secondary | ICD-10-CM | POA: Diagnosis not present

## 2022-05-08 DIAGNOSIS — M6389 Disorders of muscle in diseases classified elsewhere, multiple sites: Secondary | ICD-10-CM | POA: Diagnosis not present

## 2022-05-08 DIAGNOSIS — F419 Anxiety disorder, unspecified: Secondary | ICD-10-CM

## 2022-05-08 DIAGNOSIS — E1169 Type 2 diabetes mellitus with other specified complication: Secondary | ICD-10-CM

## 2022-05-08 DIAGNOSIS — R278 Other lack of coordination: Secondary | ICD-10-CM | POA: Diagnosis not present

## 2022-05-08 DIAGNOSIS — R41841 Cognitive communication deficit: Secondary | ICD-10-CM | POA: Diagnosis not present

## 2022-05-08 DIAGNOSIS — I10 Essential (primary) hypertension: Secondary | ICD-10-CM | POA: Diagnosis not present

## 2022-05-08 NOTE — Progress Notes (Signed)
Location:   [facility] Room Number: 154-A Place of Service:  SNF ([phone])  Provider: Windell Moulding, NP  PCP: Haywood Pao, MD Patient Care Team: Haywood Pao, MD as PCP - General (Internal Medicine) Martinique, Peter M, MD as PCP - Cardiology (Cardiology) Tat, Eustace Quail, DO as Attending Physician (Neurology) Martinique, Peter M, MD as Attending Physician (Cardiology) Nobie Putnam, MD (Hematology and Oncology) Clarene Essex, MD as Attending Physician (Gastroenterology)  Extended Emergency Contact Information Primary Emergency Contact: [name] Address: [address]          [place], [place] [address] Johnnette Litter of [place] Phone: [phone] Mobile Phone: [phone] Relation: Spouse Secondary Emergency Contact: [name],doug Mobile Phone: [phone] Relation: Son  Code Status: DNR Goals of care:  Advanced Directive information    05/08/2022   12:46 PM  Advanced Directives  Does Patient Have a Medical Advance Directive? No  Would patient like information on creating a medical advance directive? No - Patient declined     Allergies  Allergen Reactions   Other Itching     Mepergan Fortis, (meprozine)  = itching (patient disputes this in 2023)    Chief Complaint  Patient presents with   Discharge Note    Discharge from SNF    HPI:  88 y.o. male  seen today for discharge evaluation.   Hospitalized 08/11-08/14 due to chest pain, diagnosed with NSTEMI. Troponin 1042. He was placed on nitro and heparin gtt. 08/13 he underwent left heart cath- showed 3 vessel obstructive CAD with 70% stenosis of LAD. Severe stenosis of PL branch distal to graft which was culprit lesion. Successful PCI of the PL branch of the RCA with DES x 1. Cardiology recommended DAPT with asa and Brilinta x 12 months. LAD treated medically. May need further PCI for refractory symptoms. 2D echo LVEF 40-34%, grade 1 diastolic dysfunction. Beta blocker stopped due to  RBBB, low blood pressure. He was discharged to skilled rehab for PT/OT. 08/18 he tested positive for covid 19. He was prescribed molnupiravir.   Today, he denies chest pain, sob, palpitations. He also denies long term covid symptoms. Ambulating with walker. No recent falls. Eating 2 meals daily. Drinking fluids well. Requesting to discharge back to [place]. Wife agreeable with plan. Discussed follow up with PCP in 2 weeks. Continue scheduled cardiology follow ups.     Past Medical History:  Diagnosis Date   Cancer ([facility]) 09/10/2002   prostate   Chronic kidney disease, stage 3b (HCC)    Coronary artery disease    Depression    Diabetes mellitus type 2 in obese (HCC)    Dyslipidemia    Esophageal stricture    Monoclonal paraproteinemia 03/03/2013   Palpitations    RBBB (right bundle branch block)     Past Surgical History:  Procedure Laterality Date   ANKLE SURGERY Left    APPENDECTOMY     CARDIAC CATHETERIZATION     Dr. Martinique   CATARACT EXTRACTION W/ INTRAOCULAR LENS  IMPLANT, BILATERAL     CORONARY ARTERY BYPASS GRAFT  Oct. 2008   x 5,Dr.Gerhardt   CORONARY/GRAFT ACUTE MI REVASCULARIZATION N/A 04/22/2022   Procedure: Coronary/Graft Acute MI Revascularization;  Surgeon: Martinique, Peter M, MD;  Location: [facility];  Service: Cardiovascular;  Laterality: N/A;   HERNIA REPAIR Bilateral    LEFT HEART CATH AND CORONARY ANGIOGRAPHY N/A 04/22/2022   Procedure: LEFT HEART CATH AND CORONARY ANGIOGRAPHY;  Surgeon: Martinique, Peter M, MD;  Location: [facility];  Service: Cardiovascular;  Laterality: N/A;   PROSTATECTOMY  2004   radical   right shoulder      replacement   vein strippping        reports that he quit smoking about 54 years ago. His smoking use included cigarettes. He has a 10.00 pack-year smoking history. He has never used smokeless tobacco. He reports current alcohol use. He reports that he does not use drugs. Social History   Socioeconomic History   Marital  status: Married    Spouse name: [name]   Number of children: 3   Years of education: Not on file   Highest education level: Bachelor's degree (e.g., BA, AB, BS)  Occupational History   Occupation: printing-retired  Tobacco Use   Smoking status: Former    Packs/day: 0.50    Years: 20.00    Total pack years: 10.00    Types: Cigarettes    Quit date: 09/11/1967    Years since quitting: 54.6   Smokeless tobacco: Never   Tobacco comments:    quit 45 years ago  Substance and Sexual Activity   Alcohol use: Yes    Comment: social, 1-2 times per week    Drug use: No   Sexual activity: Not on file  Other Topics Concern   Not on file  Social History Narrative   Lives with wife at [facility] Determinants of Health   Financial Resource Strain: Not on file  Food Insecurity: Not on file  Transportation Needs: Not on file  Physical Activity: Not on file  Stress: Not on file  Social Connections: Not on file  Intimate Partner Violence: Not on file   Functional Status Survey:    Allergies  Allergen Reactions   Other Itching     Mepergan Fortis, (meprozine)  = itching (patient disputes this in 2023)    Pertinent  Health Maintenance Due  Topic Date Due   FOOT EXAM  Never done   OPHTHALMOLOGY EXAM  Never done   INFLUENZA VACCINE  04/10/2022   HEMOGLOBIN A1C  10/21/2022    Medications: Allergies as of 05/08/2022       Reactions   Other Itching    Mepergan Fortis, (meprozine)  = itching (patient disputes this in 2023)        Medication List        Accurate as of May 08, 2022 12:46 PM. If you have any questions, ask your nurse or doctor.          STOP taking these medications    guaiFENesin 100 MG/5ML liquid Commonly known as: ROBITUSSIN Stopped by: Yvonna Alanis, NP       TAKE these medications    ALPRAZolam 0.5 MG tablet Commonly known as: XANAX Take 1 tablet (0.5 mg total) by mouth at bedtime as needed for anxiety.   aspirin EC 81 MG  tablet Take 1 tablet (81 mg total) by mouth daily. Swallow whole.   Black Pepper-Turmeric 3-500 MG Caps Take 1 capsule by mouth every Monday, Wednesday, and Friday.   Farxiga 5 MG Tabs tablet Generic drug: dapagliflozin propanediol Take 5 mg by mouth daily.   fenofibrate 160 MG tablet Take 1 tablet (160 mg total) by mouth daily.   losartan 25 MG tablet Commonly known as: COZAAR Take 1 tablet (25 mg total) by mouth daily.   nitroGLYCERIN 0.4 MG SL tablet Commonly known as: Nitrostat PLACE 1 TABLET (0.4 MG TOTAL) UNDER THE TONGUE EVERY 5 (FIVE) MINUTES AS  NEEDED.   omeprazole 40 MG capsule Commonly known as: PRILOSEC Take 40 mg by mouth daily before breakfast.   polyethylene glycol powder 17 GM/SCOOP powder Commonly known as: GLYCOLAX/MIRALAX Take 17 g by mouth at bedtime as needed for mild constipation.   simvastatin 20 MG tablet Commonly known as: ZOCOR Take 20 mg by mouth at bedtime.   ticagrelor 90 MG Tabs tablet Commonly known as: BRILINTA Take 1 tablet (90 mg total) by mouth 2 (two) times daily.   Tradjenta 5 MG Tabs tablet Generic drug: linagliptin Take 5 mg by mouth daily.   VITAMIN B-12 PO Take 5,000 mcg by mouth at bedtime.   Voltaren 1 % Gel Generic drug: diclofenac Sodium Apply 4 g topically See admin instructions. Apply to affected area of right wrist 4 times a day   ZyrTEC Allergy 10 MG tablet Generic drug: cetirizine Take 10 mg by mouth daily as needed for allergies.        Review of Systems  Constitutional:  Negative for activity change, appetite change, chills, fatigue and fever.  HENT:  Negative for congestion, sore throat and trouble swallowing.   Eyes:  Negative for visual disturbance.  Respiratory:  Negative for cough, shortness of breath and wheezing.   Cardiovascular:  Negative for chest pain and leg swelling.  Gastrointestinal:  Negative for abdominal distention, abdominal pain, constipation, diarrhea, nausea and vomiting.   Genitourinary:  Negative for dysuria, frequency and hematuria.  Musculoskeletal:  Positive for gait problem.  Skin:  Negative for wound.  Neurological:  Positive for weakness. Negative for dizziness and headaches.  Psychiatric/Behavioral:  Positive for confusion. Negative for dysphoric mood and sleep disturbance. The patient is nervous/anxious.     Vitals:   05/08/22 1226  BP: 138/79  Pulse: 85  Resp: 18  Temp: (!) 97.2 F (36.2 C)  SpO2: 97%  Weight: 176 lb 9.6 oz (80.1 kg)  Height: '5\' 4"'$  (1.626 Lee)   Body mass index is 30.31 kg/Lee. Physical Exam Vitals reviewed.  Constitutional:      General: He is not in acute distress. HENT:     Head: Normocephalic.     Right Ear: There is no impacted cerumen.     Left Ear: There is no impacted cerumen.     Nose: Nose normal.     Mouth/Throat:     Mouth: Mucous membranes are moist.  Eyes:     General:        Right eye: No discharge.        Left eye: No discharge.  Cardiovascular:     Rate and Rhythm: Normal rate and regular rhythm.     Pulses: Normal pulses.     Heart sounds: Normal heart sounds.  Pulmonary:     Effort: Pulmonary effort is normal. No respiratory distress.     Breath sounds: Normal breath sounds. No wheezing or rales.  Abdominal:     General: Bowel sounds are normal. There is no distension.     Palpations: Abdomen is soft.     Tenderness: There is no abdominal tenderness.  Musculoskeletal:     Cervical back: Neck supple.     Right lower leg: No edema.     Left lower leg: No edema.  Skin:    General: Skin is warm and dry.     Capillary Refill: Capillary refill takes less than 2 seconds.  Neurological:     General: No focal deficit present.     Mental Status: He is alert. Mental status is  at baseline.     Motor: Weakness present.     Gait: Gait abnormal.     Comments: walker  Psychiatric:        Mood and Affect: Mood normal.        Behavior: Behavior normal.     Labs reviewed: Basic Metabolic  Panel: Recent Labs    04/22/22 2042 04/22/22 2043 04/23/22 1025 04/30/22 0000  NA 134* 137 136 136*  K 4.0 4.1 4.1 4.2  CL 107 103 105 101  CO2 19*  --  23 20  GLUCOSE 192* 198* 197*  --   BUN 25* 25* 26* 21  CREATININE 1.67* 1.80* 1.81* 1.6*  CALCIUM 8.9  --  9.0 8.7  MG 1.8  --   --   --    Liver Function Tests: Recent Labs    04/21/22 0308  AST 13*  ALT 11  ALKPHOS 39  BILITOT 0.6  PROT 5.9*  ALBUMIN 3.1*   No results for input(s): "LIPASE", "AMYLASE" in the last 8760 hours. No results for input(s): "AMMONIA" in the last 8760 hours. CBC: Recent Labs    04/22/22 0711 04/22/22 1839 04/22/22 2224 04/23/22 1025 04/30/22 0000  WBC 10.0  --  9.8 7.1 5.0  HGB 14.2   < > 13.1 13.5 13.0*  HCT 41.0   < > 37.7* 39.1 38*  MCV 94.0  --  93.5 94.7  --   PLT 245  --  238 208 269   < > = values in this interval not displayed.   Cardiac Enzymes: No results for input(s): "CKTOTAL", "CKMB", "CKMBINDEX", "TROPONINI" in the last 8760 hours. BNP: Invalid input(s): "POCBNP" CBG: Recent Labs    04/23/22 0620 04/23/22 1111 04/23/22 1603  GLUCAP 194* 217* 227*    Procedures and Imaging Studies During Stay: CARDIAC CATHETERIZATION  Result Date: 04/22/2022   Prox LAD to Mid LAD lesion is 70% stenosed.   1st Diag-1 lesion is 100% stenosed.   1st Diag-2 lesion is 70% stenosed.   Prox RCA lesion is 95% stenosed.   Mid RCA lesion is 100% stenosed.   Prox Cx lesion is 40% stenosed.   1st Mrg lesion is 100% stenosed.   3rd Mrg lesion is 100% stenosed.   RPAV lesion is 95% stenosed.   A drug-eluting stent was successfully placed using a [place] 2.25X15.   Post intervention, there is a 0% residual stenosis.   LIMA graft was visualized by angiography and is normal in caliber.   SVG graft was visualized by angiography and is normal in caliber.   SVG graft was visualized by angiography and is normal in caliber.   The graft exhibits no disease.   The graft exhibits no disease.    LV end diastolic pressure is mildly elevated. 3 vessel obstructive CAD, there is a 70% stenosis in the mid LAD which was not grafted Patent LIMA to a large diagonal branch. There is 70% stenosis in the diagonal distal to the graft. Patent SVG to OM1 and 2 Patent SVG to distal RCA. There is a severe stenosis in the PL branch distal to the graft. This appears to be the culprit lesion. Mildly elevated LVEDP 18 mm Hg Successful PCI of the PL branch of the RCA through the vein graft with DES x 1 Severe tortuosity and calcification of the aortoiliac system which made the procedure challenging. Plan: DAPT with ASA and Brilinta for 12 months. Monitor renal function closely. Would treat residual disease in the LAD  and diagonal medically. The LAD could be treated with PCI if he has refractory symptoms.   ECHOCARDIOGRAM COMPLETE  Result Date: 04/20/2022    ECHOCARDIOGRAM REPORT   Patient Name:   Tony Lee Date of Exam: 04/20/2022 Medical Rec #:  [id number]       Height:       67.0 in Accession #:    [id number]      Weight:       195.0 lb Date of Birth:  1930/11/01       BSA:          2.001 Lee Patient Age:    89 years        BP:           180/86 mmHg Patient Gender: Lee               HR:           53 bpm. Exam Location:  Inpatient Procedure: 2D Echo, Cardiac Doppler and Color Doppler Indications:    Chest pain  History:        Patient has no prior history of Echocardiogram examinations.                 Previous Myocardial Infarction and CAD, Prior CABG;                 Arrythmias:Bradycardia.  Sonographer:    Merrie Roof RDCS Referring Phys: [id number] Day  1. Left ventricular ejection fraction, by estimation, is 60 to 65%. The left ventricle has normal function. The left ventricle has no regional wall motion abnormalities. There is mild left ventricular hypertrophy of the basal-septal segment. Left ventricular diastolic parameters are consistent with Grade I diastolic dysfunction (impaired relaxation).   2. Right ventricular systolic function is normal. The right ventricular size is normal.  3. The mitral valve is normal in structure. No evidence of mitral valve regurgitation. No evidence of mitral stenosis.  4. The aortic valve is tricuspid. Aortic valve regurgitation is trivial. Aortic valve sclerosis/calcification is present, without any evidence of aortic stenosis.  5. The inferior vena cava is normal in size with greater than 50% respiratory variability, suggesting right atrial pressure of 3 mmHg. FINDINGS  Left Ventricle: Left ventricular ejection fraction, by estimation, is 60 to 65%. The left ventricle has normal function. The left ventricle has no regional wall motion abnormalities. The left ventricular internal cavity size was normal in size. There is  mild left ventricular hypertrophy of the basal-septal segment. Left ventricular diastolic parameters are consistent with Grade I diastolic dysfunction (impaired relaxation). Normal left ventricular filling pressure. Right Ventricle: The right ventricular size is normal. No increase in right ventricular wall thickness. Right ventricular systolic function is normal. Left Atrium: Left atrial size was normal in size. Right Atrium: Right atrial size was normal in size. Pericardium: There is no evidence of pericardial effusion. Mitral Valve: The mitral valve is normal in structure. No evidence of mitral valve regurgitation. No evidence of mitral valve stenosis. Tricuspid Valve: The tricuspid valve is normal in structure. Tricuspid valve regurgitation is trivial. No evidence of tricuspid stenosis. Aortic Valve: The aortic valve is tricuspid. Aortic valve regurgitation is trivial. Aortic valve sclerosis/calcification is present, without any evidence of aortic stenosis. Pulmonic Valve: The pulmonic valve was normal in structure. Pulmonic valve regurgitation is trivial. No evidence of pulmonic stenosis. Aorta: The aortic root is normal in size and structure. Venous:  The inferior vena cava is normal in size with  greater than 50% respiratory variability, suggesting right atrial pressure of 3 mmHg. IAS/Shunts: No atrial level shunt detected by color flow Doppler.  LEFT VENTRICLE PLAX 2D LVIDd:         4.50 cm   Diastology LVIDs:         3.00 cm   LV e' medial:    6.64 cm/s LV PW:         0.90 cm   LV E/e' medial:  8.5 LV IVS:        0.90 cm   LV e' lateral:   9.14 cm/s LVOT diam:     2.10 cm   LV E/e' lateral: 6.2 LV SV:         73 LV SV Index:   37 LVOT Area:     3.46 cm  RIGHT VENTRICLE RV Basal diam:  2.90 cm RV S prime:     6.60 cm/s TAPSE (Lee-mode): 1.5 cm LEFT ATRIUM             Index        RIGHT ATRIUM           Index LA diam:        4.30 cm 2.15 cm/Lee   RA Area:     18.30 cm LA Vol (A2C):   51.7 ml 25.84 ml/Lee  RA Volume:   48.30 ml  24.14 ml/Lee LA Vol (A4C):   52.0 ml 25.99 ml/Lee LA Biplane Vol: 51.9 ml 25.94 ml/Lee  AORTIC VALVE LVOT Vmax:   94.20 cm/s LVOT Vmean:  59.100 cm/s LVOT VTI:    0.212 Lee  AORTA Ao Root diam: 3.50 cm Ao Asc diam:  3.10 cm MITRAL VALVE MV Area (PHT): 2.66 cm    SHUNTS MV Decel Time: 285 msec    Systemic VTI:  0.21 Lee MV E velocity: 56.40 cm/s  Systemic Diam: 2.10 cm MV A velocity: 70.60 cm/s MV E/A ratio:  0.80 Fransico Him MD Electronically signed by Fransico Him MD Signature Date/Time: 04/20/2022/3:00:09 PM    Final    DG Chest 2 View  Result Date: 04/20/2022 CLINICAL DATA:  Chest pain in center of chest pressure radiating to the left arm. EXAM: CHEST - 2 VIEW COMPARISON:  Chest two views 07/24/2007 FINDINGS: Status post median sternotomy and CABG. Cardiac silhouette and mediastinal contours are within normal limits. Mild calcification within the aortic arch. Unchanged calcified benign granuloma within the inferior medial anterior right lower lobe. Mild bibasilar bronchovascular crowding. No pleural effusion or pneumothorax. Partial visualization of right shoulder arthroplasty. IMPRESSION: No active cardiopulmonary disease. Electronically  Signed   By: Yvonne Kendall Lee.D.   On: 04/20/2022 10:53    Assessment/Plan:   1. NSTEMI (non-ST elevated myocardial infarction) ([place]) - s/p PCI  - cont asa and Brilinta - cont statin  2. COVID-19 virus infection - 08/18 tested positive - completed molnupiravir - denies ling term symptoms  3. MCI (mild cognitive impairment) - MMSE 25/30 - followed by Dr. Jaynee Eagles- neuropsych  4. Anxiety - cont xanax prn   5. Diabetes mellitus type 2 in obese (HCC) - A1c 8.0 04/20/2022 - cont farxiga and tradjenta - cont asa, losartan and statin  6. Dyslipidemia - LDL 42 04/21/2022 - cont simvastatin  7. Shuffling gait - no recent falls - ambulates with walker - cont PT/OT  8. Stage 3b chronic kidney disease ([place] [facility]) - avoid nephrotoxic drugs like NSAIDS and dose adjust medications to be renally excreted - encourage hydration with water  Patient is being discharged with the following home health services:  PT/OT  Patient is being discharged with the following durable medical equipment: none  Patient has been advised to f/u with their PCP in 1-2 weeks to bring them up to date on their rehab stay.  Social services at facility was responsible for arranging this appointment.  Pt was provided with a 30 day supply of prescriptions for medications and refills must be obtained from their PCP.  For controlled substances, a more limited supply may be provided adequate until PCP appointment only.  Future labs/tests needed: none

## 2022-05-09 DIAGNOSIS — R2689 Other abnormalities of gait and mobility: Secondary | ICD-10-CM | POA: Diagnosis not present

## 2022-05-09 DIAGNOSIS — M6389 Disorders of muscle in diseases classified elsewhere, multiple sites: Secondary | ICD-10-CM | POA: Diagnosis not present

## 2022-05-09 DIAGNOSIS — F411 Generalized anxiety disorder: Secondary | ICD-10-CM | POA: Diagnosis not present

## 2022-05-09 DIAGNOSIS — N1832 Chronic kidney disease, stage 3b: Secondary | ICD-10-CM | POA: Diagnosis not present

## 2022-05-09 DIAGNOSIS — I10 Essential (primary) hypertension: Secondary | ICD-10-CM | POA: Diagnosis not present

## 2022-05-09 DIAGNOSIS — R278 Other lack of coordination: Secondary | ICD-10-CM | POA: Diagnosis not present

## 2022-05-09 DIAGNOSIS — I200 Unstable angina: Secondary | ICD-10-CM | POA: Diagnosis not present

## 2022-05-09 DIAGNOSIS — R41841 Cognitive communication deficit: Secondary | ICD-10-CM | POA: Diagnosis not present

## 2022-05-09 DIAGNOSIS — G3184 Mild cognitive impairment, so stated: Secondary | ICD-10-CM | POA: Diagnosis not present

## 2022-05-10 DIAGNOSIS — I10 Essential (primary) hypertension: Secondary | ICD-10-CM | POA: Diagnosis not present

## 2022-05-10 DIAGNOSIS — I200 Unstable angina: Secondary | ICD-10-CM | POA: Diagnosis not present

## 2022-05-10 DIAGNOSIS — M6389 Disorders of muscle in diseases classified elsewhere, multiple sites: Secondary | ICD-10-CM | POA: Diagnosis not present

## 2022-05-10 DIAGNOSIS — G3184 Mild cognitive impairment, so stated: Secondary | ICD-10-CM | POA: Diagnosis not present

## 2022-05-10 DIAGNOSIS — R2689 Other abnormalities of gait and mobility: Secondary | ICD-10-CM | POA: Diagnosis not present

## 2022-05-10 DIAGNOSIS — R41841 Cognitive communication deficit: Secondary | ICD-10-CM | POA: Diagnosis not present

## 2022-05-10 DIAGNOSIS — F411 Generalized anxiety disorder: Secondary | ICD-10-CM | POA: Diagnosis not present

## 2022-05-10 DIAGNOSIS — R278 Other lack of coordination: Secondary | ICD-10-CM | POA: Diagnosis not present

## 2022-05-10 DIAGNOSIS — N1832 Chronic kidney disease, stage 3b: Secondary | ICD-10-CM | POA: Diagnosis not present

## 2022-05-11 DIAGNOSIS — G3184 Mild cognitive impairment, so stated: Secondary | ICD-10-CM | POA: Diagnosis not present

## 2022-05-11 DIAGNOSIS — R2689 Other abnormalities of gait and mobility: Secondary | ICD-10-CM | POA: Diagnosis not present

## 2022-05-11 DIAGNOSIS — I25810 Atherosclerosis of coronary artery bypass graft(s) without angina pectoris: Secondary | ICD-10-CM | POA: Diagnosis not present

## 2022-05-11 DIAGNOSIS — N1832 Chronic kidney disease, stage 3b: Secondary | ICD-10-CM | POA: Diagnosis not present

## 2022-05-11 DIAGNOSIS — E1159 Type 2 diabetes mellitus with other circulatory complications: Secondary | ICD-10-CM | POA: Diagnosis not present

## 2022-05-11 DIAGNOSIS — E1129 Type 2 diabetes mellitus with other diabetic kidney complication: Secondary | ICD-10-CM | POA: Diagnosis not present

## 2022-05-11 DIAGNOSIS — R278 Other lack of coordination: Secondary | ICD-10-CM | POA: Diagnosis not present

## 2022-05-11 DIAGNOSIS — Z8546 Personal history of malignant neoplasm of prostate: Secondary | ICD-10-CM | POA: Diagnosis not present

## 2022-05-11 DIAGNOSIS — E7800 Pure hypercholesterolemia, unspecified: Secondary | ICD-10-CM | POA: Diagnosis not present

## 2022-05-11 DIAGNOSIS — I200 Unstable angina: Secondary | ICD-10-CM | POA: Diagnosis not present

## 2022-05-11 DIAGNOSIS — D692 Other nonthrombocytopenic purpura: Secondary | ICD-10-CM | POA: Diagnosis not present

## 2022-05-11 DIAGNOSIS — M1990 Unspecified osteoarthritis, unspecified site: Secondary | ICD-10-CM | POA: Diagnosis not present

## 2022-05-11 DIAGNOSIS — I1310 Hypertensive heart and chronic kidney disease without heart failure, with stage 1 through stage 4 chronic kidney disease, or unspecified chronic kidney disease: Secondary | ICD-10-CM | POA: Diagnosis not present

## 2022-05-11 DIAGNOSIS — N181 Chronic kidney disease, stage 1: Secondary | ICD-10-CM | POA: Diagnosis not present

## 2022-05-11 DIAGNOSIS — D472 Monoclonal gammopathy: Secondary | ICD-10-CM | POA: Diagnosis not present

## 2022-05-11 DIAGNOSIS — I4510 Unspecified right bundle-branch block: Secondary | ICD-10-CM | POA: Diagnosis not present

## 2022-05-14 DIAGNOSIS — I200 Unstable angina: Secondary | ICD-10-CM | POA: Diagnosis not present

## 2022-05-14 DIAGNOSIS — M6389 Disorders of muscle in diseases classified elsewhere, multiple sites: Secondary | ICD-10-CM | POA: Diagnosis not present

## 2022-05-14 DIAGNOSIS — G3184 Mild cognitive impairment, so stated: Secondary | ICD-10-CM | POA: Diagnosis not present

## 2022-05-14 DIAGNOSIS — R2689 Other abnormalities of gait and mobility: Secondary | ICD-10-CM | POA: Diagnosis not present

## 2022-05-14 DIAGNOSIS — N1832 Chronic kidney disease, stage 3b: Secondary | ICD-10-CM | POA: Diagnosis not present

## 2022-05-14 DIAGNOSIS — R278 Other lack of coordination: Secondary | ICD-10-CM | POA: Diagnosis not present

## 2022-05-15 DIAGNOSIS — M6389 Disorders of muscle in diseases classified elsewhere, multiple sites: Secondary | ICD-10-CM | POA: Diagnosis not present

## 2022-05-15 DIAGNOSIS — R278 Other lack of coordination: Secondary | ICD-10-CM | POA: Diagnosis not present

## 2022-05-15 DIAGNOSIS — R2689 Other abnormalities of gait and mobility: Secondary | ICD-10-CM | POA: Diagnosis not present

## 2022-05-15 DIAGNOSIS — G3184 Mild cognitive impairment, so stated: Secondary | ICD-10-CM | POA: Diagnosis not present

## 2022-05-15 DIAGNOSIS — I200 Unstable angina: Secondary | ICD-10-CM | POA: Diagnosis not present

## 2022-05-15 DIAGNOSIS — N1832 Chronic kidney disease, stage 3b: Secondary | ICD-10-CM | POA: Diagnosis not present

## 2022-05-17 DIAGNOSIS — R278 Other lack of coordination: Secondary | ICD-10-CM | POA: Diagnosis not present

## 2022-05-17 DIAGNOSIS — I200 Unstable angina: Secondary | ICD-10-CM | POA: Diagnosis not present

## 2022-05-17 DIAGNOSIS — N1832 Chronic kidney disease, stage 3b: Secondary | ICD-10-CM | POA: Diagnosis not present

## 2022-05-17 DIAGNOSIS — G3184 Mild cognitive impairment, so stated: Secondary | ICD-10-CM | POA: Diagnosis not present

## 2022-05-17 DIAGNOSIS — R2689 Other abnormalities of gait and mobility: Secondary | ICD-10-CM | POA: Diagnosis not present

## 2022-05-17 DIAGNOSIS — M6389 Disorders of muscle in diseases classified elsewhere, multiple sites: Secondary | ICD-10-CM | POA: Diagnosis not present

## 2022-05-21 DIAGNOSIS — N1832 Chronic kidney disease, stage 3b: Secondary | ICD-10-CM | POA: Diagnosis not present

## 2022-05-21 DIAGNOSIS — R2689 Other abnormalities of gait and mobility: Secondary | ICD-10-CM | POA: Diagnosis not present

## 2022-05-21 DIAGNOSIS — G3184 Mild cognitive impairment, so stated: Secondary | ICD-10-CM | POA: Diagnosis not present

## 2022-05-21 DIAGNOSIS — I200 Unstable angina: Secondary | ICD-10-CM | POA: Diagnosis not present

## 2022-05-21 DIAGNOSIS — R278 Other lack of coordination: Secondary | ICD-10-CM | POA: Diagnosis not present

## 2022-05-22 DIAGNOSIS — G3184 Mild cognitive impairment, so stated: Secondary | ICD-10-CM | POA: Diagnosis not present

## 2022-05-22 DIAGNOSIS — R278 Other lack of coordination: Secondary | ICD-10-CM | POA: Diagnosis not present

## 2022-05-22 DIAGNOSIS — I200 Unstable angina: Secondary | ICD-10-CM | POA: Diagnosis not present

## 2022-05-22 DIAGNOSIS — R2689 Other abnormalities of gait and mobility: Secondary | ICD-10-CM | POA: Diagnosis not present

## 2022-05-22 DIAGNOSIS — N1832 Chronic kidney disease, stage 3b: Secondary | ICD-10-CM | POA: Diagnosis not present

## 2022-06-08 DIAGNOSIS — E1129 Type 2 diabetes mellitus with other diabetic kidney complication: Secondary | ICD-10-CM | POA: Diagnosis not present

## 2022-06-08 DIAGNOSIS — K222 Esophageal obstruction: Secondary | ICD-10-CM | POA: Diagnosis not present

## 2022-06-08 DIAGNOSIS — Z23 Encounter for immunization: Secondary | ICD-10-CM | POA: Diagnosis not present

## 2022-06-08 DIAGNOSIS — R531 Weakness: Secondary | ICD-10-CM | POA: Diagnosis not present

## 2022-06-08 DIAGNOSIS — I1310 Hypertensive heart and chronic kidney disease without heart failure, with stage 1 through stage 4 chronic kidney disease, or unspecified chronic kidney disease: Secondary | ICD-10-CM | POA: Diagnosis not present

## 2022-06-08 DIAGNOSIS — G3184 Mild cognitive impairment, so stated: Secondary | ICD-10-CM | POA: Diagnosis not present

## 2022-06-08 DIAGNOSIS — K219 Gastro-esophageal reflux disease without esophagitis: Secondary | ICD-10-CM | POA: Diagnosis not present

## 2022-06-08 DIAGNOSIS — R251 Tremor, unspecified: Secondary | ICD-10-CM | POA: Diagnosis not present

## 2022-06-08 DIAGNOSIS — I25810 Atherosclerosis of coronary artery bypass graft(s) without angina pectoris: Secondary | ICD-10-CM | POA: Diagnosis not present

## 2022-06-27 ENCOUNTER — Telehealth: Payer: Self-pay | Admitting: Cardiology

## 2022-06-27 MED ORDER — NITROGLYCERIN 0.4 MG SL SUBL: SUBLINGUAL_TABLET | SUBLINGUAL | 6 refills | Status: DC

## 2022-06-27 NOTE — Telephone Encounter (Signed)
Pt c/o of Chest Pain: STAT if CP now or developed within 24 hours  1. Are you having CP right now? No   2. Are you experiencing any other symptoms (ex. SOB, nausea, vomiting, sweating)? Cold sweat and pressure on chest   3. How long have you been experiencing CP? A week   4. Is your CP continuous or coming and going? Coming and going   5. Have you taken Nitroglycerin? No   Pt states that he had heart attack 04/20/2022 and was not reached out to by Dr. Martinique. He says that is fine, but he is now having pressure in chest. Especially if he is laying down and cold sweats.  ?

## 2022-06-27 NOTE — Telephone Encounter (Signed)
Spoke to patient he stated he has been having episodes of chest pressure off and on for the past 1 month.Stated for the past 1 week he is having more frequent episodes.Stated chest pain wakes him during night followed by a cold sweat.Stated he will get out of bed and walk around until pain resolves.He has not taken NTG.Stated he had similar symptoms when he had last heart attack.No chest pressure at present.Appointment scheduled with Dr.Jordan 10/24 at 8:20 am.Advised if he has any more chest pressure he needs to go to ED to be evaluated.

## 2022-06-28 NOTE — Progress Notes (Signed)
Tony Lee Date of Birth: [birth year]   History of Present Illness: Mr. Paolucci is seen today for followup of CAD. He has a history of coronary disease and is status post coronary bypass surgery in October of 2008. Myoview study in December 2014 showed a slight scar at the base of the inferolateral wall. No ischemia. EF 62%. He has DM, dyslipidemia, and chronic RBBB.   He was recently admitted in August with NSTEMI. Troponin peak 114. Underwent cardiac cath showing patent grafts but culprit lesion in the PL distal to the graft. This was successfully stented. Echo showed normal LV function. Was DC on ASA and Brilinta.  On follow up today he is doing well.  He lives at [facility]. Walks daily. Uses a walker.  No chest pain.no palpitations or dyspnea. Feels well.   Current Outpatient Medications on File Prior to Visit  Medication Sig Dispense Refill   ALPRAZolam (XANAX) 0.5 MG tablet Take 1 tablet (0.5 mg total) by mouth at bedtime as needed for anxiety. 5 tablet 0   aspirin EC 81 MG tablet Take 1 tablet (81 mg total) by mouth daily. Swallow whole. 30 tablet 12   Black Pepper-Turmeric 3-500 MG CAPS Take 1 capsule by mouth every Monday, Wednesday, and Friday.     Cyanocobalamin (VITAMIN B-12 PO) Take 5,000 mcg by mouth at bedtime.     diclofenac Sodium (VOLTAREN) 1 % GEL Apply 4 g topically See admin instructions. Apply to affected Lee of right wrist 4 times a day     FARXIGA 5 MG TABS tablet Take 5 mg by mouth daily.     nitroGLYCERIN (NITROSTAT) 0.4 MG SL tablet PLACE 1 TABLET (0.4 MG TOTAL) UNDER THE TONGUE EVERY 5 (FIVE) MINUTES AS NEEDED. 25 tablet 6   omeprazole (PRILOSEC) 40 MG capsule Take 40 mg by mouth daily before breakfast.     polyethylene glycol powder (GLYCOLAX/MIRALAX) 17 GM/SCOOP powder Take 17 g by mouth at bedtime as needed for mild constipation.     simvastatin (ZOCOR) 20 MG tablet Take 20 mg by mouth at bedtime.       TRADJENTA 5 MG TABS tablet Take 5 mg by mouth  daily.  6   ZYRTEC ALLERGY 10 MG tablet Take 10 mg by mouth daily as needed for allergies.     fenofibrate 160 MG tablet Take 1 tablet (160 mg total) by mouth daily. 30 tablet 0   losartan (COZAAR) 25 MG tablet Take 1 tablet (25 mg total) by mouth daily. 30 tablet 0   No current facility-administered medications on file prior to visit.    Allergies  Allergen Reactions   Other Itching     Mepergan Fortis, (meprozine)  = itching (patient disputes this in 2023)    Past Medical History:  Diagnosis Date   Cancer ([place]) 09/10/2002   prostate   Chronic kidney disease, stage 3b ([place])    Coronary artery disease    Depression    Diabetes mellitus type 2 in obese ([place])    Dyslipidemia    Esophageal stricture    Monoclonal paraproteinemia 03/03/2013   Palpitations    RBBB (right bundle branch block)     Past Surgical History:  Procedure Laterality Date   ANKLE SURGERY Left    APPENDECTOMY     CARDIAC CATHETERIZATION     Dr. Martinique   CATARACT EXTRACTION W/ INTRAOCULAR LENS  IMPLANT, BILATERAL     CORONARY ARTERY BYPASS GRAFT  Oct. 2008   x 5,Dr.Gerhardt  CORONARY/GRAFT ACUTE MI REVASCULARIZATION N/A 04/22/2022   Procedure: Coronary/Graft Acute MI Revascularization;  Surgeon: Martinique, Codie Krogh M, MD;  Location: [facility];  Service: Cardiovascular;  Laterality: N/A;   HERNIA REPAIR Bilateral    LEFT HEART CATH AND CORONARY ANGIOGRAPHY N/A 04/22/2022   Procedure: LEFT HEART CATH AND CORONARY ANGIOGRAPHY;  Surgeon: Martinique, Haywood Meinders M, MD;  Location: [facility];  Service: Cardiovascular;  Laterality: N/A;   PROSTATECTOMY  2004   radical   right shoulder      replacement   vein strippping      Social History   Tobacco Use  Smoking Status Former   Packs/day: 0.50   Years: 20.00   Total pack years: 10.00   Types: Cigarettes   Quit date: 09/11/1967   Years since quitting: 54.8  Smokeless Tobacco Never  Tobacco Comments   quit 45 years ago    Social History    Substance and Sexual Activity  Alcohol Use Yes   Comment: social, 1-2 times per week     Family History  Problem Relation Age of Onset   Heart failure Father        age 69   Heart attack Brother        age 34 post third open heart surgery   Heart attack Brother     Review of Systems: As noted in history of present illness..  All other systems were reviewed and are negative.  Physical Exam: BP 134/72   Pulse 84   Ht '5\' 4"'$  (1.626 m)   Wt 176 lb (79.8 kg)   SpO2 97%   BMI 30.21 kg/m  He is an obese white male in no acute distress. The HEENT exam is normal. The carotids are 2+ without bruits.  There is no thyromegaly.  There is no JVD.  The lungs are clear.   He has a median sternotomy scar. The heart exam reveals a regular rate with a normal S1 and S2.  There are no murmurs, gallops, or rubs.  The PMI is not displaced.   Abdominal exam reveals good bowel sounds.   There is no hepatosplenomegaly or tenderness.  There are no masses.  Exam of the legs reveal no clubbing, cyanosis, or edema.  All pulses are good.  Cranial nerves II - XII are intact.   LABORATORY DATA: ECG today demonstrates normal sinus rhythm with first degree AV block. Right bundle branch block..  Rate 84. I have personally reviewed and interpreted this study.  CXR 04/27/22: NAD  Labs dated 04/13/19: A1c 6.5%. cholesterol 123, triglycerides 88, HDL 36, LDL 69. Creatinine 1.7. otherwise CMET and CBC normal Dated 05/11/22: A1c 7.3%. cholesterol 105, triglycerides 85, HDL 37, LDL 51,   Cardiac cath 04/22/22: Conclusion      Prox LAD to Mid LAD lesion is 70% stenosed.   1st Diag-1 lesion is 100% stenosed.   1st Diag-2 lesion is 70% stenosed.   Prox RCA lesion is 95% stenosed.   Mid RCA lesion is 100% stenosed.   Prox Cx lesion is 40% stenosed.   1st Mrg lesion is 100% stenosed.   3rd Mrg lesion is 100% stenosed.   RPAV lesion is 95% stenosed.   A drug-eluting stent was successfully placed using a [place] [id number].   Post intervention, there is a 0% residual stenosis.   LIMA graft was visualized by angiography and is normal in caliber.   SVG graft was visualized by angiography and is normal in caliber.  SVG graft was visualized by angiography and is normal in caliber.   The graft exhibits no disease.   The graft exhibits no disease.   LV end diastolic pressure is mildly elevated.   3 vessel obstructive CAD, there is a 70% stenosis in the mid LAD which was not grafted Patent LIMA to a large diagonal branch. There is 70% stenosis in the diagonal distal to the graft. Patent SVG to OM1 and 2 Patent SVG to distal RCA. There is a severe stenosis in the PL branch distal to the graft. This appears to be the culprit lesion. Mildly elevated LVEDP 18 mm Hg Successful PCI of the PL branch of the RCA through the vein graft with DES x 1 Severe tortuosity and calcification of the aortoiliac system which made the procedure challenging.    Plan: DAPT with ASA and Brilinta for 12 months. Monitor renal function closely. Would treat residual disease in the LAD and diagonal medically. The LAD could be treated with PCI if he has refractory symptoms.   Coronary Diagrams  Diagnostic Dominance: Right  Intervention   Implants      Echo 04/20/22: IMPRESSIONS     1. Left ventricular ejection fraction, by estimation, is 60 to 65%. The  left ventricle has normal function. The left ventricle has no regional  wall motion abnormalities. There is mild left ventricular hypertrophy of  the basal-septal segment. Left  ventricular diastolic parameters are consistent with Grade I diastolic  dysfunction (impaired relaxation).   2. Right ventricular systolic function is normal. The right ventricular  size is normal.   3. The mitral valve is normal in structure. No evidence of mitral valve  regurgitation. No evidence of mitral stenosis.   4. The aortic valve is tricuspid. Aortic valve regurgitation is  trivial.  Aortic valve sclerosis/calcification is present, without any evidence of  aortic stenosis.   5. The inferior vena cava is normal in size with greater than 50%  respiratory variability, suggesting right atrial pressure of 3 mmHg  Assessment / Plan: 1. Coronary disease status post CABG in 2008. Low risk Myoview in December 2014. S/p NSTEMI in August with patent grafts. S/p stenting of PL branch through SVG. On DAPT for one year. He has no significant angina.  We'll continue with his current therapy including aspirin, metoprolol, and statin therapy.  2. Diabetes mellitus type 2- continue medical therapy per primary care. On Tradjenta. Last A1c 7.3%.   3. Right bundle branch block, chronic  4. Dyslipidemia. On simvastatin and fenofibrate. Good control. LDL 51   I will follow up in 6 months.

## 2022-07-03 ENCOUNTER — Ambulatory Visit: Payer: PPO | Attending: Cardiology | Admitting: Cardiology

## 2022-07-03 ENCOUNTER — Encounter: Payer: Self-pay | Admitting: Cardiology

## 2022-07-03 ENCOUNTER — Ambulatory Visit: Payer: PPO | Admitting: Cardiology

## 2022-07-03 VITALS — BP 134/72 | HR 84 | Ht 64.0 in | Wt 176.0 lb

## 2022-07-03 DIAGNOSIS — I25708 Atherosclerosis of coronary artery bypass graft(s), unspecified, with other forms of angina pectoris: Secondary | ICD-10-CM

## 2022-07-03 DIAGNOSIS — E785 Hyperlipidemia, unspecified: Secondary | ICD-10-CM

## 2022-07-03 DIAGNOSIS — E119 Type 2 diabetes mellitus without complications: Secondary | ICD-10-CM | POA: Diagnosis not present

## 2022-07-03 DIAGNOSIS — I4510 Unspecified right bundle-branch block: Secondary | ICD-10-CM | POA: Diagnosis not present

## 2022-07-17 DIAGNOSIS — J387 Other diseases of larynx: Secondary | ICD-10-CM | POA: Diagnosis not present

## 2022-07-17 DIAGNOSIS — H6123 Impacted cerumen, bilateral: Secondary | ICD-10-CM | POA: Diagnosis not present

## 2022-07-17 DIAGNOSIS — K219 Gastro-esophageal reflux disease without esophagitis: Secondary | ICD-10-CM | POA: Diagnosis not present

## 2022-07-17 DIAGNOSIS — H903 Sensorineural hearing loss, bilateral: Secondary | ICD-10-CM | POA: Diagnosis not present

## 2022-07-17 DIAGNOSIS — H6901 Patulous Eustachian tube, right ear: Secondary | ICD-10-CM | POA: Diagnosis not present

## 2022-07-17 DIAGNOSIS — Z974 Presence of external hearing-aid: Secondary | ICD-10-CM | POA: Diagnosis not present

## 2022-07-20 ENCOUNTER — Ambulatory Visit: Payer: PPO | Admitting: Cardiology

## 2022-08-17 DIAGNOSIS — I25810 Atherosclerosis of coronary artery bypass graft(s) without angina pectoris: Secondary | ICD-10-CM | POA: Diagnosis not present

## 2022-08-17 DIAGNOSIS — B372 Candidiasis of skin and nail: Secondary | ICD-10-CM | POA: Diagnosis not present

## 2022-08-17 DIAGNOSIS — E1129 Type 2 diabetes mellitus with other diabetic kidney complication: Secondary | ICD-10-CM | POA: Diagnosis not present

## 2022-09-26 DIAGNOSIS — H353132 Nonexudative age-related macular degeneration, bilateral, intermediate dry stage: Secondary | ICD-10-CM | POA: Diagnosis not present

## 2022-09-26 DIAGNOSIS — Z961 Presence of intraocular lens: Secondary | ICD-10-CM | POA: Diagnosis not present

## 2022-09-26 DIAGNOSIS — H5203 Hypermetropia, bilateral: Secondary | ICD-10-CM | POA: Diagnosis not present

## 2022-10-15 DIAGNOSIS — R6889 Other general symptoms and signs: Secondary | ICD-10-CM | POA: Diagnosis not present

## 2022-10-15 DIAGNOSIS — Z1152 Encounter for screening for COVID-19: Secondary | ICD-10-CM | POA: Diagnosis not present

## 2022-10-15 DIAGNOSIS — R509 Fever, unspecified: Secondary | ICD-10-CM | POA: Diagnosis not present

## 2022-10-15 DIAGNOSIS — R42 Dizziness and giddiness: Secondary | ICD-10-CM | POA: Diagnosis not present

## 2022-10-15 DIAGNOSIS — E1129 Type 2 diabetes mellitus with other diabetic kidney complication: Secondary | ICD-10-CM | POA: Diagnosis not present

## 2022-10-15 DIAGNOSIS — I252 Old myocardial infarction: Secondary | ICD-10-CM | POA: Diagnosis not present

## 2022-10-15 DIAGNOSIS — D72825 Bandemia: Secondary | ICD-10-CM | POA: Diagnosis not present

## 2022-10-17 DIAGNOSIS — R509 Fever, unspecified: Secondary | ICD-10-CM | POA: Diagnosis not present

## 2022-10-17 DIAGNOSIS — R6889 Other general symptoms and signs: Secondary | ICD-10-CM | POA: Diagnosis not present

## 2022-10-17 DIAGNOSIS — D72825 Bandemia: Secondary | ICD-10-CM | POA: Diagnosis not present

## 2022-11-06 DIAGNOSIS — R4189 Other symptoms and signs involving cognitive functions and awareness: Secondary | ICD-10-CM | POA: Diagnosis not present

## 2022-11-06 DIAGNOSIS — G3184 Mild cognitive impairment, so stated: Secondary | ICD-10-CM | POA: Diagnosis not present

## 2022-11-06 DIAGNOSIS — I200 Unstable angina: Secondary | ICD-10-CM | POA: Diagnosis not present

## 2022-11-06 DIAGNOSIS — M6389 Disorders of muscle in diseases classified elsewhere, multiple sites: Secondary | ICD-10-CM | POA: Diagnosis not present

## 2022-11-06 DIAGNOSIS — N1832 Chronic kidney disease, stage 3b: Secondary | ICD-10-CM | POA: Diagnosis not present

## 2022-11-06 DIAGNOSIS — R41840 Attention and concentration deficit: Secondary | ICD-10-CM | POA: Diagnosis not present

## 2022-11-06 DIAGNOSIS — R278 Other lack of coordination: Secondary | ICD-10-CM | POA: Diagnosis not present

## 2022-11-15 DIAGNOSIS — R278 Other lack of coordination: Secondary | ICD-10-CM | POA: Diagnosis not present

## 2022-11-15 DIAGNOSIS — R41840 Attention and concentration deficit: Secondary | ICD-10-CM | POA: Diagnosis not present

## 2022-11-15 DIAGNOSIS — I200 Unstable angina: Secondary | ICD-10-CM | POA: Diagnosis not present

## 2022-11-15 DIAGNOSIS — M6389 Disorders of muscle in diseases classified elsewhere, multiple sites: Secondary | ICD-10-CM | POA: Diagnosis not present

## 2022-11-15 DIAGNOSIS — E1129 Type 2 diabetes mellitus with other diabetic kidney complication: Secondary | ICD-10-CM | POA: Diagnosis not present

## 2022-11-15 DIAGNOSIS — R4189 Other symptoms and signs involving cognitive functions and awareness: Secondary | ICD-10-CM | POA: Diagnosis not present

## 2022-11-15 DIAGNOSIS — K219 Gastro-esophageal reflux disease without esophagitis: Secondary | ICD-10-CM | POA: Diagnosis not present

## 2022-11-15 DIAGNOSIS — Z125 Encounter for screening for malignant neoplasm of prostate: Secondary | ICD-10-CM | POA: Diagnosis not present

## 2022-11-15 DIAGNOSIS — E7800 Pure hypercholesterolemia, unspecified: Secondary | ICD-10-CM | POA: Diagnosis not present

## 2022-11-15 DIAGNOSIS — N1832 Chronic kidney disease, stage 3b: Secondary | ICD-10-CM | POA: Diagnosis not present

## 2022-11-15 DIAGNOSIS — G3184 Mild cognitive impairment, so stated: Secondary | ICD-10-CM | POA: Diagnosis not present

## 2022-11-16 DIAGNOSIS — R82998 Other abnormal findings in urine: Secondary | ICD-10-CM | POA: Diagnosis not present

## 2022-11-16 DIAGNOSIS — E1129 Type 2 diabetes mellitus with other diabetic kidney complication: Secondary | ICD-10-CM | POA: Diagnosis not present

## 2022-11-22 DIAGNOSIS — I129 Hypertensive chronic kidney disease with stage 1 through stage 4 chronic kidney disease, or unspecified chronic kidney disease: Secondary | ICD-10-CM | POA: Diagnosis not present

## 2022-11-22 DIAGNOSIS — E1129 Type 2 diabetes mellitus with other diabetic kidney complication: Secondary | ICD-10-CM | POA: Diagnosis not present

## 2022-11-22 DIAGNOSIS — Z1339 Encounter for screening examination for other mental health and behavioral disorders: Secondary | ICD-10-CM | POA: Diagnosis not present

## 2022-11-22 DIAGNOSIS — E1159 Type 2 diabetes mellitus with other circulatory complications: Secondary | ICD-10-CM | POA: Diagnosis not present

## 2022-11-22 DIAGNOSIS — N1832 Chronic kidney disease, stage 3b: Secondary | ICD-10-CM | POA: Diagnosis not present

## 2022-11-22 DIAGNOSIS — R531 Weakness: Secondary | ICD-10-CM | POA: Diagnosis not present

## 2022-11-22 DIAGNOSIS — Z0000 Encounter for general adult medical examination without abnormal findings: Secondary | ICD-10-CM | POA: Diagnosis not present

## 2022-11-22 DIAGNOSIS — E669 Obesity, unspecified: Secondary | ICD-10-CM | POA: Diagnosis not present

## 2022-11-22 DIAGNOSIS — I25810 Atherosclerosis of coronary artery bypass graft(s) without angina pectoris: Secondary | ICD-10-CM | POA: Diagnosis not present

## 2022-11-22 DIAGNOSIS — Z1331 Encounter for screening for depression: Secondary | ICD-10-CM | POA: Diagnosis not present

## 2022-11-22 DIAGNOSIS — G3184 Mild cognitive impairment, so stated: Secondary | ICD-10-CM | POA: Diagnosis not present

## 2022-11-22 DIAGNOSIS — Z8546 Personal history of malignant neoplasm of prostate: Secondary | ICD-10-CM | POA: Diagnosis not present

## 2022-12-11 DIAGNOSIS — N1832 Chronic kidney disease, stage 3b: Secondary | ICD-10-CM | POA: Diagnosis not present

## 2022-12-11 DIAGNOSIS — I200 Unstable angina: Secondary | ICD-10-CM | POA: Diagnosis not present

## 2022-12-11 DIAGNOSIS — M6389 Disorders of muscle in diseases classified elsewhere, multiple sites: Secondary | ICD-10-CM | POA: Diagnosis not present

## 2022-12-11 DIAGNOSIS — R278 Other lack of coordination: Secondary | ICD-10-CM | POA: Diagnosis not present

## 2022-12-11 DIAGNOSIS — R4189 Other symptoms and signs involving cognitive functions and awareness: Secondary | ICD-10-CM | POA: Diagnosis not present

## 2022-12-11 DIAGNOSIS — G3184 Mild cognitive impairment, so stated: Secondary | ICD-10-CM | POA: Diagnosis not present

## 2022-12-11 DIAGNOSIS — R41840 Attention and concentration deficit: Secondary | ICD-10-CM | POA: Diagnosis not present

## 2023-01-01 NOTE — Progress Notes (Signed)
Tony Lee Date of Birth: 21-Aug-1931   History of Present Illness: Tony Lee is seen today for followup of CAD. He has a history of coronary disease and is status post coronary bypass surgery in October of 2008. Myoview study in December 2014 showed a slight scar at the base of the inferolateral wall. No ischemia. EF 62%. He has DM, dyslipidemia, and chronic RBBB.   He was recently admitted in August with NSTEMI. Troponin peak 114. Underwent cardiac cath showing patent grafts but culprit lesion in the PL distal to the graft. This was successfully stented. Echo showed normal LV function. Was DC on ASA and Brilinta.  On follow up today he is doing well.  He lives at [facility]. Walks daily. Uses a walker.  No chest pain.no palpitations or dyspnea. Feels well.   Current Outpatient Medications on File Prior to Visit  Medication Sig Dispense Refill   ALPRAZolam (XANAX) 0.5 MG tablet Take 1 tablet (0.5 mg total) by mouth at bedtime as needed for anxiety. 5 tablet 0   aspirin EC 81 MG tablet Take 1 tablet (81 mg total) by mouth daily. Swallow whole. 30 tablet 12   Black Pepper-Turmeric 3-500 MG CAPS Take 1 capsule by mouth every Monday, Wednesday, and Friday.     Cyanocobalamin (VITAMIN B-12 PO) Take 5,000 mcg by mouth at bedtime.     diclofenac Sodium (VOLTAREN) 1 % GEL Apply 4 g topically See admin instructions. Apply to affected area of right wrist 4 times a day     FARXIGA 5 MG TABS tablet Take 5 mg by mouth daily.     fenofibrate 160 MG tablet Take 1 tablet (160 mg total) by mouth daily. 30 tablet 0   losartan (COZAAR) 25 MG tablet Take 1 tablet (25 mg total) by mouth daily. 30 tablet 0   nitroGLYCERIN (NITROSTAT) 0.4 MG SL tablet PLACE 1 TABLET (0.4 MG TOTAL) UNDER THE TONGUE EVERY 5 (FIVE) MINUTES AS NEEDED. 25 tablet 6   omeprazole (PRILOSEC) 40 MG capsule Take 40 mg by mouth daily before breakfast.     polyethylene glycol powder (GLYCOLAX/MIRALAX) 17 GM/SCOOP powder Take 17 g by  mouth at bedtime as needed for mild constipation.     simvastatin (ZOCOR) 20 MG tablet Take 20 mg by mouth at bedtime.       TRADJENTA 5 MG TABS tablet Take 5 mg by mouth daily.  6   ZYRTEC ALLERGY 10 MG tablet Take 10 mg by mouth daily as needed for allergies.     No current facility-administered medications on file prior to visit.    Allergies  Allergen Reactions   Other Itching     Mepergan Fortis, (meprozine)  = itching (patient disputes this in 2023)    Past Medical History:  Diagnosis Date   Cancer ([facility]) 09/10/2002   prostate   Chronic kidney disease, stage 3b ([facility])    Coronary artery disease    Depression    Diabetes mellitus type 2 in obese ([facility])    Dyslipidemia    Esophageal stricture    Monoclonal paraproteinemia 03/03/2013   Palpitations    RBBB (right bundle branch block)     Past Surgical History:  Procedure Laterality Date   ANKLE SURGERY Left    APPENDECTOMY     CARDIAC CATHETERIZATION     Dr. Swaziland   CATARACT EXTRACTION W/ INTRAOCULAR LENS  IMPLANT, BILATERAL     CORONARY ARTERY BYPASS GRAFT  Oct. 2008   x 5,Dr.Gerhardt  CORONARY/GRAFT ACUTE MI REVASCULARIZATION N/A 04/22/2022   Procedure: Coronary/Graft Acute MI Revascularization;  Surgeon: Swaziland, Lloyd Cullinan M, MD;  Location: [facility];  Service: Cardiovascular;  Laterality: N/A;   HERNIA REPAIR Bilateral    LEFT HEART CATH AND CORONARY ANGIOGRAPHY N/A 04/22/2022   Procedure: LEFT HEART CATH AND CORONARY ANGIOGRAPHY;  Surgeon: Swaziland, Naftula Donahue M, MD;  Location: [facility];  Service: Cardiovascular;  Laterality: N/A;   PROSTATECTOMY  2004   radical   right shoulder      replacement   vein strippping      Social History   Tobacco Use  Smoking Status Former   Packs/day: 0.50   Years: 20.00   Additional pack years: 0.00   Total pack years: 10.00   Types: Cigarettes   Quit date: 09/11/1967   Years since quitting: 55.3  Smokeless Tobacco Never  Tobacco Comments   quit 45 years ago     Social History   Substance and Sexual Activity  Alcohol Use Yes   Comment: social, 1-2 times per week     Family History  Problem Relation Age of Onset   Heart failure Father        age 77   Heart attack Brother        age 65 post third open heart surgery   Heart attack Brother     Review of Systems: As noted in history of present illness..  All other systems were reviewed and are negative.  Physical Exam: There were no vitals taken for this visit. He is an obese white male in no acute distress. The HEENT exam is normal. The carotids are 2+ without bruits.  There is no thyromegaly.  There is no JVD.  The lungs are clear.   He has a median sternotomy scar. The heart exam reveals a regular rate with a normal S1 and S2.  There are no murmurs, gallops, or rubs.  The PMI is not displaced.   Abdominal exam reveals good bowel sounds.   There is no hepatosplenomegaly or tenderness.  There are no masses.  Exam of the legs reveal no clubbing, cyanosis, or edema.  All pulses are good.  Cranial nerves II - XII are intact.   LABORATORY DATA: ECG today demonstrates normal sinus rhythm with first degree AV block. Right bundle branch block..  Rate 84. I have personally reviewed and interpreted this study.  CXR 04/27/22: NAD  Labs dated 04/13/19: A1c 6.5%. cholesterol 123, triglycerides 88, HDL 36, LDL 69. Creatinine 1.7. otherwise CMET and CBC normal Dated 05/11/22: A1c 7.3%. cholesterol 105, triglycerides 85, HDL 37, LDL 51,  Dated 11/15/22: cholesterol 114, triglycerides 61, HDL 43, LDL 59. A1c 7.1%. CBC, CMET and TSH normal.   Cardiac cath 04/22/22: Conclusion      Prox LAD to Mid LAD lesion is 70% stenosed.   1st Diag-1 lesion is 100% stenosed.   1st Diag-2 lesion is 70% stenosed.   Prox RCA lesion is 95% stenosed.   Mid RCA lesion is 100% stenosed.   Prox Cx lesion is 40% stenosed.   1st Mrg lesion is 100% stenosed.   3rd Mrg lesion is 100% stenosed.   RPAV lesion is 95% stenosed.   A  drug-eluting stent was successfully placed using a STENT ONYX FRONTIER 2.25X15.   Post intervention, there is a 0% residual stenosis.   LIMA graft was visualized by angiography and is normal in caliber.   SVG graft was visualized by angiography and is normal  in caliber.   SVG graft was visualized by angiography and is normal in caliber.   The graft exhibits no disease.   The graft exhibits no disease.   LV end diastolic pressure is mildly elevated.   3 vessel obstructive CAD, there is a 70% stenosis in the mid LAD which was not grafted Patent LIMA to a large diagonal branch. There is 70% stenosis in the diagonal distal to the graft. Patent SVG to OM1 and 2 Patent SVG to distal RCA. There is a severe stenosis in the PL branch distal to the graft. This appears to be the culprit lesion. Mildly elevated LVEDP 18 mm Hg Successful PCI of the PL branch of the RCA through the vein graft with DES x 1 Severe tortuosity and calcification of the aortoiliac system which made the procedure challenging.    Plan: DAPT with ASA and Brilinta for 12 months. Monitor renal function closely. Would treat residual disease in the LAD and diagonal medically. The LAD could be treated with PCI if he has refractory symptoms.   Coronary Diagrams  Diagnostic Dominance: Right  Intervention   Implants      Echo 04/20/22: IMPRESSIONS     1. Left ventricular ejection fraction, by estimation, is 60 to 65%. The  left ventricle has normal function. The left ventricle has no regional  wall motion abnormalities. There is mild left ventricular hypertrophy of  the basal-septal segment. Left  ventricular diastolic parameters are consistent with Grade I diastolic  dysfunction (impaired relaxation).   2. Right ventricular systolic function is normal. The right ventricular  size is normal.   3. The mitral valve is normal in structure. No evidence of mitral valve  regurgitation. No evidence of mitral stenosis.   4. The  aortic valve is tricuspid. Aortic valve regurgitation is trivial.  Aortic valve sclerosis/calcification is present, without any evidence of  aortic stenosis.   5. The inferior vena cava is normal in size with greater than 50%  respiratory variability, suggesting right atrial pressure of 3 mmHg  Assessment / Plan: 1. Coronary disease status post CABG in 2008. Low risk Myoview in December 2014. S/p NSTEMI in August with patent grafts. S/p stenting of PL branch through SVG. On DAPT for one year. He has no significant angina.  We'll continue with his current therapy including aspirin, metoprolol, and statin therapy.  2. Diabetes mellitus type 2- continue medical therapy per primary care. On Tradjenta. Last A1c 7.3%.   3. Right bundle branch block, chronic  4. Dyslipidemia. On simvastatin and fenofibrate. Good control. LDL 51   I will follow up in 6 months.

## 2023-01-07 ENCOUNTER — Encounter: Payer: Self-pay | Admitting: Cardiology

## 2023-01-07 ENCOUNTER — Ambulatory Visit: Payer: PPO | Attending: Cardiology | Admitting: Cardiology

## 2023-01-07 VITALS — BP 128/62 | HR 91 | Ht 66.0 in | Wt 173.2 lb

## 2023-01-07 DIAGNOSIS — I25708 Atherosclerosis of coronary artery bypass graft(s), unspecified, with other forms of angina pectoris: Secondary | ICD-10-CM

## 2023-01-07 DIAGNOSIS — N1831 Chronic kidney disease, stage 3a: Secondary | ICD-10-CM | POA: Diagnosis not present

## 2023-01-07 DIAGNOSIS — E785 Hyperlipidemia, unspecified: Secondary | ICD-10-CM | POA: Diagnosis not present

## 2023-01-07 DIAGNOSIS — I4510 Unspecified right bundle-branch block: Secondary | ICD-10-CM

## 2023-01-07 DIAGNOSIS — E119 Type 2 diabetes mellitus without complications: Secondary | ICD-10-CM

## 2023-01-07 MED ORDER — FENOFIBRATE 48 MG PO TABS: 48.0000 mg | ORAL_TABLET | Freq: Every day | ORAL | 3 refills | Status: AC

## 2023-01-07 NOTE — Patient Instructions (Signed)
Medication Instructions:  Decrease Fenofibrate to 48 mg daily Stop Brillinta  04/21/23 Continue all other medications *If you need a refill on your cardiac medications before your next appointment, please call your pharmacy*   Lab Work: None ordered   Testing/Procedures: None ordered   Follow-Up: At [facility], you and your health needs are our priority.  As part of our continuing mission to provide you with exceptional heart care, we have created designated Provider Care Teams.  These Care Teams include your primary Cardiologist (physician) and Advanced Practice Providers (APPs -  Physician Assistants and Nurse Practitioners) who all work together to provide you with the care you need, when you need it.  We recommend signing up for the patient portal called "MyChart".  Sign up information is provided on this After Visit Summary.  MyChart is used to connect with patients for Virtual Visits (Telemedicine).  Patients are able to view lab/test results, encounter notes, upcoming appointments, etc.  Non-urgent messages can be sent to your provider as well.   To learn more about what you can do with MyChart, go to [email or web address].    Your next appointment:  6 months     Call  in [facility] May to schedule Oct appointment     Provider:  Dr.Jordan

## 2023-01-15 DIAGNOSIS — N1832 Chronic kidney disease, stage 3b: Secondary | ICD-10-CM | POA: Diagnosis not present

## 2023-01-15 DIAGNOSIS — R278 Other lack of coordination: Secondary | ICD-10-CM | POA: Diagnosis not present

## 2023-01-15 DIAGNOSIS — R41840 Attention and concentration deficit: Secondary | ICD-10-CM | POA: Diagnosis not present

## 2023-01-15 DIAGNOSIS — G3184 Mild cognitive impairment, so stated: Secondary | ICD-10-CM | POA: Diagnosis not present

## 2023-01-15 DIAGNOSIS — I200 Unstable angina: Secondary | ICD-10-CM | POA: Diagnosis not present

## 2023-01-15 DIAGNOSIS — R4189 Other symptoms and signs involving cognitive functions and awareness: Secondary | ICD-10-CM | POA: Diagnosis not present

## 2023-01-15 DIAGNOSIS — M6389 Disorders of muscle in diseases classified elsewhere, multiple sites: Secondary | ICD-10-CM | POA: Diagnosis not present

## 2023-02-12 DIAGNOSIS — R4189 Other symptoms and signs involving cognitive functions and awareness: Secondary | ICD-10-CM | POA: Diagnosis not present

## 2023-02-12 DIAGNOSIS — G3184 Mild cognitive impairment, so stated: Secondary | ICD-10-CM | POA: Diagnosis not present

## 2023-02-12 DIAGNOSIS — R41840 Attention and concentration deficit: Secondary | ICD-10-CM | POA: Diagnosis not present

## 2023-02-12 DIAGNOSIS — M6389 Disorders of muscle in diseases classified elsewhere, multiple sites: Secondary | ICD-10-CM | POA: Diagnosis not present

## 2023-02-12 DIAGNOSIS — N1832 Chronic kidney disease, stage 3b: Secondary | ICD-10-CM | POA: Diagnosis not present

## 2023-02-12 DIAGNOSIS — I200 Unstable angina: Secondary | ICD-10-CM | POA: Diagnosis not present

## 2023-02-12 DIAGNOSIS — R278 Other lack of coordination: Secondary | ICD-10-CM | POA: Diagnosis not present

## 2023-03-12 DIAGNOSIS — M25512 Pain in left shoulder: Secondary | ICD-10-CM | POA: Diagnosis not present

## 2023-03-12 DIAGNOSIS — R278 Other lack of coordination: Secondary | ICD-10-CM | POA: Diagnosis not present

## 2023-03-12 DIAGNOSIS — M6281 Muscle weakness (generalized): Secondary | ICD-10-CM | POA: Diagnosis not present

## 2023-03-12 DIAGNOSIS — G3184 Mild cognitive impairment, so stated: Secondary | ICD-10-CM | POA: Diagnosis not present

## 2023-03-12 DIAGNOSIS — R41840 Attention and concentration deficit: Secondary | ICD-10-CM | POA: Diagnosis not present

## 2023-03-12 DIAGNOSIS — I200 Unstable angina: Secondary | ICD-10-CM | POA: Diagnosis not present

## 2023-03-12 DIAGNOSIS — M24811 Other specific joint derangements of right shoulder, not elsewhere classified: Secondary | ICD-10-CM | POA: Diagnosis not present

## 2023-03-12 DIAGNOSIS — M6389 Disorders of muscle in diseases classified elsewhere, multiple sites: Secondary | ICD-10-CM | POA: Diagnosis not present

## 2023-03-12 DIAGNOSIS — N1832 Chronic kidney disease, stage 3b: Secondary | ICD-10-CM | POA: Diagnosis not present

## 2023-03-12 DIAGNOSIS — R4189 Other symptoms and signs involving cognitive functions and awareness: Secondary | ICD-10-CM | POA: Diagnosis not present

## 2023-04-15 DIAGNOSIS — R278 Other lack of coordination: Secondary | ICD-10-CM | POA: Diagnosis not present

## 2023-04-15 DIAGNOSIS — M25512 Pain in left shoulder: Secondary | ICD-10-CM | POA: Diagnosis not present

## 2023-04-15 DIAGNOSIS — M24811 Other specific joint derangements of right shoulder, not elsewhere classified: Secondary | ICD-10-CM | POA: Diagnosis not present

## 2023-04-15 DIAGNOSIS — M6281 Muscle weakness (generalized): Secondary | ICD-10-CM | POA: Diagnosis not present

## 2023-04-29 DIAGNOSIS — H6123 Impacted cerumen, bilateral: Secondary | ICD-10-CM | POA: Diagnosis not present

## 2023-04-29 DIAGNOSIS — Z974 Presence of external hearing-aid: Secondary | ICD-10-CM | POA: Diagnosis not present

## 2023-05-30 DIAGNOSIS — E669 Obesity, unspecified: Secondary | ICD-10-CM | POA: Diagnosis not present

## 2023-05-30 DIAGNOSIS — N1832 Chronic kidney disease, stage 3b: Secondary | ICD-10-CM | POA: Diagnosis not present

## 2023-05-30 DIAGNOSIS — I4510 Unspecified right bundle-branch block: Secondary | ICD-10-CM | POA: Diagnosis not present

## 2023-05-30 DIAGNOSIS — E7800 Pure hypercholesterolemia, unspecified: Secondary | ICD-10-CM | POA: Diagnosis not present

## 2023-05-30 DIAGNOSIS — I1310 Hypertensive heart and chronic kidney disease without heart failure, with stage 1 through stage 4 chronic kidney disease, or unspecified chronic kidney disease: Secondary | ICD-10-CM | POA: Diagnosis not present

## 2023-05-30 DIAGNOSIS — E1159 Type 2 diabetes mellitus with other circulatory complications: Secondary | ICD-10-CM | POA: Diagnosis not present

## 2023-05-30 DIAGNOSIS — D692 Other nonthrombocytopenic purpura: Secondary | ICD-10-CM | POA: Diagnosis not present

## 2023-05-30 DIAGNOSIS — E1129 Type 2 diabetes mellitus with other diabetic kidney complication: Secondary | ICD-10-CM | POA: Diagnosis not present

## 2023-05-30 DIAGNOSIS — G3184 Mild cognitive impairment, so stated: Secondary | ICD-10-CM | POA: Diagnosis not present

## 2023-05-30 DIAGNOSIS — Z23 Encounter for immunization: Secondary | ICD-10-CM | POA: Diagnosis not present

## 2023-05-30 DIAGNOSIS — I25810 Atherosclerosis of coronary artery bypass graft(s) without angina pectoris: Secondary | ICD-10-CM | POA: Diagnosis not present

## 2023-05-30 DIAGNOSIS — Z8546 Personal history of malignant neoplasm of prostate: Secondary | ICD-10-CM | POA: Diagnosis not present

## 2023-07-04 DIAGNOSIS — I200 Unstable angina: Secondary | ICD-10-CM | POA: Diagnosis not present

## 2023-07-04 DIAGNOSIS — G3184 Mild cognitive impairment, so stated: Secondary | ICD-10-CM | POA: Diagnosis not present

## 2023-07-04 DIAGNOSIS — I214 Non-ST elevation (NSTEMI) myocardial infarction: Secondary | ICD-10-CM | POA: Diagnosis not present

## 2023-07-04 DIAGNOSIS — H542X11 Low vision right eye category 1, low vision left eye category 1: Secondary | ICD-10-CM | POA: Diagnosis not present

## 2023-07-04 DIAGNOSIS — R41840 Attention and concentration deficit: Secondary | ICD-10-CM | POA: Diagnosis not present

## 2023-07-04 DIAGNOSIS — N1832 Chronic kidney disease, stage 3b: Secondary | ICD-10-CM | POA: Diagnosis not present

## 2023-07-04 DIAGNOSIS — R4189 Other symptoms and signs involving cognitive functions and awareness: Secondary | ICD-10-CM | POA: Diagnosis not present

## 2023-07-04 DIAGNOSIS — R278 Other lack of coordination: Secondary | ICD-10-CM | POA: Diagnosis not present

## 2023-07-12 DIAGNOSIS — R41840 Attention and concentration deficit: Secondary | ICD-10-CM | POA: Diagnosis not present

## 2023-07-12 DIAGNOSIS — H542X11 Low vision right eye category 1, low vision left eye category 1: Secondary | ICD-10-CM | POA: Diagnosis not present

## 2023-07-12 DIAGNOSIS — R4189 Other symptoms and signs involving cognitive functions and awareness: Secondary | ICD-10-CM | POA: Diagnosis not present

## 2023-07-12 DIAGNOSIS — R278 Other lack of coordination: Secondary | ICD-10-CM | POA: Diagnosis not present

## 2023-07-12 DIAGNOSIS — N1832 Chronic kidney disease, stage 3b: Secondary | ICD-10-CM | POA: Diagnosis not present

## 2023-07-12 DIAGNOSIS — I200 Unstable angina: Secondary | ICD-10-CM | POA: Diagnosis not present

## 2023-07-12 DIAGNOSIS — G3184 Mild cognitive impairment, so stated: Secondary | ICD-10-CM | POA: Diagnosis not present

## 2023-07-12 DIAGNOSIS — I214 Non-ST elevation (NSTEMI) myocardial infarction: Secondary | ICD-10-CM | POA: Diagnosis not present

## 2023-07-15 ENCOUNTER — Ambulatory Visit: Payer: PPO | Attending: Cardiology | Admitting: Cardiology

## 2023-07-15 ENCOUNTER — Encounter: Payer: Self-pay | Admitting: Cardiology

## 2023-07-15 VITALS — BP 100/60 | HR 81 | Ht 65.0 in | Wt 177.0 lb

## 2023-07-15 DIAGNOSIS — N1831 Chronic kidney disease, stage 3a: Secondary | ICD-10-CM

## 2023-07-15 DIAGNOSIS — I4510 Unspecified right bundle-branch block: Secondary | ICD-10-CM

## 2023-07-15 DIAGNOSIS — E785 Hyperlipidemia, unspecified: Secondary | ICD-10-CM | POA: Diagnosis not present

## 2023-07-15 DIAGNOSIS — I25708 Atherosclerosis of coronary artery bypass graft(s), unspecified, with other forms of angina pectoris: Secondary | ICD-10-CM | POA: Diagnosis not present

## 2023-07-15 DIAGNOSIS — E119 Type 2 diabetes mellitus without complications: Secondary | ICD-10-CM | POA: Diagnosis not present

## 2023-07-15 NOTE — Patient Instructions (Signed)
Medication Instructions:  No changes  *If you need a refill on your cardiac medications before your next appointment, please call your pharmacy*   Follow-Up: At [facility], you and your health needs are our priority.  As part of our continuing mission to provide you with exceptional heart care, we have created designated Provider Care Teams.  These Care Teams include your primary Cardiologist (physician) and Advanced Practice Providers (APPs -  Physician Assistants and Nurse Practitioners) who all work together to provide you with the care you need, when you need it.  We recommend signing up for the patient portal called "MyChart".  Sign up information is provided on this After Visit Summary.  MyChart is used to connect with patients for Virtual Visits (Telemedicine).  Patients are able to view lab/test results, encounter notes, upcoming appointments, etc.  Non-urgent messages can be sent to your provider as well.   To learn more about what you can do with MyChart, go to [email or web address].    Your next appointment:   6 month(s)  Call in Feb 2025 to make an appointment for May 2025  Provider:   Peter [place], MD

## 2023-07-23 ENCOUNTER — Encounter: Payer: Self-pay | Admitting: Internal Medicine

## 2023-07-23 ENCOUNTER — Ambulatory Visit: Payer: PPO | Admitting: Internal Medicine

## 2023-07-23 VITALS — BP 134/72 | HR 81 | Temp 97.6°F | Resp 17 | Ht 65.0 in | Wt 177.0 lb

## 2023-07-23 DIAGNOSIS — E785 Hyperlipidemia, unspecified: Secondary | ICD-10-CM

## 2023-07-23 DIAGNOSIS — I2510 Atherosclerotic heart disease of native coronary artery without angina pectoris: Secondary | ICD-10-CM

## 2023-07-23 DIAGNOSIS — G3184 Mild cognitive impairment, so stated: Secondary | ICD-10-CM | POA: Diagnosis not present

## 2023-07-23 DIAGNOSIS — R2689 Other abnormalities of gait and mobility: Secondary | ICD-10-CM | POA: Diagnosis not present

## 2023-07-23 DIAGNOSIS — E669 Obesity, unspecified: Secondary | ICD-10-CM | POA: Diagnosis not present

## 2023-07-23 DIAGNOSIS — F419 Anxiety disorder, unspecified: Secondary | ICD-10-CM

## 2023-07-23 DIAGNOSIS — E1169 Type 2 diabetes mellitus with other specified complication: Secondary | ICD-10-CM | POA: Diagnosis not present

## 2023-07-23 DIAGNOSIS — N1832 Chronic kidney disease, stage 3b: Secondary | ICD-10-CM

## 2023-07-23 NOTE — Progress Notes (Signed)
Location:   [facility] of Service:   Clinic  Provider: Mahlon Gammon   Code Status: DNR Goals of Care:     07/23/2023    2:39 PM  Advanced Directives  Does Patient Have a Medical Advance Directive? Yes  Type of Estate agent of [place];Living will;Out of facility DNR (pink MOST or yellow form)  Does patient want to make changes to medical advance directive? No - Patient declined  Copy of Healthcare Power of Attorney in Chart? No - copy requested     Chief Complaint  Patient presents with   New Patient (Initial Visit)    Patient is here to establish care   Immunizations    Patient is due for shingles   Quality Metric Gaps    Patient is due for foot and eye exam     HPI: Patient is a 89 y.o. male seen today for medical management of chronic diseases.     Lives in [place] with his wife Came to establish care He did not have acute complains  He says he had Covid in 2023 and since then he has been feeling weak and still has Post Covid Symptoms NSTEMI From 04/20/2022   coronary artery disease s/p CABG with 6 vessels in 2018, hypertension, diabetes mellitus, CKD stage IIIb, HLD And diagnosis of cognitive impairment and depression by Dr. Frances Furbish  He  is stable. His weight is stable Walks with his walker and Uses His power chair for Long Distance No Falls Wt Readings from Last 3 Encounters:  07/23/23 177 lb (80.3 kg)  07/15/23 177 lb (80.3 kg)  01/07/23 173 lb 3.2 oz (78.6 kg)   Moved to [place] 17 years ago Had [employer] business before that Does have some depression but refuses for Antidepressant  Past Medical History:  Diagnosis Date   Cancer (HCC) 09/10/2002   prostate   Chronic kidney disease, stage 3b (HCC)    Coronary artery disease    Depression    Diabetes mellitus type 2 in obese    Dyslipidemia    Esophageal stricture    Monoclonal paraproteinemia 03/03/2013   Palpitations    RBBB (right bundle branch block)      Past Surgical History:  Procedure Laterality Date   ANKLE SURGERY Left    APPENDECTOMY     CARDIAC CATHETERIZATION     Dr. Swaziland   CATARACT EXTRACTION W/ INTRAOCULAR LENS  IMPLANT, BILATERAL     CORONARY ARTERY BYPASS GRAFT  Oct. 2008   x 5,Dr.Gerhardt   CORONARY/GRAFT ACUTE MI REVASCULARIZATION N/A 04/22/2022   Procedure: Coronary/Graft Acute MI Revascularization;  Surgeon: Swaziland, Peter M, MD;  Location: [facility] INVASIVE CV LAB;  Service: Cardiovascular;  Laterality: N/A;   HERNIA REPAIR Bilateral    LEFT HEART CATH AND CORONARY ANGIOGRAPHY N/A 04/22/2022   Procedure: LEFT HEART CATH AND CORONARY ANGIOGRAPHY;  Surgeon: Swaziland, Peter M, MD;  Location: [facility] INVASIVE CV LAB;  Service: Cardiovascular;  Laterality: N/A;   PROSTATECTOMY  2004   radical   right shoulder      replacement   vein strippping      Allergies  Allergen Reactions   Other Itching     Mepergan Fortis, (meprozine)  = itching (patient disputes this in 2023)    Outpatient Encounter Medications as of 07/23/2023  Medication Sig   amoxicillin (AMOXIL) 500 MG capsule Take 500 mg by mouth 3 (three) times daily.   aspirin EC 81 MG tablet Take 1 tablet (  81 mg total) by mouth daily. Swallow whole.   bismuth subsalicylate (PEPTO BISMOL) 262 MG chewable tablet Chew 524 mg by mouth as needed.   Black Pepper-Turmeric 3-500 MG CAPS Take 1 capsule by mouth every Monday, Wednesday, and Friday.   Cyanocobalamin (VITAMIN B-12 PO) Take 5,000 mcg by mouth at bedtime.   diclofenac Sodium (VOLTAREN) 1 % GEL Apply 4 g topically See admin instructions. Apply to affected area of right wrist 4 times a day   docusate sodium (COLACE) 100 MG capsule Take 100 mg by mouth 2 (two) times daily.   FARXIGA 5 MG TABS tablet Take 5 mg by mouth daily.   fenofibrate (TRICOR) 48 MG tablet Take 1 tablet (48 mg total) by mouth daily.   loratadine (CLARITIN) 10 MG tablet Take 10 mg by mouth daily.   nitroGLYCERIN (NITROSTAT) 0.4 MG SL tablet PLACE 1  TABLET (0.4 MG TOTAL) UNDER THE TONGUE EVERY 5 (FIVE) MINUTES AS NEEDED.   omeprazole (PRILOSEC) 40 MG capsule Take 40 mg by mouth daily before breakfast.   simvastatin (ZOCOR) 20 MG tablet Take 20 mg by mouth at bedtime.     TRADJENTA 5 MG TABS tablet Take 5 mg by mouth daily.   ALPRAZolam (XANAX) 0.5 MG tablet Take 1 tablet (0.5 mg total) by mouth at bedtime as needed for anxiety.   losartan (COZAAR) 25 MG tablet Take 1 tablet (25 mg total) by mouth daily.   polyethylene glycol powder (GLYCOLAX/MIRALAX) 17 GM/SCOOP powder Take 17 g by mouth at bedtime as needed for mild constipation. (Patient not taking: Reported on 07/23/2023)   ZYRTEC ALLERGY 10 MG tablet Take 10 mg by mouth daily as needed for allergies. (Patient not taking: Reported on 07/23/2023)   No facility-administered encounter medications on file as of 07/23/2023.    Review of Systems:  Review of Systems  Constitutional:  Negative for activity change, appetite change and unexpected weight change.  HENT: Negative.    Respiratory:  Negative for cough and shortness of breath.   Cardiovascular:  Negative for leg swelling.  Gastrointestinal:  Negative for constipation.  Genitourinary:  Negative for frequency.  Musculoskeletal:  Positive for gait problem. Negative for arthralgias and myalgias.  Skin: Negative.  Negative for rash.  Neurological:  Negative for dizziness and weakness.  Psychiatric/Behavioral:  Negative for confusion and sleep disturbance.   All other systems reviewed and are negative.   Health Maintenance  Topic Date Due   FOOT EXAM  Never done   OPHTHALMOLOGY EXAM  Never done   Zoster Vaccines- Shingrix (2 of 2) 03/17/2013   HEMOGLOBIN A1C  10/21/2022   COVID-19 Vaccine (5 - 2023-24 season) 08/08/2023 (Originally 05/12/2023)   DTaP/Tdap/Td (3 - Tdap) 07/30/2023   Medicare Annual Wellness (AWV)  11/22/2023   Pneumonia Vaccine 47+ Years old  Completed   INFLUENZA VACCINE  Completed   HPV VACCINES  Aged Out     Physical Exam: Vitals:   07/23/23 1443  BP: 134/72  Pulse: 81  Resp: 17  Temp: 97.6 F (36.4 C)  TempSrc: Temporal  SpO2: 95%  Weight: 177 lb (80.3 kg)  Height: 5\' 5"  (1.651 m)   Body mass index is 29.45 kg/m. Physical Exam Vitals reviewed.  Constitutional:      Appearance: Normal appearance.  HENT:     Head: Normocephalic.     Nose: Nose normal.     Mouth/Throat:     Mouth: Mucous membranes are moist.     Pharynx: Oropharynx is clear.  Eyes:  Pupils: Pupils are equal, round, and reactive to light.  Cardiovascular:     Rate and Rhythm: Normal rate and regular rhythm.     Pulses: Normal pulses.     Heart sounds: No murmur heard. Pulmonary:     Effort: Pulmonary effort is normal. No respiratory distress.     Breath sounds: Normal breath sounds. No rales.  Abdominal:     General: Abdomen is flat. Bowel sounds are normal.     Palpations: Abdomen is soft.  Musculoskeletal:        General: No swelling.     Cervical back: Neck supple.  Skin:    General: Skin is warm.  Neurological:     General: No focal deficit present.     Mental Status: He is alert and oriented to person, place, and time.     Comments: Walks with Shuffling gait And stooped posture  Psychiatric:        Mood and Affect: Mood normal.        Thought Content: Thought content normal.     Labs reviewed: Basic Metabolic Panel: No results for input(s): "NA", "K", "CL", "CO2", "GLUCOSE", "BUN", "CREATININE", "CALCIUM", "MG", "PHOS", "TSH" in the last 8760 hours. Liver Function Tests: No results for input(s): "AST", "ALT", "ALKPHOS", "BILITOT", "PROT", "ALBUMIN" in the last 8760 hours. No results for input(s): "LIPASE", "AMYLASE" in the last 8760 hours. No results for input(s): "AMMONIA" in the last 8760 hours. CBC: No results for input(s): "WBC", "NEUTROABS", "HGB", "HCT", "MCV", "PLT" in the last 8760 hours. Lipid Panel: No results for input(s): "CHOL", "HDL", "LDLCALC", "TRIG", "CHOLHDL",  "LDLDIRECT" in the last 8760 hours. Lab Results  Component Value Date   HGBA1C 8.0 (H) 04/20/2022    Procedures since last visit: No results found.  Assessment/Plan 1. Coronary artery disease  On aspirin and Metoprolol and Statin Repeat Lipid Follows with Dr Swaziland  2. Dyslipidemia Statin and Fenofibrate  3. Anxiety Does not want and Antidepressant Has diagnosed with Depression before 4. MCI (mild cognitive impairment) MMSE 27/30 Appropriate for AL Does repeat Himself 5. Shuffling gait Does have shuffling gait with Stooped Posture No Tremors or Rigidity Has Seen Dr Lucia Gaskins before No Concerns  6. Stage 3b chronic kidney disease (HCC) Repeat Labs  7. Type 2 diabetes mellitus with obesity (HCC) Need Follow up labs On Farxiga and Tradjents   Labs/tests ordered:  CBC,CMP,A1C,Lipid Panel  Mahlon Gammon, MD

## 2023-07-28 ENCOUNTER — Encounter (HOSPITAL_BASED_OUTPATIENT_CLINIC_OR_DEPARTMENT_OTHER): Payer: Self-pay

## 2023-07-28 ENCOUNTER — Other Ambulatory Visit: Payer: Self-pay

## 2023-07-28 ENCOUNTER — Emergency Department (HOSPITAL_BASED_OUTPATIENT_CLINIC_OR_DEPARTMENT_OTHER)
Admission: EM | Admit: 2023-07-28 | Discharge: 2023-07-28 | Disposition: A | Discharge status: Home / Self Care | Payer: PPO | Attending: Emergency Medicine | Admitting: Emergency Medicine

## 2023-07-28 DIAGNOSIS — R42 Dizziness and giddiness: Secondary | ICD-10-CM | POA: Insufficient documentation

## 2023-07-28 DIAGNOSIS — Z7982 Long term (current) use of aspirin: Secondary | ICD-10-CM | POA: Diagnosis not present

## 2023-07-28 LAB — BASIC METABOLIC PANEL
Anion gap: 7 (ref 5–15)
BUN: 28 mg/dL — ABNORMAL HIGH (ref 8–23)
CO2: 25 mmol/L (ref 22–32)
Calcium: 8.6 mg/dL — ABNORMAL LOW (ref 8.9–10.3)
Chloride: 105 mmol/L (ref 98–111)
Creatinine, Ser: 1.57 mg/dL — ABNORMAL HIGH (ref 0.61–1.24)
GFR, Estimated: 42 mL/min — ABNORMAL LOW (ref >60)
Glucose, Bld: 153 mg/dL — ABNORMAL HIGH (ref 70–99)
Potassium: 4.3 mmol/L (ref 3.5–5.1)
Sodium: 137 mmol/L (ref 135–145)

## 2023-07-28 LAB — CBC WITH DIFFERENTIAL/PLATELET
Abs Immature Granulocytes: 0.05 10*3/uL (ref 0.00–0.07)
Basophils Absolute: 0 10*3/uL (ref 0.0–0.1)
Basophils Relative: 0 %
Eosinophils Absolute: 0.3 10*3/uL (ref 0.0–0.5)
Eosinophils Relative: 4 %
HCT: 34.3 % — ABNORMAL LOW (ref 39.0–52.0)
Hemoglobin: 11.5 g/dL — ABNORMAL LOW (ref 13.0–17.0)
Immature Granulocytes: 1 %
Lymphocytes Relative: 20 %
Lymphs Abs: 1.4 10*3/uL (ref 0.7–4.0)
MCH: 30.7 pg (ref 26.0–34.0)
MCHC: 33.5 g/dL (ref 30.0–36.0)
MCV: 91.5 fL (ref 80.0–100.0)
Monocytes Absolute: 0.6 10*3/uL (ref 0.1–1.0)
Monocytes Relative: 8 %
Neutro Abs: 4.9 10*3/uL (ref 1.7–7.7)
Neutrophils Relative %: 67 %
Platelets: 260 10*3/uL (ref 150–400)
RBC: 3.75 MIL/uL — ABNORMAL LOW (ref 4.22–5.81)
RDW: 14.2 % (ref 11.5–15.5)
WBC: 7.2 10*3/uL (ref 4.0–10.5)
nRBC: 0 % (ref 0.0–0.2)

## 2023-07-28 LAB — CBG MONITORING, ED: Glucose-Capillary: 145 mg/dL — ABNORMAL HIGH (ref 70–99)

## 2023-07-28 LAB — URINALYSIS, W/ REFLEX TO CULTURE (INFECTION SUSPECTED)
Bacteria, UA: NONE SEEN
Bilirubin Urine: NEGATIVE
Glucose, UA: >1000 mg/dL — AB
Hgb urine dipstick: NEGATIVE
Ketones, ur: NEGATIVE mg/dL
Leukocytes,Ua: NEGATIVE
Nitrite: NEGATIVE
Protein, ur: NEGATIVE mg/dL
Specific Gravity, Urine: 1.019 (ref 1.005–1.030)
pH: 5.5 (ref 5.0–8.0)

## 2023-07-28 MED ORDER — MECLIZINE HCL 25 MG PO TABS
25.0000 mg | ORAL_TABLET | Freq: Once | ORAL | Status: AC
  Administered 2023-07-28: 25 mg via ORAL
  Filled 2023-07-28: qty 1

## 2023-07-28 MED ORDER — MECLIZINE HCL 25 MG PO TABS: 25.0000 mg | ORAL_TABLET | Freq: Three times a day (TID) | ORAL | 0 refills | Status: DC | PRN

## 2023-07-28 NOTE — Addendum Note (Signed)
Addended by: Cephus Richer on: 07/28/2023 05:47 PM   Modules accepted: Level of Service

## 2023-07-28 NOTE — ED Triage Notes (Signed)
He states he became dizzy yesterday while urinating. As he stood, he felt dizzy and "I just fell". His only c/o pain is at mid occiput, where he has a small area of firm edema. He is alert and articulate. His daughter is with him.

## 2023-07-28 NOTE — ED Provider Notes (Signed)
[place] [facility] AT [facility] Provider Note   CSN: [id number] Arrival date & time: 07/28/23  0800     History  Chief Complaint  Patient presents with   Dizziness   Fall    Tony Lee is a 89 y.o. male.  89 year old male presents with 1 week of intermittent dizziness which has been episodic.  Patient states dizziness feels like room is spinning and is worse with movement of his head.  No prior history of same.  No peripheral weakness appreciated.  States that he was sitting on the toilet and turned suddenly to get the toilet paper became very dizzy and fell.  Struck his head but did not pass out.  Does not take any blood thinners.  Denies any prior history of vertigo.       Home Medications Prior to Admission medications   Medication Sig Start Date End Date Taking? Authorizing Provider  ALPRAZolam Prudy Feeler) 0.5 MG tablet Take 1 tablet (0.5 mg total) by mouth at bedtime as needed for anxiety. 04/30/22   Mahlon Gammon, MD  amoxicillin (AMOXIL) 500 MG capsule Take 500 mg by mouth 3 (three) times daily.    [provider]  aspirin EC 81 MG tablet Take 1 tablet (81 mg total) by mouth daily. Swallow whole. 04/24/22   Ambrose Hoff, MD  bismuth subsalicylate (PEPTO BISMOL) 262 MG chewable tablet Chew 524 mg by mouth as needed.    [provider]  Black Pepper-Turmeric 3-500 MG CAPS Take 1 capsule by mouth every Monday, Wednesday, and Friday.    [provider]  Cyanocobalamin (VITAMIN B-12 PO) Take 5,000 mcg by mouth at bedtime.    [provider]  diclofenac Sodium (VOLTAREN) 1 % GEL Apply 4 g topically See admin instructions. Apply to affected area of right wrist 4 times a day    [provider]  docusate sodium (COLACE) 100 MG capsule Take 100 mg by mouth 2 (two) times daily.    [provider]  FARXIGA 5 MG TABS tablet Take 5 mg by mouth daily. 02/08/21   [provider]  fenofibrate (TRICOR) 48  MG tablet Take 1 tablet (48 mg total) by mouth daily. 01/07/23   Swaziland, Peter M, MD  loratadine (CLARITIN) 10 MG tablet Take 10 mg by mouth daily.    [provider]  losartan (COZAAR) 25 MG tablet Take 1 tablet (25 mg total) by mouth daily. 04/25/22 05/25/22  Ramaj Hoff, MD  nitroGLYCERIN (NITROSTAT) 0.4 MG SL tablet PLACE 1 TABLET (0.4 MG TOTAL) UNDER THE TONGUE EVERY 5 (FIVE) MINUTES AS NEEDED. 06/27/22   Swaziland, Peter M, MD  omeprazole (PRILOSEC) 40 MG capsule Take 40 mg by mouth daily before breakfast. 01/08/13   [provider]  polyethylene glycol powder (GLYCOLAX/MIRALAX) 17 GM/SCOOP powder Take 17 g by mouth at bedtime as needed for mild constipation. Patient not taking: Reported on 07/23/2023    [provider]  simvastatin (ZOCOR) 20 MG tablet Take 20 mg by mouth at bedtime.      [provider]  TRADJENTA 5 MG TABS tablet Take 5 mg by mouth daily. 09/17/15   [provider]  ZYRTEC ALLERGY 10 MG tablet Take 10 mg by mouth daily as needed for allergies. Patient not taking: Reported on 07/23/2023    [provider]      Allergies    Other    Review of Systems   Review of Systems  All other systems reviewed and  are negative.   Physical Exam Updated Vital Signs There were no vitals taken for this visit. Physical Exam Vitals and nursing note reviewed.  Constitutional:      General: He is not in acute distress.    Appearance: Normal appearance. He is well-developed. He is not toxic-appearing.  HENT:     Head: Normocephalic and atraumatic.  Eyes:     General: Lids are normal.     Conjunctiva/sclera: Conjunctivae normal.     Pupils: Pupils are equal, round, and reactive to light.  Neck:     Thyroid: No thyroid mass.     Trachea: No tracheal deviation.  Cardiovascular:     Rate and Rhythm: Normal rate and regular rhythm.     Heart sounds: Normal heart sounds. No murmur heard.    No gallop.  Pulmonary:     Effort: Pulmonary  effort is normal. No respiratory distress.     Breath sounds: Normal breath sounds. No stridor. No decreased breath sounds, wheezing, rhonchi or rales.  Abdominal:     General: There is no distension.     Palpations: Abdomen is soft.     Tenderness: There is no abdominal tenderness. There is no rebound.  Musculoskeletal:        General: No tenderness. Normal range of motion.     Cervical back: Normal range of motion and neck supple.  Skin:    General: Skin is warm and dry.     Findings: No abrasion or rash.  Neurological:     General: No focal deficit present.     Mental Status: He is alert and oriented to person, place, and time. Mental status is at baseline.     GCS: GCS eye subscore is 4. GCS verbal subscore is 5. GCS motor subscore is 6.     Cranial Nerves: Cranial nerves are intact. No cranial nerve deficit.     Sensory: No sensory deficit.     Motor: Motor function is intact.     Comments: Horizontal nystagmus noted  Psychiatric:        Attention and Perception: Attention normal.        Speech: Speech normal.        Behavior: Behavior normal.     ED Results / Procedures / Treatments   Labs (all labs ordered are listed, but only abnormal results are displayed) Labs Reviewed - No data to display  EKG None  ED ECG REPORT   Date: 07/28/2023  Rate: 67  Rhythm: normal sinus rhythm  QRS Axis: normal  Intervals: normal  ST/T Wave abnormalities: nonspecific ST changes  Conduction Disutrbances:rbbb  Narrative Interpretation:   Old EKG Reviewed: unchanged  I have personally reviewed the EKG tracing and agree with the computerized printout as noted.   Radiology No results found.  Procedures Procedures    Medications Ordered in ED Medications  meclizine (ANTIVERT) tablet 25 mg (has no administration in time range)    ED Course/ Medical Decision Making/ A&P                                 Medical Decision Making Amount and/or Complexity of Data  Reviewed Labs: ordered.   Patient with likely peripheral vertigo here.  Given Antivert and feels better.  Labs are reassuring here.  No concern for central vertigo.  Symptoms have been episodic and worse with certain movements.  Will discharge  Final Clinical Impression(s) / ED Diagnoses Final diagnoses:  None    Rx / DC Orders ED Discharge Orders     None         Lorre Nick, MD 07/28/23 0945

## 2023-07-31 ENCOUNTER — Telehealth: Payer: Self-pay | Admitting: Internal Medicine

## 2023-07-31 MED ORDER — MECLIZINE HCL 12.5 MG PO TABS: 12.5000 mg | ORAL_TABLET | Freq: Three times a day (TID) | ORAL | Status: AC | PRN

## 2023-07-31 NOTE — Telephone Encounter (Signed)
Continues to have Dizziness Not taking Meclizine as it makes him feel funny Will write for 12.5 mg Dose to c if it helps with his Dizziness Therapy evaluating also

## 2023-08-01 ENCOUNTER — Encounter: Payer: Self-pay | Admitting: Adult Health

## 2023-08-01 ENCOUNTER — Non-Acute Institutional Stay: Payer: PPO | Admitting: Adult Health

## 2023-08-01 DIAGNOSIS — J300 Vasomotor rhinitis: Secondary | ICD-10-CM

## 2023-08-01 DIAGNOSIS — R42 Dizziness and giddiness: Secondary | ICD-10-CM | POA: Diagnosis not present

## 2023-08-01 MED ORDER — IPRATROPIUM BROMIDE 0.03 % NA SOLN: 2.0000 | Freq: Three times a day (TID) | NASAL | Status: DC

## 2023-08-01 MED ORDER — LOSARTAN POTASSIUM 25 MG PO TABS: 25.0000 mg | ORAL_TABLET | Freq: Every day | ORAL | Status: DC

## 2023-08-01 NOTE — Progress Notes (Signed)
Location:  Medical illustrator of Service:  ALF (13) Provider:   Peggye Ley, ANP [facility] [phone]   Mahlon Gammon, MD  Patient Care Team: Mahlon Gammon, MD as PCP - General (Internal Medicine) Swaziland, Peter M, MD as PCP - Cardiology (Cardiology) Tat, Octaviano Batty, DO as Attending Physician (Neurology) [place]az[place], Peter M, MD as Attending Physician (Cardiology) Exie Parody, MD (Hematology and Oncology) Vida Rigger, MD as Attending Physician (Gastroenterology)  Extended Emergency Contact Information Primary Emergency Contact: Fennell,doug Mobile Phone: [phone] Relation: Son Secondary Emergency Contact: [facility] Phone: [phone] Relation: Daughter  Code Status:  DNR Goals of care: Advanced Directive information    07/28/2023    8:18 AM  Advanced Directives  Does Patient Have a Medical Advance Directive? Yes  Type of Estate agent of [place];Living will  Does patient want to make changes to medical advance directive? No - Patient declined  Copy of Healthcare Power of Attorney in Chart? No - copy requested     Chief Complaint  Patient presents with   Acute Visit    vertigo    HPI:  Pt is a 89 y.o. male seen today for an acute visit for vertigo. He was in the ED 07/28/23 with dizziness in the ER which was episodic. He turned his head quickly on the toilet and felt dizzy and fell. He struck his head. He is not on anticoagulants.   Labs were reassuring in the ED. His exam showed horizontal nystagmus. Symptoms are episodic.   He was ordered meclizine 25 mg and therapy. Therapy has not started. He reports that when he bends or leans over he feels dizzy. When he walks he feels like he is floating on a cloud. No further falls. No nausea. No slurred speech. No peripheral weakness. He does hear a buzzing sound in his ear.  The 25 mg meclizine was causing feelings of grogginess. Dose was reduced to 12.5  but he still does not feel its working and he still felt groggy after taking it.    Orthostatic bp and pulse  Lying 136/71 91 Sitting 125/74 83 Standing 117/73 87   He also reports runny eyes and nose after eating meals that is bothersome. He would like something for this.   Past Medical History:  Diagnosis Date   Cancer (HCC) 09/10/2002   prostate   Chronic kidney disease, stage 3b (HCC)    Coronary artery disease    Depression    Diabetes mellitus type 2 in obese    Dyslipidemia    Esophageal stricture    Monoclonal paraproteinemia 03/03/2013   Palpitations    RBBB (right bundle branch block)    Past Surgical History:  Procedure Laterality Date   ANKLE SURGERY Left    APPENDECTOMY     CARDIAC CATHETERIZATION     Dr. Swaziland   CATARACT EXTRACTION W/ INTRAOCULAR LENS  IMPLANT, BILATERAL     CORONARY ARTERY BYPASS GRAFT  Oct. 2008   x 5,Dr.Gerhardt   CORONARY/GRAFT ACUTE MI REVASCULARIZATION N/A 04/22/2022   Procedure: Coronary/Graft Acute MI Revascularization;  Surgeon: Swaziland, Peter M, MD;  Location: [facility] INVASIVE CV LAB;  Service: Cardiovascular;  Laterality: N/A;   HERNIA REPAIR Bilateral    LEFT HEART CATH AND CORONARY ANGIOGRAPHY N/A 04/22/2022   Procedure: LEFT HEART CATH AND CORONARY ANGIOGRAPHY;  Surgeon: Swaziland, Peter M, MD;  Location: [facility] INVASIVE CV LAB;  Service: Cardiovascular;  Laterality: N/A;   PROSTATECTOMY  2004  radical   right shoulder      replacement   vein strippping      Allergies  Allergen Reactions   Other Itching     Mepergan Fortis, (meprozine)  = itching (patient disputes this in 2023)    Outpatient Encounter Medications as of 08/01/2023  Medication Sig   amoxicillin (AMOXIL) 500 MG capsule Take 2,000 mg by mouth as needed. Prior to dental procedure.   aspirin EC 81 MG tablet Take 1 tablet (81 mg total) by mouth daily. Swallow whole.   bismuth subsalicylate (PEPTO BISMOL) 262 MG chewable tablet Chew 524 mg by mouth as needed.    Cyanocobalamin (VITAMIN B-12 PO) Take 5,000 mcg by mouth at bedtime.   docusate sodium (COLACE) 100 MG capsule Take 100 mg by mouth 2 (two) times daily.   FARXIGA 5 MG TABS tablet Take 5 mg by mouth daily.   fenofibrate (TRICOR) 48 MG tablet Take 1 tablet (48 mg total) by mouth daily.   ipratropium (ATROVENT) 0.03 % nasal spray Place 2 sprays into both nostrils 3 (three) times daily.   loratadine (CLARITIN) 10 MG tablet Take 10 mg by mouth daily.   losartan (COZAAR) 25 MG tablet Take 1 tablet (25 mg total) by mouth daily.   meclizine (ANTIVERT) 12.5 MG tablet Take 1 tablet (12.5 mg total) by mouth 3 (three) times daily as needed for dizziness.   nitroGLYCERIN (NITROSTAT) 0.4 MG SL tablet PLACE 1 TABLET (0.4 MG TOTAL) UNDER THE TONGUE EVERY 5 (FIVE) MINUTES AS NEEDED.   omeprazole (PRILOSEC) 40 MG capsule Take 40 mg by mouth daily before breakfast.   simvastatin (ZOCOR) 20 MG tablet Take 20 mg by mouth at bedtime.     TRADJENTA 5 MG TABS tablet Take 5 mg by mouth daily.   Black Pepper-Turmeric 3-500 MG CAPS Take 1 capsule by mouth every Monday, Wednesday, and Friday.   diclofenac Sodium (VOLTAREN) 1 % GEL Apply 4 g topically See admin instructions. Apply to affected area of right wrist 4 times a day   [DISCONTINUED] losartan (COZAAR) 25 MG tablet Take 1 tablet (25 mg total) by mouth daily.   No facility-administered encounter medications on file as of 08/01/2023.    Review of Systems  Constitutional:  Positive for activity change. Negative for appetite change, chills, diaphoresis, fatigue, fever and unexpected weight change.  HENT:  Positive for hearing loss and tinnitus. Negative for congestion, ear discharge, ear pain, facial swelling, mouth sores, nosebleeds, postnasal drip, rhinorrhea, sinus pressure, sneezing, sore throat, trouble swallowing and voice change.   Respiratory:  Negative for cough, shortness of breath, wheezing and stridor.   Cardiovascular:  Negative for chest pain,  palpitations and leg swelling.  Gastrointestinal:  Negative for abdominal distention, abdominal pain, constipation and diarrhea.  Genitourinary:  Negative for difficulty urinating and dysuria.  Musculoskeletal:  Positive for gait problem. Negative for arthralgias, back pain, joint swelling and myalgias.  Neurological:  Positive for dizziness. Negative for seizures, syncope, facial asymmetry, speech difficulty, weakness and headaches.  Hematological:  Negative for adenopathy. Does not bruise/bleed easily.  Psychiatric/Behavioral:  Negative for agitation, behavioral problems and confusion.     Immunization History  Administered Date(s) Administered   Influenza-Unspecified 06/05/2021, 07/02/2023   Moderna Sars-Covid-2 Vaccination 09/22/2019, 10/20/2019, 07/26/2020, 06/27/2021   PNEUMOCOCCAL CONJUGATE-20 06/29/2014   Pneumococcal Conjugate-13 06/21/2013   Pneumococcal-Unspecified 08/12/2013   Td 07/29/2009, 07/29/2013   Zoster Recombinant(Shingrix) 01/20/2013   Pertinent  Health Maintenance Due  Topic Date Due   FOOT EXAM  Never  done   OPHTHALMOLOGY EXAM  Never done   HEMOGLOBIN A1C  10/21/2022   INFLUENZA VACCINE  Completed      04/21/2022    9:00 AM 04/22/2022    7:45 AM 04/22/2022   10:00 PM 04/23/2022   10:00 AM 07/23/2023    2:39 PM  Fall Risk  Falls in the past year?     0  Was there an injury with Fall?     0  Fall Risk Category Calculator     0  (RETIRED) Patient Fall Risk Level High fall risk High fall risk High fall risk High fall risk   Patient at Risk for Falls Due to     Impaired balance/gait;Impaired mobility  Fall risk Follow up     Falls evaluation completed   Functional Status Survey:    Vitals:   08/01/23 1509  BP: 127/75  Pulse: 86  Resp: 16  Temp: (!) 97.2 F (36.2 C)  SpO2: 97%   There is no height or weight on file to calculate BMI. Physical Exam Vitals and nursing note reviewed.  Constitutional:      General: He is not in acute distress.     Appearance: He is not diaphoretic.  HENT:     Head: Normocephalic and atraumatic.     Right Ear: Tympanic membrane and ear canal normal.     Left Ear: Tympanic membrane and ear canal normal.     Nose: Nose normal.     Mouth/Throat:     Mouth: Mucous membranes are moist.     Pharynx: Oropharynx is clear.  Eyes:     Extraocular Movements: Extraocular movements intact.     Conjunctiva/sclera: Conjunctivae normal.     Pupils: Pupils are equal, round, and reactive to light.  Neck:     Thyroid: No thyromegaly.     Vascular: No JVD.     Trachea: No tracheal deviation.  Cardiovascular:     Rate and Rhythm: Normal rate and regular rhythm.     Heart sounds: No murmur heard. Pulmonary:     Effort: Pulmonary effort is normal. No respiratory distress.     Breath sounds: Normal breath sounds. No wheezing.  Abdominal:     General: Bowel sounds are normal. There is no distension.     Palpations: Abdomen is soft.     Tenderness: There is no abdominal tenderness.  Musculoskeletal:     Right lower leg: Edema present.     Left lower leg: Edema present.  Lymphadenopathy:     Cervical: No cervical adenopathy.  Skin:    General: Skin is warm and dry.  Neurological:     General: No focal deficit present.     Mental Status: He is alert and oriented to person, place, and time. Mental status is at baseline.     Cranial Nerves: No cranial nerve deficit.  Psychiatric:        Mood and Affect: Mood normal.     Labs reviewed: Recent Labs    07/28/23 0831  NA 137  K 4.3  CL 105  CO2 25  GLUCOSE 153*  BUN 28*  CREATININE 1.57*  CALCIUM 8.6*   No results for input(s): "AST", "ALT", "ALKPHOS", "BILITOT", "PROT", "ALBUMIN" in the last 8760 hours. Recent Labs    07/28/23 0831  WBC 7.2  NEUTROABS 4.9  HGB 11.5*  HCT 34.3*  MCV 91.5  PLT 260   Lab Results  Component Value Date   TSH 3.233 04/21/2022   Lab  Results  Component Value Date   HGBA1C 8.0 (H) 04/20/2022   Lab Results   Component Value Date   CHOL 105 04/21/2022   HDL 37 (L) 04/21/2022   LDLCALC 42 04/21/2022   TRIG 132 04/21/2022   CHOLHDL 2.8 04/21/2022    Significant Diagnostic Results in last 30 days:  No results found.  Assessment/Plan 1. Vertigo Symptoms seem to be peripheral in nature with horizontal nystagmus.  No  weakness, no cranial nerve deficit. Normal ear exam. PT eval has been ordered but not complete (highly recommended) He does not feel the meclizine is helping but does make him groggy.  Will leave on the med list for now in case symptoms are worsening.   2. Vasomotor rhinitis  - ipratropium (ATROVENT) 0.03 % nasal spray; Place 2 sprays into both nostrils 3 (three) times daily.    Family/ staff Communication: resident and nurse.   Labs/tests ordered:  NA   Total time :  time greater than 50% of total time spent doing pt counseling and coordination of care

## 2023-08-13 ENCOUNTER — Encounter: Payer: Self-pay | Admitting: Internal Medicine

## 2023-08-13 ENCOUNTER — Non-Acute Institutional Stay: Payer: PPO | Admitting: Internal Medicine

## 2023-08-13 VITALS — BP 136/80 | HR 73 | Temp 98.0°F | Ht 65.0 in | Wt 174.2 lb

## 2023-08-13 DIAGNOSIS — R198 Other specified symptoms and signs involving the digestive system and abdomen: Secondary | ICD-10-CM | POA: Diagnosis not present

## 2023-08-13 DIAGNOSIS — G3184 Mild cognitive impairment, so stated: Secondary | ICD-10-CM

## 2023-08-13 DIAGNOSIS — R42 Dizziness and giddiness: Secondary | ICD-10-CM

## 2023-08-13 NOTE — Progress Notes (Signed)
Location: [facility] features editor of Service:  Clinic (12)  Provider:   Code Status: DNR Goals of Care:     08/13/2023    2:00 PM  Advanced Directives  Does Patient Have a Medical Advance Directive? Yes  Type of Estate agent of [place];Living will;Out of facility DNR (pink MOST or yellow form)  Does patient want to make changes to medical advance directive? No - Patient declined  Copy of Healthcare Power of Attorney in Chart? No - copy requested     Chief Complaint  Patient presents with   Hospitalization Follow-up    Hospital Follow up from fall. Discharged 07/28/23   Quality Metric Gaps    To discuss need for zoster, covid, tdap, Foot exam, Eye exam and A1C    HPI: Patient is a 89 y.o. male seen today for an acute visit for Follow up    Lives in [place] with his [place]   Patient was recently diagnosed with vertigo after he fell and started having dizziness.  He did not tolerate 25 mg of meclizine so the dose was changed to 12.5 mg.   His symptoms of dizziness are now mostly resolved Again over the Thanksgiving week patient started having diarrhea.  He asked for Imodium and got 2 doses of Imodium.  But then patient got constipated and asked for laxative and the nurses gave him MiraLAX and MOM with prune juice Next day After that patient had a huge episode of stool incontinence He was very upset after that.  And got another dose of Imodium. Today he was overall upset about his episodes of diarrhea and constipation.  He is not having any nausea vomiting abdominal pain It seems patient is having some cognition issues also per Nurses  Other issues NSTEMI From 04/20/2022   coronary artery disease s/p CABG with 6 vessels in 2018, hypertension, diabetes mellitus, CKD stage IIIb, HLD And diagnosis of cognitive impairment and depression by Dr. Frances Furbish   Past Medical History:  Diagnosis Date   Cancer (HCC) 09/10/2002   prostate   Chronic  kidney disease, stage 3b (HCC)    Coronary artery disease    Depression    Diabetes mellitus type 2 in obese    Dyslipidemia    Esophageal stricture    Monoclonal paraproteinemia 03/03/2013   Palpitations    RBBB (right bundle branch block)     Past Surgical History:  Procedure Laterality Date   ANKLE SURGERY Left    APPENDECTOMY     CARDIAC CATHETERIZATION     Dr. Swaziland   CATARACT EXTRACTION W/ INTRAOCULAR LENS  IMPLANT, BILATERAL     CORONARY ARTERY BYPASS GRAFT  Oct. 2008   x 5,Dr.Gerhardt   CORONARY/GRAFT ACUTE MI REVASCULARIZATION N/A 04/22/2022   Procedure: Coronary/Graft Acute MI Revascularization;  Surgeon: Swaziland, Peter M, MD;  Location: [facility] INVASIVE CV LAB;  Service: Cardiovascular;  Laterality: N/A;   HERNIA REPAIR Bilateral    LEFT HEART CATH AND CORONARY ANGIOGRAPHY N/A 04/22/2022   Procedure: LEFT HEART CATH AND CORONARY ANGIOGRAPHY;  Surgeon: Swaziland, Peter M, MD;  Location: [facility] INVASIVE CV LAB;  Service: Cardiovascular;  Laterality: N/A;   PROSTATECTOMY  2004   radical   right shoulder      replacement   vein strippping      Allergies  Allergen Reactions   Other Itching     Mepergan Fortis, (meprozine)  = itching (patient disputes this in 2023)    Outpatient Encounter Medications as  of 08/13/2023  Medication Sig   amoxicillin (AMOXIL) 500 MG capsule Take 2,000 mg by mouth as needed. Prior to dental procedure.   aspirin EC 81 MG tablet Take 1 tablet (81 mg total) by mouth daily. Swallow whole.   bismuth subsalicylate (PEPTO BISMOL) 262 MG chewable tablet Chew 524 mg by mouth as needed.   Black Pepper-Turmeric 3-500 MG CAPS Take 1 capsule by mouth every Monday, Wednesday, and Friday.   Cyanocobalamin (VITAMIN B-12 PO) Take 5,000 mcg by mouth at bedtime.   diclofenac Sodium (VOLTAREN) 1 % GEL Apply 4 g topically See admin instructions. Apply to affected area of right wrist 4 times a day   docusate sodium (COLACE) 100 MG capsule Take 100 mg by mouth 2 (two) times  daily.   FARXIGA 5 MG TABS tablet Take 5 mg by mouth daily.   fenofibrate (TRICOR) 48 MG tablet Take 1 tablet (48 mg total) by mouth daily.   ipratropium (ATROVENT) 0.03 % nasal spray Place 2 sprays into both nostrils 3 (three) times daily.   loperamide (IMODIUM) 2 MG capsule Take 2 mg by mouth as needed for diarrhea or loose stools.   loratadine (CLARITIN) 10 MG tablet Take 10 mg by mouth daily.   losartan (COZAAR) 25 MG tablet Take 1 tablet (25 mg total) by mouth daily.   meclizine (ANTIVERT) 12.5 MG tablet Take 1 tablet (12.5 mg total) by mouth 3 (three) times daily as needed for dizziness.   nitroGLYCERIN (NITROSTAT) 0.4 MG SL tablet PLACE 1 TABLET (0.4 MG TOTAL) UNDER THE TONGUE EVERY 5 (FIVE) MINUTES AS NEEDED.   omeprazole (PRILOSEC) 40 MG capsule Take 40 mg by mouth 2 (two) times daily.   polyethylene glycol (MIRALAX / GLYCOLAX) 17 g packet Take 17 g by mouth daily as needed.   simvastatin (ZOCOR) 20 MG tablet Take 20 mg by mouth at bedtime.     TRADJENTA 5 MG TABS tablet Take 5 mg by mouth daily.   No facility-administered encounter medications on file as of 08/13/2023.    Review of Systems:  Review of Systems  Constitutional:  Negative for activity change, appetite change and unexpected weight change.  HENT: Negative.    Respiratory:  Negative for cough and shortness of breath.   Cardiovascular:  Negative for leg swelling.  Gastrointestinal:  Positive for constipation and diarrhea.  Genitourinary:  Negative for frequency.  Musculoskeletal:  Positive for gait problem. Negative for arthralgias and myalgias.  Skin: Negative.  Negative for rash.  Neurological:  Positive for dizziness. Negative for weakness.  Psychiatric/Behavioral:  Positive for confusion. Negative for sleep disturbance.   All other systems reviewed and are negative.   Health Maintenance  Topic Date Due   FOOT EXAM  Never done   OPHTHALMOLOGY EXAM  Never done   Zoster Vaccines- Shingrix (2 of 2) 03/17/2013    HEMOGLOBIN A1C  10/21/2022   COVID-19 Vaccine (5 - 2023-24 season) 05/12/2023   DTaP/Tdap/Td (3 - Tdap) 07/30/2023   Medicare Annual Wellness (AWV)  11/22/2023   Pneumonia Vaccine 68+ Years old  Completed   INFLUENZA VACCINE  Completed   HPV VACCINES  Aged Out    Physical Exam: Vitals:   08/13/23 1356  BP: 136/80  Pulse: 73  Temp: 98 F (36.7 C)  SpO2: 97%  Weight: 174 lb 3.2 oz (79 kg)  Height: 5\' 5"  (1.651 m)   Body mass index is 28.99 kg/m. Physical Exam Vitals reviewed.  Constitutional:      Appearance: Normal appearance.  HENT:     Head: Normocephalic.     Nose: Nose normal.     Mouth/Throat:     Mouth: Mucous membranes are moist.     Pharynx: Oropharynx is clear.  Eyes:     Pupils: Pupils are equal, round, and reactive to light.  Cardiovascular:     Rate and Rhythm: Normal rate and regular rhythm.     Pulses: Normal pulses.     Heart sounds: No murmur heard. Pulmonary:     Effort: Pulmonary effort is normal. No respiratory distress.     Breath sounds: Normal breath sounds. No rales.  Abdominal:     General: Abdomen is flat. Bowel sounds are normal.     Palpations: Abdomen is soft.  Musculoskeletal:        General: No swelling.     Cervical back: Neck supple.  Skin:    General: Skin is warm.  Neurological:     General: No focal deficit present.     Mental Status: He is alert and oriented to person, place, and time.  Psychiatric:        Mood and Affect: Mood normal.        Thought Content: Thought content normal.     Labs reviewed: Basic Metabolic Panel: Recent Labs    07/28/23 0831  NA 137  K 4.3  CL 105  CO2 25  GLUCOSE 153*  BUN 28*  CREATININE 1.57*  CALCIUM 8.6*   Liver Function Tests: No results for input(s): "AST", "ALT", "ALKPHOS", "BILITOT", "PROT", "ALBUMIN" in the last 8760 hours. No results for input(s): "LIPASE", "AMYLASE" in the last 8760 hours. No results for input(s): "AMMONIA" in the last 8760 hours. CBC: Recent Labs     07/28/23 0831  WBC 7.2  NEUTROABS 4.9  HGB 11.5*  HCT 34.3*  MCV 91.5  PLT 260   Lipid Panel: No results for input(s): "CHOL", "HDL", "LDLCALC", "TRIG", "CHOLHDL", "LDLDIRECT" in the last 8760 hours. Lab Results  Component Value Date   HGBA1C 8.0 (H) 04/20/2022    Procedures since last visit: No results found.  Assessment/Plan 1. Alternating constipation and diarrhea He was very upset about the whole week on holiday that her was having this issue Discussed this with the supervising nurse .  Then patient is very insistent and asked for Imodium and MiraLAX. I reinforced that patient should not be given these medications at this is the main cause of her problem.  2. Vertigo He is using as needed meclizine which is helping  3. MCI (mild cognitive impairment) MMSE 27/30 Appropriate for [place] Does repeat Himself  Other issues Coronary artery disease  On aspirin and Metoprolol and Statin Follows with Dr [place]aziland   2. Dyslipidemia Statin and Fenofibrate    Anxiety Does not want and Antidepressant Has diagnosed with Depression before  Shuffling gait Does have shuffling gait with Stooped Posture No Tremors or Rigidity Has Seen Dr Lucia Gaskins before No Concerns   Stage 3b chronic kidney disease (HCC)    Labs/tests ordered:  * No order type specified * Next appt:  11/04/2023

## 2023-08-20 DIAGNOSIS — R2689 Other abnormalities of gait and mobility: Secondary | ICD-10-CM | POA: Diagnosis not present

## 2023-08-20 DIAGNOSIS — Z9181 History of falling: Secondary | ICD-10-CM | POA: Diagnosis not present

## 2023-08-20 DIAGNOSIS — M6259 Muscle wasting and atrophy, not elsewhere classified, multiple sites: Secondary | ICD-10-CM | POA: Diagnosis not present

## 2023-08-20 DIAGNOSIS — R42 Dizziness and giddiness: Secondary | ICD-10-CM | POA: Diagnosis not present

## 2023-11-04 ENCOUNTER — Non-Acute Institutional Stay: Payer: PPO | Admitting: Adult Health

## 2023-11-04 ENCOUNTER — Encounter: Payer: Self-pay | Admitting: Adult Health

## 2023-11-04 VITALS — BP 132/76 | HR 77 | Temp 97.7°F | Resp 16 | Ht 65.0 in | Wt 166.0 lb

## 2023-11-04 DIAGNOSIS — E1129 Type 2 diabetes mellitus with other diabetic kidney complication: Secondary | ICD-10-CM | POA: Diagnosis not present

## 2023-11-04 DIAGNOSIS — R42 Dizziness and giddiness: Secondary | ICD-10-CM

## 2023-11-04 DIAGNOSIS — R198 Other specified symptoms and signs involving the digestive system and abdomen: Secondary | ICD-10-CM | POA: Diagnosis not present

## 2023-11-04 DIAGNOSIS — D519 Vitamin B12 deficiency anemia, unspecified: Secondary | ICD-10-CM | POA: Diagnosis not present

## 2023-11-04 DIAGNOSIS — N1832 Chronic kidney disease, stage 3b: Secondary | ICD-10-CM | POA: Diagnosis not present

## 2023-11-04 DIAGNOSIS — E1122 Type 2 diabetes mellitus with diabetic chronic kidney disease: Secondary | ICD-10-CM | POA: Diagnosis not present

## 2023-11-04 DIAGNOSIS — I10 Essential (primary) hypertension: Secondary | ICD-10-CM | POA: Diagnosis not present

## 2023-11-04 DIAGNOSIS — I2510 Atherosclerotic heart disease of native coronary artery without angina pectoris: Secondary | ICD-10-CM | POA: Diagnosis not present

## 2023-11-04 DIAGNOSIS — E782 Mixed hyperlipidemia: Secondary | ICD-10-CM | POA: Diagnosis not present

## 2023-11-04 LAB — BASIC METABOLIC PANEL WITH GFR
BUN: 27 — AB (ref 4–21)
CO2: 24 — AB (ref 13–22)
Chloride: 104 (ref 99–108)
Creatinine: 1.4 — AB (ref 0.6–1.3)
Glucose: 140
Potassium: 4.2 meq/L (ref 3.5–5.1)
Sodium: 138 (ref 137–147)

## 2023-11-04 LAB — CBC AND DIFFERENTIAL
HCT: 33 — AB (ref 41–53)
Hemoglobin: 11.1 — AB (ref 13.5–17.5)
Platelets: 276 10*3/uL (ref 150–400)
WBC: 7.2

## 2023-11-04 LAB — LIPID PANEL
Cholesterol: 96 (ref 0–200)
HDL: 44 (ref 35–70)
LDL Cholesterol: 42
Triglycerides: 52 (ref 40–160)

## 2023-11-04 LAB — COMPREHENSIVE METABOLIC PANEL WITH GFR
Albumin: 3.4 — AB (ref 3.5–5.0)
Calcium: 8.8 (ref 8.7–10.7)
Globulin: 2.3
eGFR: 48

## 2023-11-04 LAB — HEMOGLOBIN A1C: Hemoglobin A1C: 7.9

## 2023-11-04 LAB — HEPATIC FUNCTION PANEL
ALT: 10 U/L (ref 10–40)
AST: 14 (ref 14–40)
Alkaline Phosphatase: 46 (ref 25–125)
Bilirubin, Total: 0.3

## 2023-11-04 LAB — CBC: RBC: 3.54 — AB (ref 3.87–5.11)

## 2023-11-04 NOTE — Progress Notes (Signed)
 Location: [facility]   POS:  clinic  Provider:  Peggye Ley, ANP [facility] [phone]   Code Status: DNR and has most form Goals of Care:     11/04/2023    1:58 PM  Advanced Directives  Does Patient Have a Medical Advance Directive? Yes  Type of Estate agent of [place];Living will;Out of facility DNR (pink MOST or yellow form)  Does patient want to make changes to medical advance directive? No - Patient declined  Copy of Healthcare Power of Attorney in Chart? No - copy requested     Chief Complaint  Patient presents with   Medical Management of Chronic Issues    Patient is here for a 3 month follow up    Immunizations    Patient is due for a shingles, covid, tdap    Health Maintenance    Patient is due for a foot exam     HPI: Patient is a 89 y.o. male seen today for medical management of chronic diseases.    PMH significant for CAD MI s/p CABG in 2018, HTN, DM II, CKD, HLD, MCI.   He experiences dizziness and fatigue, with low stamina necessitating the use of a motorized chair for longer distances. He can walk with a walker but feels dizzy, especially when turning over in bed or standing up quickly. The dizziness comes and goes and is worse this week.Marland Kitchen He was previously diagnosed with peripheral vertigo and prescribed meclizine, which he takes occasionally with relief. He was seen in the emergency room in November 2024, but physical therapy did not improve his symptoms.  He reports a weight loss from 185-189 pounds two years ago to 166 pounds currently. He was not actively trying to lose weight but attributes it to managing his diabetes and sugar intake.  He has a history of constipation and diarrhea, which is now under control. He takes Colace twice a day and Miralax as needed, though he rarely needs it.  He notes some swelling in his ankles, which he considers typical. He has a history of playing football, which he believes  contributes to the difference in muscle mass and size between his legs.  He is on losartan for blood pressure, Farxiga and Tradjenta for diabetes, and simvastatin for cholesterol.  He had a left heart cath 04/22/22 showed 3 vessel obstructive CAD with 70% stenosis of the LAD. Also severe stenosis of the PL branch distal to graft which was the culprit lesion. He had a succesful PCI of the PL brach of the RCA with DES x 1. Cardiology recommended DAPT with ASA and Brilinta for 12 months. They recommend treating the LAD medically. Also could have further PCI for refractory symptoms.  2 D echo showed EF 60-65% with grade 1 DD Dr. Antoine Poche notes indicate holding off on starting a BB  He does have a hx of MCI and prior to this event lived in [place]. Remains ambulatory with  a walker with shuffling slow gait, also has a tremor.  MMSE 27/30 11/17/22.  Has seen Dr Lucia Gaskins and neuropsych.     Past Medical History:  Diagnosis Date   Cancer (HCC) 09/10/2002   prostate   Chronic kidney disease, stage 3b (HCC)    Coronary artery disease    Depression    Diabetes mellitus type 2 in obese    Dyslipidemia    Esophageal stricture    Monoclonal paraproteinemia 03/03/2013   Palpitations    RBBB (right bundle branch block)  Past Surgical History:  Procedure Laterality Date   ANKLE SURGERY Left    APPENDECTOMY     CARDIAC CATHETERIZATION     Dr. Swaziland   CATARACT EXTRACTION W/ INTRAOCULAR LENS  IMPLANT, BILATERAL     CORONARY ARTERY BYPASS GRAFT  Oct. 2008   x 5,Dr.Gerhardt   CORONARY/GRAFT ACUTE MI REVASCULARIZATION N/A 04/22/2022   Procedure: Coronary/Graft Acute MI Revascularization;  Surgeon: Swaziland, Peter M, MD;  Location: [facility] INVASIVE CV LAB;  Service: Cardiovascular;  Laterality: N/A;   HERNIA REPAIR Bilateral    LEFT HEART CATH AND CORONARY ANGIOGRAPHY N/A 04/22/2022   Procedure: LEFT HEART CATH AND CORONARY ANGIOGRAPHY;  Surgeon: Swaziland, Peter M, MD;  Location: [facility] INVASIVE CV LAB;  Service:  Cardiovascular;  Laterality: N/A;   PROSTATECTOMY  2004   radical   right shoulder      replacement   vein strippping      Allergies  Allergen Reactions   Other Itching     Mepergan Fortis, (meprozine)  = itching (patient disputes this in 2023)    Outpatient Encounter Medications as of 11/04/2023  Medication Sig   aspirin EC 81 MG tablet Take 1 tablet (81 mg total) by mouth daily. Swallow whole.   bismuth subsalicylate (PEPTO BISMOL) 262 MG chewable tablet Chew 524 mg by mouth as needed.   Black Pepper-Turmeric 3-500 MG CAPS Take 1 capsule by mouth every Monday, Wednesday, and Friday.   Cyanocobalamin (VITAMIN B-12 PO) Take 5,000 mcg by mouth at bedtime.   diclofenac Sodium (VOLTAREN) 1 % GEL Apply 4 g topically See admin instructions. Apply to affected area of right wrist 4 times a day   docusate sodium (COLACE) 100 MG capsule Take 100 mg by mouth 2 (two) times daily.   FARXIGA 5 MG TABS tablet Take 5 mg by mouth daily.   fenofibrate (TRICOR) 48 MG tablet Take 1 tablet (48 mg total) by mouth daily.   ipratropium (ATROVENT) 0.03 % nasal spray Place 2 sprays into both nostrils 3 (three) times daily.   loratadine (CLARITIN) 10 MG tablet Take 10 mg by mouth daily.   losartan (COZAAR) 25 MG tablet Take 12.5 mg by mouth daily. Take 12.5mg  in the morning   meclizine (ANTIVERT) 12.5 MG tablet Take 1 tablet (12.5 mg total) by mouth 3 (three) times daily as needed for dizziness.   nitroGLYCERIN (NITROSTAT) 0.4 MG SL tablet PLACE 1 TABLET (0.4 MG TOTAL) UNDER THE TONGUE EVERY 5 (FIVE) MINUTES AS NEEDED.   omeprazole (PRILOSEC) 40 MG capsule Take 40 mg by mouth 2 (two) times daily.   polyethylene glycol (MIRALAX / GLYCOLAX) 17 g packet Take 17 g by mouth daily as needed.   simvastatin (ZOCOR) 20 MG tablet Take 20 mg by mouth at bedtime.     TRADJENTA 5 MG TABS tablet Take 5 mg by mouth daily.   amoxicillin (AMOXIL) 500 MG capsule Take 2,000 mg by mouth as needed. Prior to dental procedure.  (Patient not taking: Reported on 11/04/2023)   loperamide (IMODIUM) 2 MG capsule Take 2 mg by mouth as needed for diarrhea or loose stools. (Patient not taking: Reported on 11/04/2023)   [DISCONTINUED] losartan (COZAAR) 25 MG tablet Take 1 tablet (25 mg total) by mouth daily.   No facility-administered encounter medications on file as of 11/04/2023.    Review of Systems:  Review of Systems  Constitutional:  Negative for activity change, appetite change, chills, diaphoresis, fatigue, fever and unexpected weight change.  Respiratory:  Negative for cough, shortness of  breath, wheezing and stridor.   Cardiovascular:  Negative for chest pain, palpitations and leg swelling.  Gastrointestinal:  Negative for abdominal distention, abdominal pain, constipation and diarrhea.  Genitourinary:  Negative for difficulty urinating and dysuria.  Musculoskeletal:  Positive for gait problem. Negative for arthralgias, back pain, joint swelling and myalgias.  Neurological:  Positive for dizziness. Negative for seizures, syncope, facial asymmetry, speech difficulty, weakness and headaches.  Hematological:  Negative for adenopathy. Does not bruise/bleed easily.  Psychiatric/Behavioral:  Negative for agitation, behavioral problems and confusion.     Health Maintenance  Topic Date Due   FOOT EXAM  Never done   OPHTHALMOLOGY EXAM  Never done   Zoster Vaccines- Shingrix (2 of 2) 03/17/2013   HEMOGLOBIN A1C  10/21/2022   COVID-19 Vaccine (5 - 2024-25 season) 05/12/2023   DTaP/Tdap/Td (3 - Tdap) 07/30/2023   Medicare Annual Wellness (AWV)  11/22/2023   Pneumonia Vaccine 6+ Years old  Completed   INFLUENZA VACCINE  Completed   HPV VACCINES  Aged Out    Physical Exam: Vitals:   11/04/23 1357  Pulse: 77  Resp: 16  Temp: 97.7 F (36.5 C)  TempSrc: Temporal  SpO2: 96%  Height: 5\' 5"  (1.651 m)   Body mass index is 28.99 kg/m. Physical Exam Vitals and nursing note reviewed.  Constitutional:      General:  He is not in acute distress.    Appearance: He is not diaphoretic.  HENT:     Head: Normocephalic and atraumatic.     Right Ear: Tympanic membrane normal.     Left Ear: Tympanic membrane normal.     Nose: Nose normal.     Mouth/Throat:     Mouth: Mucous membranes are moist.     Pharynx: Oropharynx is clear.  Eyes:     Extraocular Movements: Extraocular movements intact.     Conjunctiva/sclera: Conjunctivae normal.     Pupils: Pupils are equal, round, and reactive to light.  Neck:     Thyroid: No thyromegaly.     Vascular: No JVD.     Trachea: No tracheal deviation.  Cardiovascular:     Rate and Rhythm: Normal rate and regular rhythm.     Heart sounds: No murmur heard. Pulmonary:     Effort: Pulmonary effort is normal. No respiratory distress.     Breath sounds: Normal breath sounds. No wheezing.  Abdominal:     General: Bowel sounds are normal. There is no distension.     Palpations: Abdomen is soft.     Tenderness: There is no abdominal tenderness.  Musculoskeletal:     Right lower leg: Edema (+1) present.     Left lower leg: Edema (trace) present.  Lymphadenopathy:     Cervical: No cervical adenopathy.  Skin:    General: Skin is warm and dry.  Neurological:     General: No focal deficit present.     Mental Status: He is alert and oriented to person, place, and time. Mental status is at baseline.     Cranial Nerves: No cranial nerve deficit.  Psychiatric:        Mood and Affect: Mood normal.     Labs reviewed: Basic Metabolic Panel: Recent Labs    07/28/23 0831  NA 137  K 4.3  CL 105  CO2 25  GLUCOSE 153*  BUN 28*  CREATININE 1.57*  CALCIUM 8.6*   Liver Function Tests: No results for input(s): "AST", "ALT", "ALKPHOS", "BILITOT", "PROT", "ALBUMIN" in the last 8760 hours. No results  for input(s): "LIPASE", "AMYLASE" in the last 8760 hours. No results for input(s): "AMMONIA" in the last 8760 hours. CBC: Recent Labs    07/28/23 0831  WBC 7.2  NEUTROABS  4.9  HGB 11.5*  HCT 34.3*  MCV 91.5  PLT 260   Lipid Panel: No results for input(s): "CHOL", "HDL", "LDLCALC", "TRIG", "CHOLHDL", "LDLDIRECT" in the last 8760 hours. Lab Results  Component Value Date   HGBA1C 8.0 (H) 04/20/2022    Procedures since last visit: No results found.  Assessment/Plan  Vertigo Chronic, intermittent dizziness, worse with positional changes. . Meclizine 12.5mg  provides some relief. -Continue Meclizine 12.5mg  as needed for dizziness. -Consider further evaluation if symptoms worsen.  Fatigue Low stamina, possibly related to cardiac history. Patient uses motorized chair for longer distances to conserve energy. -Await results of blood work to assess for anemia or electrolyte imbalances.  Constipation Managed with Colace 2x daily and Miralax as needed. -Continue current regimen.  Diabetes Managed with [place] and Tradjenta. -Check A1c prior to next apt.   Hyperlipidemia Managed with Simvastatin. -Continue current regimen.  Hypertension Managed with Losartan. -Continue current regimen.  General Health Maintenance -Order Tdap vaccine. -Check thyroid function at next lab draw due to weight loss. -Recommend annual eye exam.  CKD Lab Results  Component Value Date   BUN 28 (H) 07/28/2023   Lab Results  Component Value Date   CREATININE 1.57 (H) 07/28/2023   Continue to periodically monitor BMP and avoid nephrotoxic agents    Labs/tests ordered:  * No order type specified * A1C TSH BMP prior to next apt Next appt: 4 months  Total time :  time greater than 50% of total time spent doing pt counseling and coordination of care   Labs drawn for this visit are still pending at the close of this note.

## 2023-11-08 DIAGNOSIS — H903 Sensorineural hearing loss, bilateral: Secondary | ICD-10-CM | POA: Diagnosis not present

## 2023-11-26 DIAGNOSIS — H52203 Unspecified astigmatism, bilateral: Secondary | ICD-10-CM | POA: Diagnosis not present

## 2023-11-26 DIAGNOSIS — H5000 Unspecified esotropia: Secondary | ICD-10-CM | POA: Diagnosis not present

## 2023-11-26 DIAGNOSIS — Z961 Presence of intraocular lens: Secondary | ICD-10-CM | POA: Diagnosis not present

## 2023-11-26 DIAGNOSIS — H353132 Nonexudative age-related macular degeneration, bilateral, intermediate dry stage: Secondary | ICD-10-CM | POA: Diagnosis not present

## 2023-11-26 LAB — HM DIABETES EYE EXAM

## 2023-11-27 ENCOUNTER — Encounter: Payer: Self-pay | Admitting: Internal Medicine

## 2023-11-28 NOTE — Telephone Encounter (Signed)
 This encounter was created in error - please disregard.

## 2023-12-18 ENCOUNTER — Encounter (HOSPITAL_BASED_OUTPATIENT_CLINIC_OR_DEPARTMENT_OTHER): Payer: Self-pay

## 2023-12-18 ENCOUNTER — Emergency Department (HOSPITAL_BASED_OUTPATIENT_CLINIC_OR_DEPARTMENT_OTHER)

## 2023-12-18 ENCOUNTER — Emergency Department (HOSPITAL_BASED_OUTPATIENT_CLINIC_OR_DEPARTMENT_OTHER)
Admission: EM | Admit: 2023-12-18 | Discharge: 2023-12-18 | Disposition: A | Discharge status: Home / Self Care | Attending: Emergency Medicine | Admitting: Emergency Medicine

## 2023-12-18 DIAGNOSIS — Y92511 Restaurant or cafe as the place of occurrence of the external cause: Secondary | ICD-10-CM | POA: Diagnosis not present

## 2023-12-18 DIAGNOSIS — I2510 Atherosclerotic heart disease of native coronary artery without angina pectoris: Secondary | ICD-10-CM | POA: Diagnosis not present

## 2023-12-18 DIAGNOSIS — S0990XA Unspecified injury of head, initial encounter: Secondary | ICD-10-CM | POA: Diagnosis not present

## 2023-12-18 DIAGNOSIS — S50311A Abrasion of right elbow, initial encounter: Secondary | ICD-10-CM

## 2023-12-18 DIAGNOSIS — Z7982 Long term (current) use of aspirin: Secondary | ICD-10-CM | POA: Diagnosis not present

## 2023-12-18 DIAGNOSIS — I499 Cardiac arrhythmia, unspecified: Secondary | ICD-10-CM | POA: Diagnosis not present

## 2023-12-18 DIAGNOSIS — W01198A Fall on same level from slipping, tripping and stumbling with subsequent striking against other object, initial encounter: Secondary | ICD-10-CM | POA: Diagnosis not present

## 2023-12-18 DIAGNOSIS — Y9301 Activity, walking, marching and hiking: Secondary | ICD-10-CM | POA: Diagnosis not present

## 2023-12-18 DIAGNOSIS — E1122 Type 2 diabetes mellitus with diabetic chronic kidney disease: Secondary | ICD-10-CM | POA: Diagnosis not present

## 2023-12-18 DIAGNOSIS — R55 Syncope and collapse: Secondary | ICD-10-CM | POA: Diagnosis not present

## 2023-12-18 DIAGNOSIS — N183 Chronic kidney disease, stage 3 unspecified: Secondary | ICD-10-CM | POA: Diagnosis not present

## 2023-12-18 DIAGNOSIS — W19XXXA Unspecified fall, initial encounter: Secondary | ICD-10-CM

## 2023-12-18 DIAGNOSIS — R42 Dizziness and giddiness: Secondary | ICD-10-CM | POA: Diagnosis not present

## 2023-12-18 DIAGNOSIS — R9089 Other abnormal findings on diagnostic imaging of central nervous system: Secondary | ICD-10-CM | POA: Diagnosis not present

## 2023-12-18 DIAGNOSIS — S59901A Unspecified injury of right elbow, initial encounter: Secondary | ICD-10-CM | POA: Diagnosis present

## 2023-12-18 DIAGNOSIS — I6782 Cerebral ischemia: Secondary | ICD-10-CM | POA: Diagnosis not present

## 2023-12-18 DIAGNOSIS — I6529 Occlusion and stenosis of unspecified carotid artery: Secondary | ICD-10-CM | POA: Diagnosis not present

## 2023-12-18 LAB — CBC WITH DIFFERENTIAL/PLATELET
Abs Immature Granulocytes: 0.05 10*3/uL (ref 0.00–0.07)
Basophils Absolute: 0 10*3/uL (ref 0.0–0.1)
Basophils Relative: 0 %
Eosinophils Absolute: 0.3 10*3/uL (ref 0.0–0.5)
Eosinophils Relative: 3 %
HCT: 34.3 % — ABNORMAL LOW (ref 39.0–52.0)
Hemoglobin: 11.3 g/dL — ABNORMAL LOW (ref 13.0–17.0)
Immature Granulocytes: 1 %
Lymphocytes Relative: 15 %
Lymphs Abs: 1.3 10*3/uL (ref 0.7–4.0)
MCH: 30.1 pg (ref 26.0–34.0)
MCHC: 32.9 g/dL (ref 30.0–36.0)
MCV: 91.5 fL (ref 80.0–100.0)
Monocytes Absolute: 0.7 10*3/uL (ref 0.1–1.0)
Monocytes Relative: 8 %
Neutro Abs: 6.3 10*3/uL (ref 1.7–7.7)
Neutrophils Relative %: 73 %
Platelets: 276 10*3/uL (ref 150–400)
RBC: 3.75 MIL/uL — ABNORMAL LOW (ref 4.22–5.81)
RDW: 13.4 % (ref 11.5–15.5)
WBC: 8.6 10*3/uL (ref 4.0–10.5)
nRBC: 0 % (ref 0.0–0.2)

## 2023-12-18 LAB — COMPREHENSIVE METABOLIC PANEL WITH GFR
ALT: 7 U/L (ref 0–44)
AST: 11 U/L — ABNORMAL LOW (ref 15–41)
Albumin: 3.6 g/dL (ref 3.5–5.0)
Alkaline Phosphatase: 46 U/L (ref 38–126)
Anion gap: 7 (ref 5–15)
BUN: 30 mg/dL — ABNORMAL HIGH (ref 8–23)
CO2: 27 mmol/L (ref 22–32)
Calcium: 9 mg/dL (ref 8.9–10.3)
Chloride: 101 mmol/L (ref 98–111)
Creatinine, Ser: 1.53 mg/dL — ABNORMAL HIGH (ref 0.61–1.24)
GFR, Estimated: 43 mL/min — ABNORMAL LOW (ref >60)
Glucose, Bld: 149 mg/dL — ABNORMAL HIGH (ref 70–99)
Potassium: 4.2 mmol/L (ref 3.5–5.1)
Sodium: 135 mmol/L (ref 135–145)
Total Bilirubin: 0.4 mg/dL (ref 0.0–1.2)
Total Protein: 5.9 g/dL — ABNORMAL LOW (ref 6.5–8.1)

## 2023-12-18 LAB — TROPONIN I (HIGH SENSITIVITY): Troponin I (High Sensitivity): 10 ng/L (ref <18)

## 2023-12-18 NOTE — Discharge Instructions (Signed)
 As we talked about, there was an infrequent pause on the heart monitor tonight. We discussed this with Dr. Tresa Endo (cardiology on-call for Dr. Swaziland) who advised this could be further evaluated in the office. Please call Dr. Elvis Coil office tomorrow to schedule a time to be seen in the office over the next 1-2 days to consider a heart monitor for further evaluation of irregular heart rhythm seen and discussed tonight.  A urine specimen was not collected tonight. It is recommended that you have this checked with your doctor - they might be able to do this at [facility].   Return to the ED with any high fever, confusion, pain, headache, shortness of breath or any new symptoms of concern.

## 2023-12-18 NOTE — ED Provider Notes (Signed)
 [place] [facility] DEPARTMENT AT [facility] Provider Note   CSN: [id number] Arrival date & time: 12/18/23  1341     History  Chief Complaint  Patient presents with   Tony Lee is a 89 y.o. male.  Patient to ED after fall while walking with a walker, leaving a restaurant. He was with wife and 2 daughters who feel he might have been confused/dizzy prior to the fall. Wife felt he tried to turn and lost control of the walker. Daughter, Lawson Fiscal, says there was a nurse on site who reported his pulse was low after the fall. The patient denies chest pain, abdominal pain, nausea, headache, neck pain. He reports a bleeding sore on his right elbow but no other extremity injury.   The history is provided by the patient, the spouse and a relative. No language interpreter was used.  Fall       Home Medications Prior to Admission medications   Medication Sig Start Date End Date Taking? Authorizing Provider  amoxicillin (AMOXIL) 500 MG capsule Take 2,000 mg by mouth as needed. Prior to dental procedure.    [provider]  aspirin EC 81 MG tablet Take 1 tablet (81 mg total) by mouth daily. Swallow whole. 04/24/22   Felipe Hoff, MD  bismuth subsalicylate (PEPTO BISMOL) 262 MG chewable tablet Chew 524 mg by mouth as needed.    [provider]  Black Pepper-Turmeric 3-500 MG CAPS Take 1 capsule by mouth every Monday, Wednesday, and Friday.    [provider]  Cyanocobalamin (VITAMIN B-12 PO) Take 5,000 mcg by mouth at bedtime.    [provider]  diclofenac Sodium (VOLTAREN) 1 % GEL Apply 4 g topically See admin instructions. Apply to affected area of right wrist 4 times a day    [provider]  docusate sodium (COLACE) 100 MG capsule Take 100 mg by mouth 2 (two) times daily.    [provider]  FARXIGA 5 MG TABS tablet Take 5 mg by mouth daily. 02/08/21   [provider]  fenofibrate (TRICOR) 48 MG tablet Take 1  tablet (48 mg total) by mouth daily. 01/07/23   [place], Peter M, MD  ipratropium (ATROVENT) 0.03 % nasal spray Place 2 sprays into both nostrils 3 (three) times daily. 08/01/23   Fletcher Anon, NP  loratadine (CLARITIN) 10 MG tablet Take 10 mg by mouth daily.    [provider]  losartan (COZAAR) 25 MG tablet Take 12.5 mg by mouth daily. Take 12.5mg  in the morning    [provider]  meclizine (ANTIVERT) 12.5 MG tablet Take 1 tablet (12.5 mg total) by mouth 3 (three) times daily as needed for dizziness. 07/31/23   Mahlon Gammon, MD  nitroGLYCERIN (NITROSTAT) 0.4 MG SL tablet PLACE 1 TABLET (0.4 MG TOTAL) UNDER THE TONGUE EVERY 5 (FIVE) MINUTES AS NEEDED. 06/27/22   [place], Peter M, MD  omeprazole (PRILOSEC) 40 MG capsule Take 40 mg by mouth 2 (two) times daily. 01/08/13   [provider]  polyethylene glycol (MIRALAX / GLYCOLAX) 17 g packet Take 17 g by mouth daily as needed.    [provider]  simvastatin (ZOCOR) 20 MG tablet Take 20 mg by mouth at bedtime.      [provider]  TRADJENTA 5 MG TABS tablet Take 5 mg by mouth daily. 09/17/15   [provider]      Allergies    Other    Review of Systems  Review of Systems  Physical Exam Updated Vital Signs BP (!) 120/50 (BP Location: Left Arm)   Pulse 75   Temp (!) 97.5 F (36.4 C) (Oral)   Resp 17   Ht 5\' 7"  (1.702 m)   Wt 76.7 kg   SpO2 99%   BMI 26.47 kg/m  Physical Exam Vitals and nursing note reviewed.  Constitutional:      Appearance: He is well-developed.  HENT:     Head: Normocephalic and atraumatic.     Right Ear: Tympanic membrane normal.     Left Ear: Tympanic membrane normal.     Nose: Nose normal.     Mouth/Throat:     Mouth: Mucous membranes are moist.  Eyes:     Pupils: Pupils are equal, round, and reactive to light.  Neck:     Comments: Mld left paracervical tenderness without midline cervical tenderness. ROM neck.  Cardiovascular:     Rate and Rhythm:  Normal rate and regular rhythm.     Heart sounds: No murmur heard. Pulmonary:     Effort: Pulmonary effort is normal.     Breath sounds: Normal breath sounds. No wheezing, rhonchi or rales.  Chest:     Chest wall: No tenderness.  Abdominal:     Palpations: Abdomen is soft.     Tenderness: There is no abdominal tenderness. There is no guarding or rebound.  Musculoskeletal:        General: Normal range of motion.     Cervical back: Normal range of motion and neck supple.     Comments: FROM all extremities without bony abnormality.  Skin:    General: Skin is warm and dry.  Neurological:     General: No focal deficit present.     Mental Status: He is alert and oriented to person, place, and time.     ED Results / Procedures / Treatments   Labs (all labs ordered are listed, but only abnormal results are displayed) Labs Reviewed  CBC WITH DIFFERENTIAL/PLATELET - Abnormal; Notable for the following components:      Result Value   RBC 3.75 (*)    Hemoglobin 11.3 (*)    HCT 34.3 (*)    All other components within normal limits  COMPREHENSIVE METABOLIC PANEL WITH GFR - Abnormal; Notable for the following components:   Glucose, Bld 149 (*)    BUN 30 (*)    Creatinine, Ser 1.53 (*)    Total Protein 5.9 (*)    AST 11 (*)    GFR, Estimated 43 (*)    All other components within normal limits  URINALYSIS, ROUTINE W REFLEX MICROSCOPIC  TROPONIN I (HIGH SENSITIVITY)  TROPONIN I (HIGH SENSITIVITY)   Results for orders placed or performed during the hospital encounter of 12/18/23  CBC with Differential   Collection Time: 12/18/23  3:32 PM  Result Value Ref Range   WBC 8.6 4.0 - 10.5 K/uL   RBC 3.75 (L) 4.22 - 5.81 MIL/uL   Hemoglobin 11.3 (L) 13.0 - 17.0 g/dL   HCT 16.1 (L) 09.6 - 04.5 %   MCV 91.5 80.0 - 100.0 fL   MCH 30.1 26.0 - 34.0 pg   MCHC 32.9 30.0 - 36.0 g/dL   RDW 40.9 81.1 - 91.4 %   Platelets 276 150 - 400 K/uL   nRBC 0.0 0.0 - 0.2 %   Neutrophils Relative % 73 %    Neutro Abs 6.3 1.7 - 7.7 K/uL   Lymphocytes Relative 15 %  Lymphs Abs 1.3 0.7 - 4.0 K/uL   Monocytes Relative 8 %   Monocytes Absolute 0.7 0.1 - 1.0 K/uL   Eosinophils Relative 3 %   Eosinophils Absolute 0.3 0.0 - 0.5 K/uL   Basophils Relative 0 %   Basophils Absolute 0.0 0.0 - 0.1 K/uL   Immature Granulocytes 1 %   Abs Immature Granulocytes 0.05 0.00 - 0.07 K/uL  Comprehensive metabolic panel   Collection Time: 12/18/23  3:32 PM  Result Value Ref Range   Sodium 135 135 - 145 mmol/L   Potassium 4.2 3.5 - 5.1 mmol/L   Chloride 101 98 - 111 mmol/L   CO2 27 22 - 32 mmol/L   Glucose, Bld 149 (H) 70 - 99 mg/dL   BUN 30 (H) 8 - 23 mg/dL   Creatinine, Ser 4.54 (H) 0.61 - 1.24 mg/dL   Calcium 9.0 8.9 - 09.8 mg/dL   Total Protein 5.9 (L) 6.5 - 8.1 g/dL   Albumin 3.6 3.5 - 5.0 g/dL   AST 11 (L) 15 - 41 U/L   ALT 7 0 - 44 U/L   Alkaline Phosphatase 46 38 - 126 U/L   Total Bilirubin 0.4 0.0 - 1.2 mg/dL   GFR, Estimated 43 (L) >60 mL/min   Anion gap 7 5 - 15  Troponin I (High Sensitivity)   Collection Time: 12/18/23  3:32 PM  Result Value Ref Range   Troponin I (High Sensitivity) 10 <18 ng/L    EKG EKG Interpretation Date/Time:  Wednesday December 18 2023 14:22:46 EDT Ventricular Rate:  69 PR Interval:  296 QRS Duration:  143 QT Interval:  402 QTC Calculation: 431 R Axis:   49  Text Interpretation: Sinus or ectopic atrial rhythm Sinus pause Prolonged PR interval Right bundle branch block Confirmed by Virgina Norfolk [phone]) on 12/18/2023 2:27:23 PM  Radiology CT Head Wo Contrast Result Date: 12/18/2023 CLINICAL DATA:  Head trauma, fall from standing, dizziness. EXAM: CT HEAD WITHOUT CONTRAST CT CERVICAL SPINE WITHOUT CONTRAST TECHNIQUE: Multidetector CT imaging of the head and cervical spine was performed following the standard protocol without intravenous contrast. Multiplanar CT image reconstructions of the cervical spine were also generated. RADIATION DOSE REDUCTION: This exam was  performed according to the departmental dose-optimization program which includes automated exposure control, adjustment of the mA and/or kV according to patient size and/or use of iterative reconstruction technique. COMPARISON:  None Available. FINDINGS: CT HEAD FINDINGS Brain: No acute intracranial hemorrhage. No CT evidence of acute infarct. Nonspecific hypoattenuation in the periventricular and subcortical white matter favored to reflect chronic microvascular ischemic changes. Mild-to-moderate generalized parenchymal volume loss. No edema, mass effect, or midline shift. The basilar cisterns are patent. Ventricles: Prominence of the ventricles suggesting underlying parenchymal volume loss. Vascular: Atherosclerotic calcifications of the carotid siphons and intracranial vertebral arteries. No hyperdense vessel. Skull: No acute or aggressive finding. Orbits: Orbits are symmetric. Sinuses: The visualized paranasal sinuses are clear. Other: Mastoid air cells are clear. CT CERVICAL SPINE FINDINGS Alignment: Straightening and slight reversal of the normal cervical lordosis. Trace anterolisthesis of C7 on T1 likely related to facet degenerative changes. No facet subluxation or dislocation. Skull base and vertebrae: No compression fracture or displaced fracture in the cervical spine. No suspicious osseous lesions. Soft tissues and spinal canal: No prevertebral fluid or swelling. No visible canal hematoma. Disc levels: Intervertebral disc space narrowing at multiple levels. Disc osteophyte complexes throughout the cervical spine. No high-grade osseous spinal canal stenosis. Facet arthrosis and uncovertebral hypertrophy at multiple levels. Foraminal  narrowing is most pronounced on the left at C3-4 through C5-6. Upper chest: Negative. Other: Partially visualized sequelae of median sternotomy. Partially visualized right shoulder arthroplasty hardware. IMPRESSION: No CT evidence of acute intracranial abnormality. No acute  fracture or traumatic malalignment of the cervical spine. Chronic and degenerative changes as above. Electronically Signed   By: Emily Filbert M.D.   On: 12/18/2023 17:42   CT Cervical Spine Wo Contrast Result Date: 12/18/2023 CLINICAL DATA:  Head trauma, fall from standing, dizziness. EXAM: CT HEAD WITHOUT CONTRAST CT CERVICAL SPINE WITHOUT CONTRAST TECHNIQUE: Multidetector CT imaging of the head and cervical spine was performed following the standard protocol without intravenous contrast. Multiplanar CT image reconstructions of the cervical spine were also generated. RADIATION DOSE REDUCTION: This exam was performed according to the departmental dose-optimization program which includes automated exposure control, adjustment of the mA and/or kV according to patient size and/or use of iterative reconstruction technique. COMPARISON:  None Available. FINDINGS: CT HEAD FINDINGS Brain: No acute intracranial hemorrhage. No CT evidence of acute infarct. Nonspecific hypoattenuation in the periventricular and subcortical white matter favored to reflect chronic microvascular ischemic changes. Mild-to-moderate generalized parenchymal volume loss. No edema, mass effect, or midline shift. The basilar cisterns are patent. Ventricles: Prominence of the ventricles suggesting underlying parenchymal volume loss. Vascular: Atherosclerotic calcifications of the carotid siphons and intracranial vertebral arteries. No hyperdense vessel. Skull: No acute or aggressive finding. Orbits: Orbits are symmetric. Sinuses: The visualized paranasal sinuses are clear. Other: Mastoid air cells are clear. CT CERVICAL SPINE FINDINGS Alignment: Straightening and slight reversal of the normal cervical lordosis. Trace anterolisthesis of C7 on T1 likely related to facet degenerative changes. No facet subluxation or dislocation. Skull base and vertebrae: No compression fracture or displaced fracture in the cervical spine. No suspicious osseous lesions.  Soft tissues and spinal canal: No prevertebral fluid or swelling. No visible canal hematoma. Disc levels: Intervertebral disc space narrowing at multiple levels. Disc osteophyte complexes throughout the cervical spine. No high-grade osseous spinal canal stenosis. Facet arthrosis and uncovertebral hypertrophy at multiple levels. Foraminal narrowing is most pronounced on the left at C3-4 through C5-6. Upper chest: Negative. Other: Partially visualized sequelae of median sternotomy. Partially visualized right shoulder arthroplasty hardware. IMPRESSION: No CT evidence of acute intracranial abnormality. No acute fracture or traumatic malalignment of the cervical spine. Chronic and degenerative changes as above. Electronically Signed   By: Emily Filbert M.D.   On: 12/18/2023 17:42    Procedures Procedures    Medications Ordered in ED Medications - No data to display  ED Course/ Medical Decision Making/ A&P Clinical Course as of 12/18/23 1832  Wed Dec 18, 2023  1424 Multiple attempts were made to contact [facility] where the patient is a resident in assisted living. Multiple transfers from reception all to voice mail, unsuccessful at confirming the patient's medications.  [SU]    Clinical Course User Index [SU] Elpidio Anis, PA-C                                 Medical Decision Making This patient presents to the ED for concern of fall, this involves an extensive number of treatment options, and is a complaint that carries with it a high risk of complications and morbidity.  The differential diagnosis includes syncope, mechanical fall, neurologic event, infection/weakness   Co morbidities that complicate the patient evaluation  Chronic RBBB, CAD, HLD, T2DM, monoclonal paraproteinemia, CKD stage 3  Additional history obtained:  Additional history and/or information obtained from chart review, notable for Cardiology note ([place]) 11/4 - f/u CAD, last cath 2023 with patent previous stents,  new lesion successfully stented.    Lab Tests:  I Ordered, and personally interpreted labs.  The pertinent results include:  BUN 30, Cr 1.53, c/w baseline; WBC 8.6, hgb 11.3;     Imaging Studies ordered:  I ordered imaging studies including Head and neck CT Per radiologist interpretation: no acute injuries   Cardiac Monitoring:  The patient was maintained on a cardiac monitor.  I personally viewed and interpreted the cardiac monitored which showed an underlying rhythm of: RBBB (chronic), prolonged PR (chronic). Infrequent 1 1/2 sec pause    Test Considered:  N/a   Critical Interventions:  N/a   Consultations Obtained:  I requested consultation with the cardiology, Dr. Tresa Endo,  and discussed lab and imaging findings as well as pertinent plan - they recommend: doubtful the infrequent pauses caused the fall today. From cardiology standpoint, can be discharged home with close in-office follow up to consider holter monitor.   Problem List / ED Course:  Fall, ? Near syncope vs mechanical fall No chest pain No recent fever, illness, denies SOB, nausea, headache Labs stable. Urine ordered but patient unable to provide specimen EKG showing infrequent pauses (1.5 seconds) without hypotension or drop in HR Discussed with cardiology Tresa Endo) Head and neck CT unremarkable.  The patient was ambulated, had no lightheadedness, was steady and gait was baseline per family members at bedside.  He is stable for discharge home.    Reevaluation:  After the interventions noted above, I reevaluated the patient and found that they have :improved   Social Determinants of Health:  Lives with wife in assisted living - [facility]   Disposition:  After consideration of the diagnostic results and the patients response to treatment, I feel that the patient would benefit from discharge home.   Amount and/or Complexity of Data Reviewed Labs: ordered. Radiology:  ordered.           Final Clinical Impression(s) / ED Diagnoses Final diagnoses:  Fall, initial encounter  Abrasion of right elbow, initial encounter  Cardiac arrhythmia, unspecified cardiac arrhythmia type    Rx / DC Orders ED Discharge Orders     None         Elpidio Anis, PA-C 12/18/23 1832    Virgina Norfolk, DO 12/18/23 2141

## 2023-12-18 NOTE — ED Triage Notes (Signed)
 In for eval of fall from standing. Reports some dizziness prior to fall. Wound to right elbow, mild bleeding. Reports hitting posterior right head. No bleeding noted. Negative LOC. Denies neck pain.

## 2023-12-19 ENCOUNTER — Other Ambulatory Visit: Payer: Self-pay | Admitting: Cardiology

## 2023-12-19 ENCOUNTER — Telehealth: Payer: Self-pay | Admitting: Cardiovascular Disease

## 2023-12-19 ENCOUNTER — Ambulatory Visit: Attending: Cardiology

## 2023-12-19 DIAGNOSIS — R002 Palpitations: Secondary | ICD-10-CM

## 2023-12-19 DIAGNOSIS — N390 Urinary tract infection, site not specified: Secondary | ICD-10-CM | POA: Diagnosis not present

## 2023-12-19 NOTE — Telephone Encounter (Signed)
 Patient c/o Palpitations:  STAT if patient reporting lightheadedness, shortness of breath, or chest pain  How long have you had palpitations/irregular HR/ Afib? Are you having the symptoms now? Yesterday, not sure  Are you currently experiencing lightheadedness, SOB or CP? Not sure  Do you have a history of afib (atrial fibrillation) or irregular heart rhythm? no  Have you checked your BP or HR? (document readings if available): no  Are you experiencing any other symptoms? No Went to ER [employer]

## 2023-12-19 NOTE — Progress Notes (Unsigned)
 Enrolled for [employer] to mail a ZIO XT long term holter monitor to the patients address on file.  Requested monitor be delivered 12/26/23.

## 2023-12-19 NOTE — Telephone Encounter (Signed)
 Spoke to patient and home health RN, Dr.Jordan advised to wear 2 week heart monitor (Zio).Advised monitor will be mailed to your home with instructions.

## 2023-12-19 NOTE — Telephone Encounter (Signed)
 Patient identification verified by 2 forms. Tony Rail, RN    Called and spoke to patients son Tony Lee states:   -patient presented to ED yesterday after a fall   -unsure what caused the fall   -in ED patient was sent by cardiology who noted irregular heart beat   -cardiology recommended patient have a heart monitor   -in ED patient heart was skipping beats   -patient currently stay at [facility]  Informed Tony Lee message sent to Dr. [place] for assistance with monitor request  Tony Lee verbalized understanding, no questions at this time

## 2023-12-31 ENCOUNTER — Encounter: Payer: Self-pay | Admitting: Internal Medicine

## 2023-12-31 ENCOUNTER — Non-Acute Institutional Stay (INDEPENDENT_AMBULATORY_CARE_PROVIDER_SITE_OTHER): Admitting: Internal Medicine

## 2023-12-31 VITALS — BP 126/68 | HR 80 | Temp 98.2°F | Resp 21 | Ht 67.0 in | Wt 166.0 lb

## 2023-12-31 DIAGNOSIS — F32A Depression, unspecified: Secondary | ICD-10-CM

## 2023-12-31 DIAGNOSIS — G3184 Mild cognitive impairment, so stated: Secondary | ICD-10-CM

## 2023-12-31 DIAGNOSIS — W19XXXS Unspecified fall, sequela: Secondary | ICD-10-CM | POA: Diagnosis not present

## 2023-12-31 DIAGNOSIS — R42 Dizziness and giddiness: Secondary | ICD-10-CM

## 2023-12-31 DIAGNOSIS — N1832 Chronic kidney disease, stage 3b: Secondary | ICD-10-CM | POA: Diagnosis not present

## 2023-12-31 NOTE — Progress Notes (Signed)
 Location: [facility] features editor of Service:  Clinic (12)  Provider:   Code Status: DNR Goals of Care:     11/04/2023    1:58 PM  Advanced Directives  Does Patient Have a Medical Advance Directive? Yes  Type of Estate agent of [place];Living will;Out of facility DNR (pink MOST or yellow form)  Does patient want to make changes to medical advance directive? No - Patient declined  Copy of Healthcare Power of Attorney in Chart? No - copy requested     Chief Complaint  Patient presents with   Acute Visit    HPI: Patient is a 89 y.o. male seen today for an acute visit for Cognition Worsening per his daughter also S/P Fall  Lives in [place] in [place] with his wife  He has h/o NSTEMI From 04/20/2022   coronary artery disease s/p CABG with 6 vessels in 2018, hypertension, diabetes mellitus, CKD stage IIIb, HLD And diagnosis of cognitive impairment and depression by Dr. Omar Bibber    Discussed the use of AI scribe software for clinical note transcription with the patient, who gave verbal consent to proceed.  History of Present Illness    He was with his family in [place]  where he slipped and fell sideways between two doors. He denies any loss of consciousness or feeling dizzy prior to the fall, but reports injuring his elbow in the process. In ED EKG showed? Pauses Cardiology now has Zio Monitor on him now f or 2 weeks CT head showed Atrophy but no Acute process  Discharged back to facility Daughter noticed that he is more paranoid and Forgetful since then   Since the fall, the patient reports a general decline in his health and well-being.      He says since then he feels 'cloudiness' and 'waviness', which is exacerbated when he moves his gaze around the room. He denies any associated nausea or vomiting. The patient also reports a significant decline in his memory since the fall, stating that he 'used to remember everything' but now 'hardly  remember anything'.   The patient also mentions a lack of control over his bladder, requiring the use of incontinence pants, but denies any associated pain or burning sensation during urination.    Past Medical History:  Diagnosis Date   Cancer (HCC) 09/10/2002   prostate   Chronic kidney disease, stage 3b (HCC)    Coronary artery disease    Depression    Diabetes mellitus type 2 in obese    Dyslipidemia    Esophageal stricture    Monoclonal paraproteinemia 03/03/2013   Palpitations    RBBB (right bundle branch block)     Past Surgical History:  Procedure Laterality Date   ANKLE SURGERY Left    APPENDECTOMY     CARDIAC CATHETERIZATION     Dr. Swaziland   CATARACT EXTRACTION W/ INTRAOCULAR LENS  IMPLANT, BILATERAL     CORONARY ARTERY BYPASS GRAFT  Oct. 2008   x 5,Dr.Gerhardt   CORONARY/GRAFT ACUTE MI REVASCULARIZATION N/A 04/22/2022   Procedure: Coronary/Graft Acute MI Revascularization;  Surgeon: Swaziland, Peter M, MD;  Location: [facility] INVASIVE CV LAB;  Service: Cardiovascular;  Laterality: N/A;   HERNIA REPAIR Bilateral    LEFT HEART CATH AND CORONARY ANGIOGRAPHY N/A 04/22/2022   Procedure: LEFT HEART CATH AND CORONARY ANGIOGRAPHY;  Surgeon: Swaziland, Peter M, MD;  Location: [facility] INVASIVE CV LAB;  Service: Cardiovascular;  Laterality: N/A;   PROSTATECTOMY  2004   radical  right shoulder      replacement   vein strippping      Allergies  Allergen Reactions   Other Itching     Mepergan Fortis, (meprozine)  = itching (patient disputes this in 2023)    Outpatient Encounter Medications as of 12/31/2023  Medication Sig   aspirin  EC 81 MG tablet Take 1 tablet (81 mg total) by mouth daily. Swallow whole.   bismuth subsalicylate (PEPTO BISMOL) 262 MG chewable tablet Chew 524 mg by mouth as needed.   Black Pepper-Turmeric 3-500 MG CAPS Take 1 capsule by mouth every Monday, Wednesday, and Friday.   Cyanocobalamin (VITAMIN B-12 PO) Take 5,000 mcg by mouth at bedtime.   diclofenac Sodium  (VOLTAREN) 1 % GEL Apply 4 g topically See admin instructions. Apply to affected area of right wrist 4 times a day   docusate sodium  (COLACE) 100 MG capsule Take 100 mg by mouth 2 (two) times daily.   FARXIGA 5 MG TABS tablet Take 5 mg by mouth daily.   fenofibrate  (TRICOR ) 48 MG tablet Take 1 tablet (48 mg total) by mouth daily.   ipratropium (ATROVENT ) 0.03 % nasal spray Place 2 sprays into both nostrils 3 (three) times daily.   loratadine (CLARITIN) 10 MG tablet Take 10 mg by mouth daily.   losartan  (COZAAR ) 25 MG tablet Take 12.5 mg by mouth daily. Take 12.5mg  in the morning   meclizine  (ANTIVERT ) 12.5 MG tablet Take 1 tablet (12.5 mg total) by mouth 3 (three) times daily as needed for dizziness.   Multiple Vitamins-Minerals (PRESERVISION AREDS 2) CAPS Take 1 capsule by mouth. 1 capsule, oral, Twice a day   nitroGLYCERIN  (NITROSTAT ) 0.4 MG SL tablet PLACE 1 TABLET (0.4 MG TOTAL) UNDER THE TONGUE EVERY 5 (FIVE) MINUTES AS NEEDED.   omeprazole (PRILOSEC) 40 MG capsule Take 40 mg by mouth 2 (two) times daily.   polyethylene glycol (MIRALAX  / GLYCOLAX ) 17 g packet Take 17 g by mouth daily as needed.   simvastatin  (ZOCOR ) 20 MG tablet Take 20 mg by mouth at bedtime.     TRADJENTA  5 MG TABS tablet Take 5 mg by mouth daily.   amoxicillin (AMOXIL) 500 MG capsule Take 2,000 mg by mouth as needed. Prior to dental procedure. (Patient not taking: Reported on 12/31/2023)   No facility-administered encounter medications on file as of 12/31/2023.    Review of Systems:  Review of Systems  Constitutional:  Positive for activity change. Negative for appetite change and unexpected weight change.  HENT: Negative.    Respiratory:  Negative for cough and shortness of breath.   Cardiovascular:  Negative for leg swelling.  Gastrointestinal:  Negative for constipation.  Genitourinary:  Negative for frequency.  Musculoskeletal:  Positive for gait problem. Negative for arthralgias and myalgias.  Skin: Negative.   Negative for rash.  Neurological:  Negative for dizziness and weakness.  Psychiatric/Behavioral:  Positive for confusion. Negative for sleep disturbance.   All other systems reviewed and are negative.   Health Maintenance  Topic Date Due   Zoster Vaccines- Shingrix (2 of 2) 03/17/2013   HEMOGLOBIN A1C  10/21/2022   COVID-19 Vaccine (5 - 2024-25 season) 05/12/2023   DTaP/Tdap/Td (3 - Tdap) 07/30/2023   Medicare Annual Wellness (AWV)  11/22/2023   INFLUENZA VACCINE  04/10/2024   FOOT EXAM  11/03/2024   OPHTHALMOLOGY EXAM  11/25/2024   Pneumonia Vaccine 35+ Years old  Completed   HPV VACCINES  Aged Out   Meningococcal B Vaccine  Aged Out  Physical Exam: Vitals:   12/31/23 0810  BP: 126/68  Pulse: 80  Resp: (!) 21  Temp: 98.2 F (36.8 C)  SpO2: 98%  Weight: 166 lb (75.3 kg)  Height: 5\' 7"  (1.702 m)   Body mass index is 26 kg/m. Physical Exam Vitals reviewed.  Constitutional:      Appearance: Normal appearance.  HENT:     Head: Normocephalic.     Nose: Nose normal.     Mouth/Throat:     Mouth: Mucous membranes are moist.     Pharynx: Oropharynx is clear.  Eyes:     Pupils: Pupils are equal, round, and reactive to light.  Cardiovascular:     Rate and Rhythm: Normal rate and regular rhythm.     Pulses: Normal pulses.     Heart sounds: No murmur heard. Pulmonary:     Effort: Pulmonary effort is normal. No respiratory distress.     Breath sounds: Normal breath sounds. No rales.  Abdominal:     General: Abdomen is flat. Bowel sounds are normal.     Palpations: Abdomen is soft.  Musculoskeletal:        General: No swelling.     Cervical back: Neck supple.  Skin:    General: Skin is warm.  Neurological:     General: No focal deficit present.     Mental Status: He is alert and oriented to person, place, and time.  Psychiatric:        Mood and Affect: Mood normal.        Thought Content: Thought content normal.       12/31/2023    4:29 PM 03/02/2021    3:16  PM 06/12/2018    7:36 AM  MMSE - Mini Mental State Exam  Orientation to time 5 4   Orientation to Place 5 5   Registration 3 3   Attention/ Calculation 2 4   Recall 1 1   Language- name 2 objects 2 2   Language- repeat 1 0   Language- follow 3 step command 3 3   Language- read & follow direction 1 1   Write a sentence 1 1   Copy design 0 1 1  Total score 24 25      Labs reviewed: Basic Metabolic Panel: Recent Labs    07/28/23 0831 12/18/23 1532  NA 137 135  K 4.3 4.2  CL 105 101  CO2 25 27  GLUCOSE 153* 149*  BUN 28* 30*  CREATININE 1.57* 1.53*  CALCIUM  8.6* 9.0   Liver Function Tests: Recent Labs    12/18/23 1532  AST 11*  ALT 7  ALKPHOS 46  BILITOT 0.4  PROT 5.9*  ALBUMIN 3.6   No results for input(s): "LIPASE", "AMYLASE" in the last 8760 hours. No results for input(s): "AMMONIA" in the last 8760 hours. CBC: Recent Labs    07/28/23 0831 12/18/23 1532  WBC 7.2 8.6  NEUTROABS 4.9 6.3  HGB 11.5* 11.3*  HCT 34.3* 34.3*  MCV 91.5 91.5  PLT 260 276   Lipid Panel: No results for input(s): "CHOL", "HDL", "LDLCALC", "TRIG", "CHOLHDL", "LDLDIRECT" in the last 8760 hours. Lab Results  Component Value Date   HGBA1C 8.0 (H) 04/20/2022    Procedures since last visit: CT Head Wo Contrast Result Date: 12/18/2023 CLINICAL DATA:  Head trauma, fall from standing, dizziness. EXAM: CT HEAD WITHOUT CONTRAST CT CERVICAL SPINE WITHOUT CONTRAST TECHNIQUE: Multidetector CT imaging of the head and cervical spine was performed following the standard protocol  without intravenous contrast. Multiplanar CT image reconstructions of the cervical spine were also generated. RADIATION DOSE REDUCTION: This exam was performed according to the departmental dose-optimization program which includes automated exposure control, adjustment of the mA and/or kV according to patient size and/or use of iterative reconstruction technique. COMPARISON:  None Available. FINDINGS: CT HEAD FINDINGS Brain:  No acute intracranial hemorrhage. No CT evidence of acute infarct. Nonspecific hypoattenuation in the periventricular and subcortical white matter favored to reflect chronic microvascular ischemic changes. Mild-to-moderate generalized parenchymal volume loss. No edema, mass effect, or midline shift. The basilar cisterns are patent. Ventricles: Prominence of the ventricles suggesting underlying parenchymal volume loss. Vascular: Atherosclerotic calcifications of the carotid siphons and intracranial vertebral arteries. No hyperdense vessel. Skull: No acute or aggressive finding. Orbits: Orbits are symmetric. Sinuses: The visualized paranasal sinuses are clear. Other: Mastoid air cells are clear. CT CERVICAL SPINE FINDINGS Alignment: Straightening and slight reversal of the normal cervical lordosis. Trace anterolisthesis of C7 on T1 likely related to facet degenerative changes. No facet subluxation or dislocation. Skull base and vertebrae: No compression fracture or displaced fracture in the cervical spine. No suspicious osseous lesions. Soft tissues and spinal canal: No prevertebral fluid or swelling. No visible canal hematoma. Disc levels: Intervertebral disc space narrowing at multiple levels. Disc osteophyte complexes throughout the cervical spine. No high-grade osseous spinal canal stenosis. Facet arthrosis and uncovertebral hypertrophy at multiple levels. Foraminal narrowing is most pronounced on the left at C3-4 through C5-6. Upper chest: Negative. Other: Partially visualized sequelae of median sternotomy. Partially visualized right shoulder arthroplasty hardware. IMPRESSION: No CT evidence of acute intracranial abnormality. No acute fracture or traumatic malalignment of the cervical spine. Chronic and degenerative changes as above. Electronically Signed   By: Denny Flack M.D.   On: 12/18/2023 17:42   CT Cervical Spine Wo Contrast Result Date: 12/18/2023 CLINICAL DATA:  Head trauma, fall from standing,  dizziness. EXAM: CT HEAD WITHOUT CONTRAST CT CERVICAL SPINE WITHOUT CONTRAST TECHNIQUE: Multidetector CT imaging of the head and cervical spine was performed following the standard protocol without intravenous contrast. Multiplanar CT image reconstructions of the cervical spine were also generated. RADIATION DOSE REDUCTION: This exam was performed according to the departmental dose-optimization program which includes automated exposure control, adjustment of the mA and/or kV according to patient size and/or use of iterative reconstruction technique. COMPARISON:  None Available. FINDINGS: CT HEAD FINDINGS Brain: No acute intracranial hemorrhage. No CT evidence of acute infarct. Nonspecific hypoattenuation in the periventricular and subcortical white matter favored to reflect chronic microvascular ischemic changes. Mild-to-moderate generalized parenchymal volume loss. No edema, mass effect, or midline shift. The basilar cisterns are patent. Ventricles: Prominence of the ventricles suggesting underlying parenchymal volume loss. Vascular: Atherosclerotic calcifications of the carotid siphons and intracranial vertebral arteries. No hyperdense vessel. Skull: No acute or aggressive finding. Orbits: Orbits are symmetric. Sinuses: The visualized paranasal sinuses are clear. Other: Mastoid air cells are clear. CT CERVICAL SPINE FINDINGS Alignment: Straightening and slight reversal of the normal cervical lordosis. Trace anterolisthesis of C7 on T1 likely related to facet degenerative changes. No facet subluxation or dislocation. Skull base and vertebrae: No compression fracture or displaced fracture in the cervical spine. No suspicious osseous lesions. Soft tissues and spinal canal: No prevertebral fluid or swelling. No visible canal hematoma. Disc levels: Intervertebral disc space narrowing at multiple levels. Disc osteophyte complexes throughout the cervical spine. No high-grade osseous spinal canal stenosis. Facet arthrosis  and uncovertebral hypertrophy at multiple levels. Foraminal narrowing is most pronounced on  the left at C3-4 through C5-6. Upper chest: Negative. Other: Partially visualized sequelae of median sternotomy. Partially visualized right shoulder arthroplasty hardware. IMPRESSION: No CT evidence of acute intracranial abnormality. No acute fracture or traumatic malalignment of the cervical spine. Chronic and degenerative changes as above. Electronically Signed   By: Denny Flack M.D.   On: 12/18/2023 17:42    Assessment/Plan 1. Fall, sequela (Primary) Doing well Little more Confused per family  CT did not show anything Acute D/W the Daughter it could be concussion Wait 2-3 more weeks before symptoms of Cloudiness get better Also has Zio patch for 2 weeks  2. MCI (mild cognitive impairment) MMSE close to his baseline  3. Depression, unspecified depression type I discussed with him and daughter on the phone that Patient seems depressed and this fall has made him more down as he is scared of going out and Falling again He also talks about dying a lot and asked me about [facility] and hospice I did agree with daughter that patient does not have any plan to kill himself. The daughter wants to talk to other siblings before deciding for antidepressant   4. Vertigo These Symptoms are not related to Vertigo Which Patient says have resolved   5. Stage 3b chronic kidney disease (HCC) Creat stable    Labs/tests ordered:  * No order type specified * Next appt:  03/03/2024

## 2024-01-15 DIAGNOSIS — R002 Palpitations: Secondary | ICD-10-CM | POA: Diagnosis not present

## 2024-01-30 ENCOUNTER — Inpatient Hospital Stay (HOSPITAL_COMMUNITY)
Admission: EM | Admit: 2024-01-30 | Discharge: 2024-02-01 | DRG: 392 | Disposition: A | Discharge status: Skilled Nursing Facility | Attending: Family Medicine | Admitting: Family Medicine

## 2024-01-30 ENCOUNTER — Encounter (HOSPITAL_COMMUNITY): Payer: Self-pay

## 2024-01-30 ENCOUNTER — Emergency Department (HOSPITAL_COMMUNITY)

## 2024-01-30 ENCOUNTER — Other Ambulatory Visit: Payer: Self-pay

## 2024-01-30 DIAGNOSIS — E860 Dehydration: Principal | ICD-10-CM | POA: Diagnosis present

## 2024-01-30 DIAGNOSIS — R531 Weakness: Secondary | ICD-10-CM | POA: Diagnosis not present

## 2024-01-30 DIAGNOSIS — R112 Nausea with vomiting, unspecified: Secondary | ICD-10-CM | POA: Diagnosis not present

## 2024-01-30 DIAGNOSIS — I700 Atherosclerosis of aorta: Secondary | ICD-10-CM | POA: Diagnosis not present

## 2024-01-30 DIAGNOSIS — F32A Depression, unspecified: Secondary | ICD-10-CM | POA: Diagnosis present

## 2024-01-30 DIAGNOSIS — Z87891 Personal history of nicotine dependence: Secondary | ICD-10-CM

## 2024-01-30 DIAGNOSIS — Z1152 Encounter for screening for COVID-19: Secondary | ICD-10-CM

## 2024-01-30 DIAGNOSIS — A419 Sepsis, unspecified organism: Secondary | ICD-10-CM

## 2024-01-30 DIAGNOSIS — Z7984 Long term (current) use of oral hypoglycemic drugs: Secondary | ICD-10-CM

## 2024-01-30 DIAGNOSIS — Z951 Presence of aortocoronary bypass graft: Secondary | ICD-10-CM

## 2024-01-30 DIAGNOSIS — Z66 Do not resuscitate: Secondary | ICD-10-CM | POA: Diagnosis present

## 2024-01-30 DIAGNOSIS — Z7982 Long term (current) use of aspirin: Secondary | ICD-10-CM

## 2024-01-30 DIAGNOSIS — Z79899 Other long term (current) drug therapy: Secondary | ICD-10-CM

## 2024-01-30 DIAGNOSIS — E1169 Type 2 diabetes mellitus with other specified complication: Secondary | ICD-10-CM | POA: Diagnosis present

## 2024-01-30 DIAGNOSIS — I2510 Atherosclerotic heart disease of native coronary artery without angina pectoris: Secondary | ICD-10-CM | POA: Diagnosis present

## 2024-01-30 DIAGNOSIS — Z961 Presence of intraocular lens: Secondary | ICD-10-CM | POA: Diagnosis present

## 2024-01-30 DIAGNOSIS — R000 Tachycardia, unspecified: Secondary | ICD-10-CM | POA: Diagnosis not present

## 2024-01-30 DIAGNOSIS — I129 Hypertensive chronic kidney disease with stage 1 through stage 4 chronic kidney disease, or unspecified chronic kidney disease: Secondary | ICD-10-CM | POA: Diagnosis present

## 2024-01-30 DIAGNOSIS — K529 Noninfective gastroenteritis and colitis, unspecified: Principal | ICD-10-CM | POA: Diagnosis present

## 2024-01-30 DIAGNOSIS — R4189 Other symptoms and signs involving cognitive functions and awareness: Secondary | ICD-10-CM | POA: Diagnosis present

## 2024-01-30 DIAGNOSIS — R0689 Other abnormalities of breathing: Secondary | ICD-10-CM | POA: Diagnosis not present

## 2024-01-30 DIAGNOSIS — Z9841 Cataract extraction status, right eye: Secondary | ICD-10-CM

## 2024-01-30 DIAGNOSIS — Z6824 Body mass index (BMI) 24.0-24.9, adult: Secondary | ICD-10-CM

## 2024-01-30 DIAGNOSIS — Z8249 Family history of ischemic heart disease and other diseases of the circulatory system: Secondary | ICD-10-CM

## 2024-01-30 DIAGNOSIS — R42 Dizziness and giddiness: Secondary | ICD-10-CM | POA: Diagnosis not present

## 2024-01-30 DIAGNOSIS — E785 Hyperlipidemia, unspecified: Secondary | ICD-10-CM | POA: Diagnosis present

## 2024-01-30 DIAGNOSIS — E118 Type 2 diabetes mellitus with unspecified complications: Secondary | ICD-10-CM | POA: Diagnosis present

## 2024-01-30 DIAGNOSIS — R509 Fever, unspecified: Secondary | ICD-10-CM | POA: Diagnosis not present

## 2024-01-30 DIAGNOSIS — E669 Obesity, unspecified: Secondary | ICD-10-CM | POA: Diagnosis present

## 2024-01-30 DIAGNOSIS — R231 Pallor: Secondary | ICD-10-CM | POA: Diagnosis not present

## 2024-01-30 DIAGNOSIS — N1832 Chronic kidney disease, stage 3b: Secondary | ICD-10-CM | POA: Diagnosis present

## 2024-01-30 DIAGNOSIS — E1122 Type 2 diabetes mellitus with diabetic chronic kidney disease: Secondary | ICD-10-CM | POA: Diagnosis present

## 2024-01-30 DIAGNOSIS — I252 Old myocardial infarction: Secondary | ICD-10-CM

## 2024-01-30 DIAGNOSIS — Z9079 Acquired absence of other genital organ(s): Secondary | ICD-10-CM

## 2024-01-30 DIAGNOSIS — Z9842 Cataract extraction status, left eye: Secondary | ICD-10-CM

## 2024-01-30 LAB — COMPREHENSIVE METABOLIC PANEL WITH GFR
ALT: 13 U/L (ref 0–44)
AST: 24 U/L (ref 15–41)
Albumin: 2.9 g/dL — ABNORMAL LOW (ref 3.5–5.0)
Alkaline Phosphatase: 37 U/L — ABNORMAL LOW (ref 38–126)
Anion gap: 9 (ref 5–15)
BUN: 30 mg/dL — ABNORMAL HIGH (ref 8–23)
CO2: 22 mmol/L (ref 22–32)
Calcium: 8.1 mg/dL — ABNORMAL LOW (ref 8.9–10.3)
Chloride: 105 mmol/L (ref 98–111)
Creatinine, Ser: 1.58 mg/dL — ABNORMAL HIGH (ref 0.61–1.24)
GFR, Estimated: 41 mL/min — ABNORMAL LOW (ref >60)
Glucose, Bld: 193 mg/dL — ABNORMAL HIGH (ref 70–99)
Potassium: 4 mmol/L (ref 3.5–5.1)
Sodium: 136 mmol/L (ref 135–145)
Total Bilirubin: 0.4 mg/dL (ref 0.0–1.2)
Total Protein: 5.7 g/dL — ABNORMAL LOW (ref 6.5–8.1)

## 2024-01-30 LAB — CBC WITH DIFFERENTIAL/PLATELET
Abs Immature Granulocytes: 0.04 10*3/uL (ref 0.00–0.07)
Basophils Absolute: 0 10*3/uL (ref 0.0–0.1)
Basophils Relative: 0 %
Eosinophils Absolute: 0 10*3/uL (ref 0.0–0.5)
Eosinophils Relative: 0 %
HCT: 37.3 % — ABNORMAL LOW (ref 39.0–52.0)
Hemoglobin: 12.1 g/dL — ABNORMAL LOW (ref 13.0–17.0)
Immature Granulocytes: 1 %
Lymphocytes Relative: 4 %
Lymphs Abs: 0.3 10*3/uL — ABNORMAL LOW (ref 0.7–4.0)
MCH: 30.5 pg (ref 26.0–34.0)
MCHC: 32.4 g/dL (ref 30.0–36.0)
MCV: 94 fL (ref 80.0–100.0)
Monocytes Absolute: 0.3 10*3/uL (ref 0.1–1.0)
Monocytes Relative: 3 %
Neutro Abs: 8 10*3/uL — ABNORMAL HIGH (ref 1.7–7.7)
Neutrophils Relative %: 92 %
Platelets: 230 10*3/uL (ref 150–400)
RBC: 3.97 MIL/uL — ABNORMAL LOW (ref 4.22–5.81)
RDW: 13.6 % (ref 11.5–15.5)
WBC: 8.6 10*3/uL (ref 4.0–10.5)
nRBC: 0 % (ref 0.0–0.2)

## 2024-01-30 LAB — URINALYSIS, ROUTINE W REFLEX MICROSCOPIC
Bacteria, UA: NONE SEEN
Bilirubin Urine: NEGATIVE
Glucose, UA: >=500 mg/dL — AB
Hgb urine dipstick: NEGATIVE
Ketones, ur: NEGATIVE mg/dL
Leukocytes,Ua: NEGATIVE
Nitrite: NEGATIVE
Protein, ur: NEGATIVE mg/dL
Specific Gravity, Urine: 1.018 (ref 1.005–1.030)
pH: 5 (ref 5.0–8.0)

## 2024-01-30 LAB — RESP PANEL BY RT-PCR (RSV, FLU A&B, COVID)  RVPGX2
Influenza A by PCR: NEGATIVE
Influenza B by PCR: NEGATIVE
Resp Syncytial Virus by PCR: NEGATIVE
SARS Coronavirus 2 by RT PCR: NEGATIVE

## 2024-01-30 LAB — LIPASE, BLOOD: Lipase: 30 U/L (ref 11–51)

## 2024-01-30 MED ORDER — SODIUM CHLORIDE 0.9 % IV BOLUS
1000.0000 mL | Freq: Once | INTRAVENOUS | Status: AC
  Administered 2024-01-30: 1000 mL via INTRAVENOUS

## 2024-01-30 MED ORDER — ONDANSETRON HCL 4 MG/2ML IJ SOLN: 4.0000 mg | Freq: Four times a day (QID) | INTRAMUSCULAR | Status: DC | PRN

## 2024-01-30 MED ORDER — MELATONIN 3 MG PO TABS: 6.0000 mg | ORAL_TABLET | Freq: Every evening | ORAL | Status: DC | PRN

## 2024-01-30 MED ORDER — ONDANSETRON HCL 4 MG/2ML IJ SOLN
4.0000 mg | Freq: Once | INTRAMUSCULAR | Status: AC
  Administered 2024-01-30: 4 mg via INTRAVENOUS
  Filled 2024-01-30: qty 2

## 2024-01-30 MED ORDER — ACETAMINOPHEN 500 MG PO TABS
1000.0000 mg | ORAL_TABLET | Freq: Four times a day (QID) | ORAL | Status: DC | PRN
  Administered 2024-01-31: 1000 mg via ORAL

## 2024-01-30 MED ORDER — POLYETHYLENE GLYCOL 3350 17 G PO PACK: 17.0000 g | PACK | Freq: Every day | ORAL | Status: DC | PRN

## 2024-01-30 MED ORDER — PROCHLORPERAZINE EDISYLATE 10 MG/2ML IJ SOLN
10.0000 mg | Freq: Four times a day (QID) | INTRAMUSCULAR | Status: DC | PRN
Start: 2024-01-30 — End: 2024-02-01

## 2024-01-30 NOTE — ED Triage Notes (Signed)
 PER EMS: pt is from [facility] with c/o generalized weakness, dizziness and urinary retention, but he did urinate about 100 cc upon arrival. Emesis x 4 last night. A&OX4.   BP- 118/62, HR-110, RR-25, CBG-204, 98% RA

## 2024-01-30 NOTE — ED Notes (Signed)
 6N charge RN called to be informed that pt is being taken upstais since care handoff has not been signed

## 2024-01-30 NOTE — ED Provider Notes (Signed)
 [place] EMERGENCY DEPARTMENT AT [facility] Provider Note   CSN: [id number] Arrival date & time: 01/30/24  1805     History  Chief Complaint  Patient presents with   Weakness    Tony Lee is a 89 y.o. male.  89 yo M with a chief complaints of fatigue.  He thinks that maybe he just overdid it today.  Had 4 doctors visits today.  Afterwards he just felt completely rundown.  Said he had an episode where he could not stand up to walk and he felt like he could not talk either.  This seems to have resolved and he feels better and would like to go home.  He does not think he was weak on one side or the other.  Denies any loss of consciousness.  Denies chest pain headache neck pain abdominal pain nausea vomiting or diarrhea.   Weakness      Home Medications Prior to Admission medications   Medication Sig Start Date End Date Taking? Authorizing Provider  amoxicillin (AMOXIL) 500 MG capsule Take 2,000 mg by mouth as needed. Prior to dental procedure. Patient not taking: Reported on 12/31/2023    [provider]  aspirin  EC 81 MG tablet Take 1 tablet (81 mg total) by mouth daily. Swallow whole. 04/24/22   Kaul, Aseem, MD  bismuth subsalicylate (PEPTO BISMOL) 262 MG chewable tablet Chew 524 mg by mouth as needed.    [provider]  Black Pepper-Turmeric 3-500 MG CAPS Take 1 capsule by mouth every Monday, Wednesday, and Friday.    [provider]  Cyanocobalamin (VITAMIN B-12 PO) Take 5,000 mcg by mouth at bedtime.    [provider]  diclofenac Sodium (VOLTAREN) 1 % GEL Apply 4 g topically See admin instructions. Apply to affected area of right wrist 4 times a day    [provider]  docusate sodium  (COLACE) 100 MG capsule Take 100 mg by mouth 2 (two) times daily.    [provider]  FARXIGA 5 MG TABS tablet Take 5 mg by mouth daily. 02/08/21   [provider]  fenofibrate  (TRICOR ) 48 MG tablet Take 1 tablet (48  mg total) by mouth daily. 01/07/23   [place], Peter M, MD  ipratropium (ATROVENT ) 0.03 % nasal spray Place 2 sprays into both nostrils 3 (three) times daily. 08/01/23   Wert, Christina, NP  loratadine (CLARITIN) 10 MG tablet Take 10 mg by mouth daily.    [provider]  losartan  (COZAAR ) 25 MG tablet Take 12.5 mg by mouth daily. Take 12.5mg  in the morning    [provider]  meclizine  (ANTIVERT ) 12.5 MG tablet Take 1 tablet (12.5 mg total) by mouth 3 (three) times daily as needed for dizziness. 07/31/23   Marguerite Shiley, MD  Multiple Vitamins-Minerals (PRESERVISION AREDS 2) CAPS Take 1 capsule by mouth. 1 capsule, oral, Twice a day    [provider]  nitroGLYCERIN  (NITROSTAT ) 0.4 MG SL tablet PLACE 1 TABLET (0.4 MG TOTAL) UNDER THE TONGUE EVERY 5 (FIVE) MINUTES AS NEEDED. 06/27/22   [place], Peter M, MD  omeprazole (PRILOSEC) 40 MG capsule Take 40 mg by mouth 2 (two) times daily. 01/08/13   [provider]  polyethylene glycol (MIRALAX / GLYCOLAX) 17 g packet Take 17 g by mouth daily as needed.    [provider]  simvastatin  (ZOCOR ) 20 MG tablet Take 20 mg by mouth at bedtime.      [provider]  TRADJENTA  5 MG  TABS tablet Take 5 mg by mouth daily. 09/17/15   [provider]      Allergies    Other    Review of Systems   Review of Systems  Neurological:  Positive for weakness.    Physical Exam Updated Vital Signs BP (!) 103/53   Pulse 97   Temp 98.6 F (37 C) (Oral)   Resp (!) 28   Ht 5\' 7"  (1.702 m)   Wt 71.2 kg   SpO2 95%   BMI 24.59 kg/m  Physical Exam Vitals and nursing note reviewed.  Constitutional:      Appearance: He is well-developed.  HENT:     Head: Normocephalic and atraumatic.  Eyes:     Pupils: Pupils are equal, round, and reactive to light.  Neck:     Vascular: No JVD.  Cardiovascular:     Rate and Rhythm: Normal rate and regular rhythm.     Heart sounds: No murmur heard.    No friction rub. No  gallop.  Pulmonary:     Effort: No respiratory distress.     Breath sounds: No wheezing.  Abdominal:     General: There is no distension.     Tenderness: There is no abdominal tenderness. There is no guarding or rebound.  Musculoskeletal:        General: Normal range of motion.     Cervical back: Normal range of motion and neck supple.  Skin:    Coloration: Skin is not pale.     Findings: No rash.  Neurological:     Mental Status: He is alert and oriented to person, place, and time.  Psychiatric:        Behavior: Behavior normal.     ED Results / Procedures / Treatments   Labs (all labs ordered are listed, but only abnormal results are displayed) Labs Reviewed  CBC WITH DIFFERENTIAL/PLATELET - Abnormal; Notable for the following components:      Result Value   RBC 3.97 (*)    Hemoglobin 12.1 (*)    HCT 37.3 (*)    Neutro Abs 8.0 (*)    Lymphs Abs 0.3 (*)    All other components within normal limits  COMPREHENSIVE METABOLIC PANEL WITH GFR - Abnormal; Notable for the following components:   Glucose, Bld 193 (*)    BUN 30 (*)    Creatinine, Ser 1.58 (*)    Calcium  8.1 (*)    Total Protein 5.7 (*)    Albumin 2.9 (*)    Alkaline Phosphatase 37 (*)    GFR, Estimated 41 (*)    All other components within normal limits  URINALYSIS, ROUTINE W REFLEX MICROSCOPIC - Abnormal; Notable for the following components:   Glucose, UA >=500 (*)    All other components within normal limits  RESP PANEL BY RT-PCR (RSV, FLU A&B, COVID)  [id number]  LIPASE, BLOOD    EKG EKG Interpretation Date/Time:  Thursday Jan 30 2024 19:25:57 EDT Ventricular Rate:  91 PR Interval:  269 QRS Duration:  129 QT Interval:  385 QTC Calculation: 474 R Axis:   44  Text Interpretation: Sinus or ectopic atrial rhythm Prolonged PR interval Right bundle branch block No significant change since last tracing Confirmed by Albertus Hughs [phone]) on 01/30/2024 8:17:33 PM  Radiology DG Chest Port 1 View Result Date:  01/30/2024 CLINICAL DATA:  Fevers EXAM: PORTABLE CHEST 1 VIEW COMPARISON:  04/20/2022 FINDINGS: Check shadow is stable. Postsurgical changes are noted. Aortic calcifications are seen. Lungs are  well aerated bilaterally. No focal infiltrate or effusion is seen. No bony abnormality is noted. IMPRESSION: No active disease. Electronically Signed   By: Violeta Grey M.D.   On: 01/30/2024 21:46    Procedures Procedures    Medications Ordered in ED Medications  sodium chloride  0.9 % bolus 1,000 mL (1,000 mLs Intravenous New Bag/Given 01/30/24 1916)  sodium chloride  0.9 % bolus 1,000 mL (1,000 mLs Intravenous New Bag/Given 01/30/24 2059)  ondansetron  (ZOFRAN ) injection 4 mg (4 mg Intravenous Given 01/30/24 2059)    ED Course/ Medical Decision Making/ A&P                                 Medical Decision Making Amount and/or Complexity of Data Reviewed Labs: ordered. Radiology: ordered. ECG/medicine tests: ordered.  Risk Prescription drug management. Decision regarding hospitalization.   89 yo M with a chief complaints of fatigue.  This happened today.  He said he had been doing reasonably well and then felt like he was too weak to walk and was unable to speak for a period of time.  He feels better now and thinks maybe he could walk and would like to go home.  Will obtain a laboratory evaluation bolus of IV fluids.  Reassess.  No acute anemia, no significant electrolyte abnormalities.  Patient unable to sit up on his own.  Family is now here and provides further history.  Tells me he had nausea and vomiting for the past 24 to 48 hours.  Reportedly had a fever at his facility earlier today as well.  No abdominal pain on repeat exam.  Will add a lipase.  Chest x-ray.  Patient is normally ambulatory with a walker.  This patient is now not able to sit up but suspect he is significantly dehydrated.  Will discuss with medicine.  The patients results and plan were reviewed and discussed.   Any  x-rays performed were independently reviewed by myself.   Differential diagnosis were considered with the presenting HPI.  Medications  sodium chloride  0.9 % bolus 1,000 mL (1,000 mLs Intravenous New Bag/Given 01/30/24 1916)  sodium chloride  0.9 % bolus 1,000 mL (1,000 mLs Intravenous New Bag/Given 01/30/24 2059)  ondansetron  (ZOFRAN ) injection 4 mg (4 mg Intravenous Given 01/30/24 2059)    Vitals:   01/30/24 2130 01/30/24 2145 01/30/24 2200 01/30/24 2215  BP:   (!) 108/55 (!) 103/53  Pulse:      Resp: (!) 23 (!) 23 (!) 25 (!) 28  Temp:      TempSrc:      SpO2:      Weight:      Height:        Final diagnoses:  Dehydration    Admission/ observation were discussed with the admitting physician, patient and/or family and they are comfortable with the plan.          Final Clinical Impression(s) / ED Diagnoses Final diagnoses:  Dehydration    Rx / DC Orders ED Discharge Orders     None         Albertus Hughs, DO 01/30/24 2245

## 2024-01-30 NOTE — ED Notes (Addendum)
 This RN attempted to ambulate pt per Dr.Floyd order. Pt could not sit on the edge of the bed on his own without trying to fall back on his back, c/o feeling tired and weak. This RN did not attempt to ambulate pt d/t fall risk. Dr.Floyd made aware.

## 2024-01-30 NOTE — ED Notes (Signed)
 Pt will not leave his pulse ox on

## 2024-01-31 ENCOUNTER — Observation Stay (HOSPITAL_COMMUNITY)

## 2024-01-31 DIAGNOSIS — E1122 Type 2 diabetes mellitus with diabetic chronic kidney disease: Secondary | ICD-10-CM | POA: Diagnosis not present

## 2024-01-31 DIAGNOSIS — A419 Sepsis, unspecified organism: Secondary | ICD-10-CM | POA: Diagnosis not present

## 2024-01-31 DIAGNOSIS — I252 Old myocardial infarction: Secondary | ICD-10-CM | POA: Diagnosis not present

## 2024-01-31 DIAGNOSIS — K529 Noninfective gastroenteritis and colitis, unspecified: Secondary | ICD-10-CM | POA: Diagnosis present

## 2024-01-31 DIAGNOSIS — Z79899 Other long term (current) drug therapy: Secondary | ICD-10-CM | POA: Diagnosis not present

## 2024-01-31 DIAGNOSIS — Z8249 Family history of ischemic heart disease and other diseases of the circulatory system: Secondary | ICD-10-CM | POA: Diagnosis not present

## 2024-01-31 DIAGNOSIS — K8689 Other specified diseases of pancreas: Secondary | ICD-10-CM | POA: Diagnosis not present

## 2024-01-31 DIAGNOSIS — R112 Nausea with vomiting, unspecified: Secondary | ICD-10-CM | POA: Diagnosis not present

## 2024-01-31 DIAGNOSIS — Z6824 Body mass index (BMI) 24.0-24.9, adult: Secondary | ICD-10-CM | POA: Diagnosis not present

## 2024-01-31 DIAGNOSIS — R404 Transient alteration of awareness: Secondary | ICD-10-CM | POA: Diagnosis not present

## 2024-01-31 DIAGNOSIS — E860 Dehydration: Secondary | ICD-10-CM | POA: Diagnosis not present

## 2024-01-31 DIAGNOSIS — N1832 Chronic kidney disease, stage 3b: Secondary | ICD-10-CM | POA: Diagnosis not present

## 2024-01-31 DIAGNOSIS — E785 Hyperlipidemia, unspecified: Secondary | ICD-10-CM | POA: Diagnosis not present

## 2024-01-31 DIAGNOSIS — Z1152 Encounter for screening for COVID-19: Secondary | ICD-10-CM | POA: Diagnosis not present

## 2024-01-31 DIAGNOSIS — Z951 Presence of aortocoronary bypass graft: Secondary | ICD-10-CM | POA: Diagnosis not present

## 2024-01-31 DIAGNOSIS — I2510 Atherosclerotic heart disease of native coronary artery without angina pectoris: Secondary | ICD-10-CM | POA: Diagnosis not present

## 2024-01-31 DIAGNOSIS — Z66 Do not resuscitate: Secondary | ICD-10-CM | POA: Diagnosis not present

## 2024-01-31 DIAGNOSIS — Z7984 Long term (current) use of oral hypoglycemic drugs: Secondary | ICD-10-CM | POA: Diagnosis not present

## 2024-01-31 DIAGNOSIS — I129 Hypertensive chronic kidney disease with stage 1 through stage 4 chronic kidney disease, or unspecified chronic kidney disease: Secondary | ICD-10-CM | POA: Diagnosis not present

## 2024-01-31 DIAGNOSIS — Z9841 Cataract extraction status, right eye: Secondary | ICD-10-CM | POA: Diagnosis not present

## 2024-01-31 DIAGNOSIS — Z9079 Acquired absence of other genital organ(s): Secondary | ICD-10-CM | POA: Diagnosis not present

## 2024-01-31 DIAGNOSIS — Z961 Presence of intraocular lens: Secondary | ICD-10-CM | POA: Diagnosis not present

## 2024-01-31 DIAGNOSIS — Z7401 Bed confinement status: Secondary | ICD-10-CM | POA: Diagnosis not present

## 2024-01-31 DIAGNOSIS — R4189 Other symptoms and signs involving cognitive functions and awareness: Secondary | ICD-10-CM | POA: Diagnosis not present

## 2024-01-31 DIAGNOSIS — E669 Obesity, unspecified: Secondary | ICD-10-CM | POA: Diagnosis not present

## 2024-01-31 DIAGNOSIS — F32A Depression, unspecified: Secondary | ICD-10-CM | POA: Diagnosis not present

## 2024-01-31 DIAGNOSIS — Z9842 Cataract extraction status, left eye: Secondary | ICD-10-CM | POA: Diagnosis not present

## 2024-01-31 DIAGNOSIS — Z87891 Personal history of nicotine dependence: Secondary | ICD-10-CM | POA: Diagnosis not present

## 2024-01-31 DIAGNOSIS — Z7982 Long term (current) use of aspirin: Secondary | ICD-10-CM | POA: Diagnosis not present

## 2024-01-31 LAB — GLUCOSE, CAPILLARY
Glucose-Capillary: 126 mg/dL — ABNORMAL HIGH (ref 70–99)
Glucose-Capillary: 118 mg/dL — ABNORMAL HIGH (ref 70–99)
Glucose-Capillary: 154 mg/dL — ABNORMAL HIGH (ref 70–99)

## 2024-01-31 LAB — LACTIC ACID, PLASMA: Lactic Acid, Venous: 1.1 mmol/L (ref 0.5–1.9)

## 2024-01-31 MED ORDER — ASPIRIN 81 MG PO TBEC
81.0000 mg | DELAYED_RELEASE_TABLET | Freq: Every day | ORAL | Status: DC
  Administered 2024-01-31 – 2024-02-01 (×2): 81 mg via ORAL
  Filled 2024-01-31 (×2): qty 1

## 2024-01-31 MED ORDER — SODIUM CHLORIDE 0.9 % IV SOLN
2.0000 g | INTRAVENOUS | Status: DC
  Administered 2024-01-31: 2 g via INTRAVENOUS
  Filled 2024-01-31 (×3): qty 20

## 2024-01-31 MED ORDER — MIDODRINE HCL 5 MG PO TABS: 5.0000 mg | ORAL_TABLET | Freq: Three times a day (TID) | ORAL | Status: DC

## 2024-01-31 MED ORDER — MIDODRINE HCL 5 MG PO TABS
5.0000 mg | ORAL_TABLET | Freq: Three times a day (TID) | ORAL | Status: DC
  Administered 2024-01-31 – 2024-02-01 (×4): 5 mg via ORAL
  Filled 2024-01-31 (×5): qty 1

## 2024-01-31 MED ORDER — IOHEXOL 350 MG/ML SOLN
75.0000 mL | Freq: Once | INTRAVENOUS | Status: AC | PRN
  Administered 2024-01-31: 75 mL via INTRAVENOUS

## 2024-01-31 MED ORDER — SIMVASTATIN 20 MG PO TABS
20.0000 mg | ORAL_TABLET | Freq: Every day | ORAL | Status: DC
  Administered 2024-01-31: 20 mg via ORAL
  Filled 2024-01-31: qty 1

## 2024-01-31 MED ORDER — METRONIDAZOLE 500 MG/100ML IV SOLN
500.0000 mg | Freq: Two times a day (BID) | INTRAVENOUS | Status: DC
  Administered 2024-01-31 – 2024-02-01 (×3): 500 mg via INTRAVENOUS
  Filled 2024-01-31 (×3): qty 100

## 2024-01-31 MED ORDER — SODIUM CHLORIDE 0.9 % IV BOLUS
500.0000 mL | Freq: Once | INTRAVENOUS | Status: AC
  Administered 2024-01-31: 500 mL via INTRAVENOUS

## 2024-01-31 MED ORDER — FENOFIBRATE 54 MG PO TABS
54.0000 mg | ORAL_TABLET | Freq: Every day | ORAL | Status: DC
  Administered 2024-01-31 – 2024-02-01 (×2): 54 mg via ORAL
  Filled 2024-01-31 (×2): qty 1

## 2024-01-31 MED ORDER — INSULIN ASPART 100 UNIT/ML IJ SOLN: 0.0000 [IU] | Freq: Three times a day (TID) | INTRAMUSCULAR | Status: DC

## 2024-01-31 NOTE — Progress Notes (Signed)
 Pt returned from CT

## 2024-01-31 NOTE — NC FL2 (Signed)
 [place]  MEDICAID FL2 LEVEL OF CARE FORM     IDENTIFICATION  Patient Name: Tony Lee Birthdate: 04-23-1931 Sex: male Admission Date (Current Location): 01/30/2024  [facility] and [place] Number:  Producer, television/film/video and Address:  [facility] [place], [address] [place], [place] [address]      Provider Number: [id number]  Attending Physician Name and Address:  Modena Andes, MD  Relative Name and Phone Number:       Current Level of Care: Hospital Recommended Level of Care: Skilled Nursing Facility Prior Approval Number:    Date Approved/Denied:   PASRR Number:    Discharge Plan: SNF    Current Diagnoses: Patient Active Problem List   Diagnosis Date Noted   Gastroenteritis 01/31/2024   Sepsis (HCC) 01/31/2024   Intractable nausea and vomiting 01/30/2024   Non-ST elevation (NSTEMI) myocardial infarction [facility])    Unstable angina (HCC) 04/20/2022   Chest pain 04/20/2022   Type 2 diabetes mellitus with obesity (HCC) 08/27/2014   Monoclonal paraproteinemia 03/03/2013   Hereditary and idiopathic peripheral neuropathy 01/29/2013   Ataxia 01/29/2013   Coronary artery disease    Cancer (HCC)    RBBB (right bundle branch block)    Palpitations    Dyslipidemia     Orientation RESPIRATION BLADDER Height & Weight     Self, Time, Situation, Place (fluctuating orientation)  Normal Continent Weight: 157 lb (71.2 kg) Height:  5\' 7"  (170.2 cm)  BEHAVIORAL SYMPTOMS/MOOD NEUROLOGICAL BOWEL NUTRITION STATUS      Continent    AMBULATORY STATUS COMMUNICATION OF NEEDS Skin   Extensive Assist Verbally Normal                       Personal Care Assistance Level of Assistance  Bathing, Feeding, Dressing Bathing Assistance: Maximum assistance Feeding assistance: Limited assistance Dressing Assistance: Limited assistance     Functional Limitations Info  Sight, Hearing, Speech Sight Info: Impaired Hearing Info: Adequate Speech Info: Adequate     SPECIAL CARE FACTORS FREQUENCY  PT (By licensed PT), OT (By licensed OT)                    Contractures Contractures Info: Not present    Additional Factors Info  Code Status Code Status Info: DNR             Current Medications (01/31/2024):  This is the current hospital active medication list Current Facility-Administered Medications  Medication Dose Route Frequency Provider Last Rate Last Admin   acetaminophen  (TYLENOL ) tablet 1,000 mg  1,000 mg Oral Q6H PRN Arnulfo Larch, MD   1,000 mg at 01/31/24 0109   aspirin  EC tablet 81 mg  81 mg Oral Daily Segars, Arlyce Lambert, MD   81 mg at 01/31/24 1042   cefTRIAXone  (ROCEPHIN ) 2 g in sodium chloride  0.9 % 100 mL IVPB  2 g Intravenous Q24H Arnulfo Larch, MD 200 mL/hr at 01/31/24 0839 2 g at 01/31/24 2952   fenofibrate  tablet 54 mg  54 mg Oral Daily Segars, Arlyce Lambert, MD   54 mg at 01/31/24 1042   insulin  aspart (novoLOG ) injection 0-6 Units  0-6 Units Subcutaneous TID WC Segars, Arlyce Lambert, MD       melatonin tablet 6 mg  6 mg Oral QHS PRN Segars, Jonathan, MD       metroNIDAZOLE (FLAGYL) IVPB 500 mg  500 mg Intravenous Q12H Segars, Jonathan, MD 100 mL/hr at 01/31/24 0650 500 mg at 01/31/24 0650   midodrine (  PROAMATINE) tablet 5 mg  5 mg Oral TID WC Segars, Arlyce Lambert, MD   5 mg at 01/31/24 3244   ondansetron  (ZOFRAN ) injection 4 mg  4 mg Intravenous Q6H PRN Segars, Arlyce Lambert, MD       polyethylene glycol (MIRALAX / GLYCOLAX) packet 17 g  17 g Oral Daily PRN Segars, Arlyce Lambert, MD       prochlorperazine (COMPAZINE) injection 10 mg  10 mg Intravenous Q6H PRN Segars, Arlyce Lambert, MD       simvastatin  (ZOCOR ) tablet 20 mg  20 mg Oral QHS Segars, Jonathan, MD         Discharge Medications: Please see discharge summary for a list of discharge medications.  Relevant Imaging Results:  Relevant Lab Results:   Additional Information SS# [id number] [place], [place]

## 2024-01-31 NOTE — Evaluation (Signed)
 Physical Therapy Evaluation Patient Details Name: Tony Lee MRN: [id number] DOB: Jun 20, 1931 Today's Date: 01/31/2024  History of Present Illness  Pt is a 89 y.o. male presenting to [facility] ED on 01/30/24 due to progressive weakness, nausea, vomiting, and diarrhea. PMH is significant for CAD, non-STEMI, CABG x 5 (LIMA-LAD, SVG-OM1-3, SVG RCA), hypertension, diabetes type 2, CKD 3B, hyperlipidemia, cognitive impairment, and depression.  Clinical Impression  Pt presents to evaluation with decreased range of motion, decreased strength, decreased activity tolerance, impaired balance, and impaired cognition, all limiting patient's ability to safely mobilize near baseline. Pt reports at baseline he utilizes rollator for mobilization, and he is the primary caregiver for his spouse. Pt performed stand-step transfer to bedside chair w/ AD and minimal physical assistance. Pt requires frequent verbal cueing for body orientation and reorientation to task. Discussed possibility of going to short term rehab for further therapies to aid in return to prior level of function, being able to mobilize mod I and be primary caregiver for wife, but patient is adamant on going home following discharge. Pt would benefit from further transfer and gait training, and strengthening exercises.  PT will continue to treat patient while he is admitted. Patient will benefit from continued inpatient follow up therapy, <3 hours/day.  Social worker later informed therapist there is a rehab section to [facility]; this may be where miscommunication between therapist and patient lay.          If plan is discharge home, recommend the following: A little help with walking and/or transfers;A little help with bathing/dressing/bathroom;Assist for transportation;Help with stairs or ramp for entrance;Supervision due to cognitive status   Can travel by private vehicle   No    Equipment Recommendations Rolling walker (2 wheels);BSC/3in1   Recommendations for Other Services       Functional Status Assessment Patient has had a recent decline in their functional status and demonstrates the ability to make significant improvements in function in a reasonable and predictable amount of time.     Precautions / Restrictions Precautions Precautions: Fall Recall of Precautions/Restrictions: Intact Restrictions Weight Bearing Restrictions Per Provider Order: No      Mobility  Bed Mobility Overal bed mobility: Needs Assistance Bed Mobility: Supine to Sit     Supine to sit: Used rails, HOB elevated, Min assist     General bed mobility comments: Pt completed sup > sit w/ min A for occasional LE management, using bed rail and HOB elevated; increased time to complete.    Transfers Overall transfer level: Needs assistance Equipment used: Rolling walker (2 wheels) Transfers: Sit to/from Stand, Bed to chair/wheelchair/BSC Sit to Stand: Mod assist   Step pivot transfers: Min assist       General transfer comment: Pt completed STS at EOB w/ RW and mod A for obtaining upright. Pt demonstrates increased hip, knee and trunk flexion; VC given for increasing extension respectively. Pt then completed STS from EOB w/ RW and min A requiring VC for increased extension through hips, knees and trunk. Increased time to complete. VC given for sequencing; increased time to complete. Stand step transfer from EOB to bedside chair w/ RW and min A. Pt requires heavy VC for sequencing.    Ambulation/Gait                  Stairs            Wheelchair Mobility     Tilt Bed    Modified Rankin (Stroke Patients Only)  Balance Overall balance assessment: Needs assistance Sitting-balance support: Bilateral upper extremity supported, Feet supported Sitting balance-Leahy Scale: Poor     Standing balance support: Bilateral upper extremity supported, During functional activity, Reliant on assistive device for  balance Standing balance-Leahy Scale: Poor Standing balance comment: Pt stood EOB for ~3 mins and then ~2 mins for peri care w/ RW and min A.                             Pertinent Vitals/Pain Pain Assessment Pain Assessment: No/denies pain    Home Living Family/patient expects to be discharged to:: Assisted living Living Arrangements: Spouse/significant other               Home Equipment: Rollator (4 wheels);Wheelchair - power      Prior Function Prior Level of Function : Independent/Modified Independent;History of Falls (last six months)             Mobility Comments: Pt reports mod I with mobility, utilizing rollator for house and community level distances. For longer community distances, will transfer to powerchair. ADLs Comments: Pt reports independent with all ADLs. Pt reports he is primary caregiver for his wife.     Extremity/Trunk Assessment   Upper Extremity Assessment Upper Extremity Assessment: Generalized weakness    Lower Extremity Assessment Lower Extremity Assessment: Generalized weakness    Cervical / Trunk Assessment Cervical / Trunk Assessment: Kyphotic  Communication   Communication Communication: Impaired Factors Affecting Communication: Hearing impaired    Cognition Arousal: Alert Behavior During Therapy: Restless   PT - Cognitive impairments: History of cognitive impairments, Sequencing, Safety/Judgement, Memory, Attention                         Following commands: Intact       Cueing Cueing Techniques: Verbal cues, Tactile cues, Visual cues     General Comments General comments (skin integrity, edema, etc.): Orthostatics: supine BP 99/62, seated BP 115/66, end of session in chair BP 110/60. Pt reports mild dizziness upon sitting EOB but subsides with increased time.    Exercises     Assessment/Plan    PT Assessment Patient needs continued PT services  PT Problem List Decreased strength;Decreased  range of motion;Decreased activity tolerance;Decreased balance;Decreased mobility;Decreased coordination;Decreased cognition;Decreased knowledge of use of DME;Decreased safety awareness       PT Treatment Interventions DME instruction;Gait training;Functional mobility training;Therapeutic activities;Therapeutic exercise;Balance training;Cognitive remediation;Patient/family education;Wheelchair mobility training    PT Goals (Current goals can be found in the Care Plan section)  Acute Rehab PT Goals Patient Stated Goal: to return home so he can help his wife PT Goal Formulation: With patient Time For Goal Achievement: 02/14/24 Potential to Achieve Goals: Good    Frequency Min 3X/week     Co-evaluation               AM-PAC PT "6 Clicks" Mobility  Outcome Measure Help needed turning from your back to your side while in a flat bed without using bedrails?: A Little Help needed moving from lying on your back to sitting on the side of a flat bed without using bedrails?: A Little Help needed moving to and from a bed to a chair (including a wheelchair)?: A Little Help needed standing up from a chair using your arms (e.g., wheelchair or bedside chair)?: A Little Help needed to walk in hospital room?: A Little Help needed climbing 3-5 steps with a railing? :  A Lot 6 Click Score: 17    End of Session Equipment Utilized During Treatment: Gait belt Activity Tolerance: Patient tolerated treatment well Patient left: in chair;with call bell/phone within reach;with chair alarm set Nurse Communication: Mobility status;Need for lift equipment PT Visit Diagnosis: Unsteadiness on feet (R26.81);Muscle weakness (generalized) (M62.81);History of falling (Z91.81)    Time: 4098-1191 PT Time Calculation (min) (ACUTE ONLY): 57 min   Charges:   PT Evaluation $PT Eval Low Complexity: 1 Low PT Treatments $Therapeutic Activity: 8-22 mins PT General Charges $$ ACUTE PT VISIT: 1 Visit          Lonell Rives, SPT Acute Rehab [phone]   Lonell Rives 01/31/2024, 12:28 PM

## 2024-01-31 NOTE — H&P (Signed)
 History and Physical    Tony Lee UEA:[id number] DOB: 1931/04/01 DOA: 01/30/2024  PCP: Tony Shiley, MD   Patient coming from: Home   Chief Complaint:  Chief Complaint  Patient presents with   Weakness    HPI:  Tony Lee is a 89 y.o. male with hx of CAD, hx non-STEMI, CABG x 5 (LIMA-LAD, SVG-OM1-3, SVG RCA), hypertension, diabetes type 2, CKD 3B, hyperlipidemia, cognitive impairment, depression, who was brought in due to progressive weakness.  History mainly provided by patient's daughter at bedside.  Reports that he has had about 4 days of progressive weakness.  Typically he is ambulatory with the use of a walker but now unable to sit up in bed or stand.  He has had 2 days of nausea and vomiting and 3 episodes of diarrhea last night.  Decreased p.o. intake.  Otherwise denies any recent illness   Review of Systems:  ROS complete and negative except as marked above   Allergies  Allergen Reactions   Other Itching     Mepergan Fortis, (meprozine)  = itching (patient disputes this in 2023)    Prior to Admission medications   Medication Sig Start Date End Date Taking? Authorizing Provider  amoxicillin (AMOXIL) 500 MG capsule Take 2,000 mg by mouth as needed. Prior to dental procedure. Patient not taking: Reported on 12/31/2023    [provider]  aspirin  EC 81 MG tablet Take 1 tablet (81 mg total) by mouth daily. Swallow whole. 04/24/22   Kaul, Aseem, MD  bismuth subsalicylate (PEPTO BISMOL) 262 MG chewable tablet Chew 524 mg by mouth as needed.    [provider]  Black Pepper-Turmeric 3-500 MG CAPS Take 1 capsule by mouth every Monday, Wednesday, and Friday.    [provider]  Cyanocobalamin (VITAMIN B-12 PO) Take 5,000 mcg by mouth at bedtime.    [provider]  diclofenac Sodium (VOLTAREN) 1 % GEL Apply 4 g topically See admin instructions. Apply to affected area of right wrist 4 times a day    [provider]  docusate  sodium (COLACE) 100 MG capsule Take 100 mg by mouth 2 (two) times daily.    [provider]  FARXIGA 5 MG TABS tablet Take 5 mg by mouth daily. 02/08/21   [provider]  fenofibrate  (TRICOR ) 48 MG tablet Take 1 tablet (48 mg total) by mouth daily. 01/07/23   [place], Peter M, MD  ipratropium (ATROVENT ) 0.03 % nasal spray Place 2 sprays into both nostrils 3 (three) times daily. 08/01/23   Wert, Christina, NP  loratadine (CLARITIN) 10 MG tablet Take 10 mg by mouth daily.    [provider]  losartan  (COZAAR ) 25 MG tablet Take 12.5 mg by mouth daily. Take 12.5mg  in the morning    [provider]  meclizine  (ANTIVERT ) 12.5 MG tablet Take 1 tablet (12.5 mg total) by mouth 3 (three) times daily as needed for dizziness. 07/31/23   Tony Shiley, MD  Multiple Vitamins-Minerals (PRESERVISION AREDS 2) CAPS Take 1 capsule by mouth. 1 capsule, oral, Twice a day    [provider]  nitroGLYCERIN  (NITROSTAT ) 0.4 MG SL tablet PLACE 1 TABLET (0.4 MG TOTAL) UNDER THE TONGUE EVERY 5 (FIVE) MINUTES AS NEEDED. 06/27/22   [place], Peter M, MD  omeprazole (PRILOSEC) 40 MG capsule Take 40 mg by mouth 2 (two) times daily. 01/08/13   [provider]  polyethylene glycol (MIRALAX / GLYCOLAX) 17 g packet Take 17 g by mouth daily  as needed.    [provider]  simvastatin  (ZOCOR ) 20 MG tablet Take 20 mg by mouth at bedtime.      [provider]  TRADJENTA  5 MG TABS tablet Take 5 mg by mouth daily. 09/17/15   [provider]    Past Medical History:  Diagnosis Date   Cancer (HCC) 09/10/2002   prostate   Chronic kidney disease, stage 3b (HCC)    Coronary artery disease    Depression    Diabetes mellitus type 2 in obese    Dyslipidemia    Esophageal stricture    Monoclonal paraproteinemia 03/03/2013   Palpitations    RBBB (right bundle branch block)     Past Surgical History:  Procedure Laterality Date   ANKLE SURGERY Left    APPENDECTOMY      CARDIAC CATHETERIZATION     Dr. Swaziland   CATARACT EXTRACTION W/ INTRAOCULAR LENS  IMPLANT, BILATERAL     CORONARY ARTERY BYPASS GRAFT  Oct. 2008   x 5,Dr.Gerhardt   CORONARY/GRAFT ACUTE MI REVASCULARIZATION N/A 04/22/2022   Procedure: Coronary/Graft Acute MI Revascularization;  Surgeon: [place]il[place], Peter M, MD;  Location: [facility] INVASIVE CV LAB;  Service: Cardiovascular;  Laterality: N/A;   HERNIA REPAIR Bilateral    LEFT HEART CATH AND CORONARY ANGIOGRAPHY N/A 04/22/2022   Procedure: LEFT HEART CATH AND CORONARY ANGIOGRAPHY;  Surgeon: [place]il[place], Peter M, MD;  Location: [facility] INVASIVE CV LAB;  Service: Cardiovascular;  Laterality: N/A;   PROSTATECTOMY  2004   radical   right shoulder      replacement   vein strippping       reports that he quit smoking about 56 years ago. His smoking use included cigarettes. He started smoking about 76 years ago. He has a 10 pack-year smoking history. He has never used smokeless tobacco. He reports current alcohol  use. He reports that he does not use drugs.  Family History  Problem Relation Age of Onset   Heart failure Father        age 20   Heart attack Brother        age 25 post third open heart surgery   Heart attack Brother      Physical Exam: Vitals:   01/30/24 2254 01/30/24 2300 01/30/24 2315 01/30/24 2359  BP:  (!) 100/56  (!) 93/49  Pulse:    78  Resp:  (!) 26 (!) 23 19  Temp: 99 F (37.2 C)   (!) 100.4 F (38 C)  TempSrc: Oral   Oral  SpO2:    96%  Weight:      Height:        Gen: Awake, alert, elderly, NAD CV: Regular, normal S1, S2, no murmurs  Resp: Normal WOB, coarse breath sounds Abd: Flat, normoactive, nontender throughout MSK: Symmetric, no edema  Skin: No rashes or lesions to exposed skin  Neuro: Alert and interactive  Psych: euthymic, appropriate    Data review:   Labs reviewed, notable for:   Chemistries and blood counts unremarkable UA not consistent with infection   Micro:  Results for orders placed or performed  during the hospital encounter of 01/30/24  Resp panel by RT-PCR (RSV, Flu A&B, Covid) Anterior Nasal Swab     Status: None   Collection Time: 01/30/24  9:02 PM   Specimen: Anterior Nasal Swab  Result Value Ref Range Status   SARS Coronavirus 2 by RT PCR NEGATIVE NEGATIVE Final   Influenza A by PCR NEGATIVE NEGATIVE Final   Influenza  B by PCR NEGATIVE NEGATIVE Final    Comment: (NOTE) The Xpert Xpress SARS-CoV-2/FLU/RSV plus assay is intended as an aid in the diagnosis of influenza from Nasopharyngeal swab specimens and should not be used as a sole basis for treatment. Nasal washings and aspirates are unacceptable for Xpert Xpress SARS-CoV-2/FLU/RSV testing.  Fact Sheet for Patients: BloggerCourse.com  Fact Sheet for Healthcare Providers: SeriousBroker.it  This test is not yet approved or cleared by the [place]  FDA and has been authorized for detection and/or diagnosis of SARS-CoV-2 by FDA under an Emergency Use Authorization (EUA). This EUA will remain in effect (meaning this test can be used) for the duration of the COVID-19 declaration under Section 564(b)(1) of the Act, 21 U.S.C. section 360bbb-3(b)(1), unless the authorization is terminated or revoked.     Resp Syncytial Virus by PCR NEGATIVE NEGATIVE Final    Comment: (NOTE) Fact Sheet for Patients: BloggerCourse.com  Fact Sheet for Healthcare Providers: SeriousBroker.it  This test is not yet approved or cleared by the [place]  FDA and has been authorized for detection and/or diagnosis of SARS-CoV-2 by FDA under an Emergency Use Authorization (EUA). This EUA will remain in effect (meaning this test can be used) for the duration of the COVID-19 declaration under Section 564(b)(1) of the Act, 21 U.S.C. section 360bbb-3(b)(1), unless the authorization is terminated or revoked.  Performed at [facility]  Lab, [address], [place], [place] [address]     Imaging reviewed:  [facility] Chest Port 1 View Result Date: 01/30/2024 CLINICAL DATA:  Fevers EXAM: PORTABLE CHEST 1 VIEW COMPARISON:  04/20/2022 FINDINGS: Check shadow is stable. Postsurgical changes are noted. Aortic calcifications are seen. Lungs are well aerated bilaterally. No focal infiltrate or effusion is seen. No bony abnormality is noted. IMPRESSION: No active disease. Electronically Signed   By: Violeta Grey M.D.   On: 01/30/2024 21:46   EKG:  Personally reviewed, sinus rhythm, first-degree AV block, RBBB, no acute ischemic changes.    ED Course:  Treated with 2 L IV fluid, Zofran    Assessment/Plan:  89 y.o. male with hx CAD, hx non-STEMI, CABG x 5 (LIMA-LAD, SVG-OM1-3, SVG RCA), hypertension, diabetes type 2, CKD 3B, hyperlipidemia, cognitive impairment, depression, who was brought in due to progressive weakness, recent gastroenteritis, and developing hypotension after admission.   Gastroenteritis  Developing sepsis  Hypotension, suspect hypovolemic, septic  2 days of N/V, and one day of diarrhea. Decreased PO intake. Benign abdominal exam. Noted to have worsening hypotension after admission dropping to 90/50. T max of 38 C. Tachypneic in the mid 20s. WBC 8. Imaging pending for intraabd infection per below. If having ongoing diarrhea, can send for stool testing.  - With developing sepsis, obtain Bcx, start on Ceftriaxone  2 g 24-hour, Flagyl 500 mg IV every 12 hours for empiric intra-abdominal coverage. - S/p 2 L IV fluid.  With worsening hypotension given additional 500 cc.  Remained with borderline hypotension so started on midodrine 5 mg 3 times daily -Check lactate -Obtain CT abdomen pelvis with IV contrast to evaluate for intra-abdominal source of infection -If having ongoing diarrhea sent for GI pathogen and C. Difficile.  Enteric precautions for now.  Chronic medical problems: CAD, hx non-STEMI, CABG x 5 (LIMA-LAD, SVG-OM1-3,  SVG RCA): Continue home aspirin , statin, fibrate Hypertension: Hold home losartan  in setting of hypotension Diabetes type 2: Hold home [place] with volume depletion.  Holding Tradjenta  as well.  SSI for very sensitive while inpatient. CKD 3B: Baseline creatinine near 1.5 Hyperlipidemia: On  statin, fibrate above Cognitive impairment: Noted, not on medication Depression: Noted on her medication,   Body mass index is 24.59 kg/m.    DVT prophylaxis:  SCDs Code Status:  DNR/DNI(Do NOT Intubate); confirmed with patient. Has durable DNR  Diet:  Diet Orders (From admission, onward)     Start     Ordered   01/30/24 2302  Diet clear liquid Room service appropriate? Yes; Fluid consistency: Thin  Diet effective now       Question Answer Comment  Room service appropriate? Yes   Fluid consistency: Thin      01/30/24 2301           Family Communication:  Yes discussed with daughter at bedside   Consults:  None   Admission status:   Observation, Telemetry bed  Severity of Illness: The appropriate patient status for this patient is OBSERVATION. Observation status is judged to be reasonable and necessary in order to provide the required intensity of service to ensure the patient's safety. The patient's presenting symptoms, physical exam findings, and initial radiographic and laboratory data in the context of their medical condition is felt to place them at decreased risk for further clinical deterioration. Furthermore, it is anticipated that the patient will be medically stable for discharge from the hospital within 2 midnights of admission.    Arnulfo Larch, MD [facility]  How to contact the [facility] Attending or Consulting provider 7A - 7P or covering provider during after hours 7P -7A, for this patient.  Check the care team in [facility] and look for a) attending/consulting [facility] provider listed and b) the [facility] team listed Log into [email or web address] and use [place]'s universal password to  access. If you do not have the password, please contact the hospital operator. Locate the [facility] provider you are looking for under [facility] and page to a number that you can be directly reached. If you still have difficulty reaching the provider, please page the [facility] (Director on Call) for the Hospitalists listed on amion for assistance.  01/31/2024, 3:03 AM

## 2024-01-31 NOTE — Plan of Care (Signed)
  Problem: Education: Goal: Knowledge of General Education information will improve Description: Including pain rating scale, medication(s)/side effects and non-pharmacologic comfort measures Outcome: Progressing   Problem: Activity: Goal: Risk for activity intolerance will decrease Outcome: Progressing   Problem: Nutrition: Goal: Adequate nutrition will be maintained Outcome: Progressing   Problem: Coping: Goal: Level of anxiety will decrease Outcome: Progressing   Problem: Elimination: Goal: Will not experience complications related to bowel motility Outcome: Progressing   Problem: Pain Managment: Goal: General experience of comfort will improve and/or be controlled Outcome: Progressing

## 2024-01-31 NOTE — Progress Notes (Signed)
 MD was notified upon arrival to the unit that the Pt needed to be admitted still. Also made the MD aware of the status of the Pt regarding vitals signs. MD placed new orders and instructed me to recheck vitals after the 500ml fluid bolus. Made MD aware of the current vitals at that time and he placed new orders. MD reevaluated the Pt remotely and placed another set of new orders and STAT labs, I made the lab aware of these orders.

## 2024-01-31 NOTE — Progress Notes (Signed)
 Pt off the unit for CT scan

## 2024-01-31 NOTE — TOC Initial Note (Signed)
 Transition of Care [facility]) - Initial/Assessment Note    Patient Details  Name: Tony Lee MRN: [id number] Date of Birth: May 09, 1931  Transition of Care [facility]) CM/SW Contact:    Jeffory Mings, [place] Phone Number: 01/31/2024, 2:06 PM  Clinical Narrative: Pt admitted from [facility] where he lives wife his wife. Pt with some confusion this admission. Spoke to pt's son Georgette Kins re PT recommendation for SNF. Pt's son agreeable to SNF for STR at [facility]. Spoke to [place], [place] at [facility] [phone], who confirmed they have a SNF bed available for pt (room 158) including over weekend. If pt ready for dc over weekend, contact at [facility] is the Nurse Manager [phone] or the unit directly at [phone]. MD and pt's son updated.   Paullette Boston, MSW, LCSW [phone] (coverage)                   Expected Discharge Plan: Assisted Living Barriers to Discharge: Continued Medical Work up   Patient Goals and CMS Choice            Expected Discharge Plan and Services     Post Acute Care Choice: Skilled Nursing Facility Living arrangements for the past 2 months: Assisted Living Facility                                      Prior Living Arrangements/Services Living arrangements for the past 2 months: Assisted Living Facility Lives with:: Spouse Patient language and need for interpreter reviewed:: Yes        Need for Family Participation in Patient Care: Yes (Comment) Care giver support system in place?: Yes (comment)   Criminal Activity/Legal Involvement Pertinent to Current Situation/Hospitalization: No - Comment as needed  Activities of Daily Living   ADL Screening (condition at time of admission) Independently performs ADLs?: Yes (appropriate for developmental age) Is the patient deaf or have difficulty hearing?: No Does the patient have difficulty seeing, even when wearing glasses/contacts?: No Does the patient have difficulty concentrating,  remembering, or making decisions?: No  Permission Sought/Granted Permission sought to share information with : Facility Industrial/product designer granted to share information with : Yes, Verbal Permission Granted              Emotional Assessment       Orientation: : Oriented to Self, Oriented to Place, Oriented to Situation, Fluctuating Orientation (Suspected and/or reported Sundowners) Alcohol  / Substance Use: Not Applicable Psych Involvement: No (comment)  Admission diagnosis:  Dehydration [E86.0] Intractable nausea and vomiting [R11.2] Gastroenteritis [K52.9] Patient Active Problem List   Diagnosis Date Noted   Gastroenteritis 01/31/2024   Sepsis (HCC) 01/31/2024   Intractable nausea and vomiting 01/30/2024   Non-ST elevation (NSTEMI) myocardial infarction [facility])    Unstable angina (HCC) 04/20/2022   Chest pain 04/20/2022   Type 2 diabetes mellitus with obesity (HCC) 08/27/2014   Monoclonal paraproteinemia 03/03/2013   Hereditary and idiopathic peripheral neuropathy 01/29/2013   Ataxia 01/29/2013   Coronary artery disease    Cancer (HCC)    RBBB (right bundle branch block)    Palpitations    Dyslipidemia    PCP:  Marguerite Shiley, MD Pharmacy:   [facility] [place], [place] - [address] [place] [address] Phone: [phone] Fax: [phone]  [id number] - [place], [place] - [address] [place]  [address] Phone: [phone] Fax: [phone]     Social Drivers of Health (SDOH) Social History: SDOH Screenings   Food Insecurity: Patient Declined (01/31/2024)  Housing: Patient Declined (01/31/2024)  Transportation Needs: Patient Declined (01/31/2024)  Utilities: Patient Declined (01/31/2024)  Depression (PHQ2-9): Low Risk  (07/23/2023)  Social Connections: Unknown (01/31/2024)  Tobacco Use: Medium Risk (01/30/2024)   SDOH Interventions:      Readmission Risk Interventions     No data to display

## 2024-01-31 NOTE — Progress Notes (Signed)
 PROGRESS NOTE    Tony Lee  ZOX:[id number] DOB: 1931-01-04 DOA: 01/30/2024 PCP: Marguerite Shiley, MD   Brief Narrative:  HPI:  Tony Lee is a 89 y.o. male with hx of CAD, hx non-STEMI, CABG x 5 (LIMA-LAD, SVG-OM1-3, SVG RCA), hypertension, diabetes type 2, CKD 3B, hyperlipidemia, cognitive impairment, depression, who was brought in due to progressive weakness.  History mainly provided by patient's daughter at bedside.  Reports that he has had about 4 days of progressive weakness.  Typically he is ambulatory with the use of a walker but now unable to sit up in bed or stand.  He has had 2 days of nausea and vomiting and 3 episodes of diarrhea last night.  Decreased p.o. intake.  Otherwise denies any recent illness  Assessment & Plan:   Principal Problem:   Intractable nausea and vomiting Active Problems:   Gastroenteritis   Sepsis (HCC)  Gastroenteritis/intractable nausea vomiting/hypotension/sepsis ruled out. 2 days of N/V, and one day of diarrhea. Decreased PO intake. Benign abdominal exam.  CT abdomen unremarkable for acute pathology.  1 episode of low-grade fever 100.4 with tachypnea but no tachycardia or leukocytosis.  Not sure how authentic the temperature of 100.4 was.  Chest x-ray negative, COVID, influenza and RSV ruled out, UA unremarkable as well.  No clinical signs of cellulitis.  No reports of diarrhea since admission.  C. difficile and GI pathogen panel pending.  Lactic acid is also pending.  Patient has been started on Rocephin  and Flagyl.  I am going to continue those for another 24 hours and await cultures.  Generalized weakness: PT OT consulted.   CAD, hx non-STEMI, CABG x 5 (LIMA-LAD, SVG-OM1-3, SVG RCA): Continue home aspirin , statin, fibrate  Hypertension: Hold home losartan  in setting of hypotension  Diabetes type 2: Hold home [place] with volume depletion.  Holding Tradjenta  as well.  SSI for very sensitive while inpatient.  CKD 3B: Baseline creatinine  near 1.5  Hyperlipidemia: On statin, fibrate above  Cognitive impairment: Noted, not on medication  Depression: Noted on her medication,  DVT prophylaxis: SCDs Start: 01/30/24 2300   Code Status: Limited: Do not attempt resuscitation (DNR) -DNR-LIMITED -Do Not Intubate/DNI   Family Communication:  None present at bedside.  Plan of care discussed with patient in length and he/she verbalized understanding and agreed with it.  Status is: Observation The patient will require care spanning > 2 midnights and should be moved to inpatient because: Had fever less than 24 hours, feels very weak, needs to be assessed by PT OT.   Estimated body mass index is 24.59 kg/m as calculated from the following:   Height as of this encounter: 5\' 7"  (1.702 m).   Weight as of this encounter: 71.2 kg.    Nutritional Assessment: Body mass index is 24.59 kg/m.Tony Lee Seen by dietician.  I agree with the assessment and plan as outlined below: Nutrition Status:        . Skin Assessment: I have examined the patient's skin and I agree with the wound assessment as performed by the wound care RN as outlined below:    Consultants:  None  Procedures:  None  Antimicrobials:  Anti-infectives (From admission, onward)    Start     Dose/Rate Route Frequency Ordered Stop   01/31/24 0300  cefTRIAXone  (ROCEPHIN ) 2 g in sodium chloride  0.9 % 100 mL IVPB        2 g 200 mL/hr over 30 Minutes Intravenous Every 24 hours 01/31/24 0204  01/31/24 0300  metroNIDAZOLE (FLAGYL) IVPB 500 mg        500 mg 100 mL/hr over 60 Minutes Intravenous Every 12 hours 01/31/24 0204           Subjective: Patient seen and examined, fully alert and oriented.  Only complaint he has is generalized weakness.  No more abdominal pain, diarrhea or nausea or vomiting.  Objective: Vitals:   01/30/24 2359 01/31/24 0326 01/31/24 0512 01/31/24 0755  BP: (!) 93/49 (!) 85/49 (!) 95/54 (!) 99/57  Pulse: 78 64 (!) 59 (!) 59  Resp: 19  18  18   Temp: (!) 100.4 F (38 C)  98.5 F (36.9 C)   TempSrc: Oral  Oral   SpO2: 96%  97% 98%  Weight:      Height:        Intake/Output Summary (Last 24 hours) at 01/31/2024 0815 Last data filed at 01/31/2024 0805 Gross per 24 hour  Intake 613.79 ml  Output 400 ml  Net 213.79 ml   Filed Weights   01/30/24 1806 01/30/24 1819  Weight: 75.8 kg 71.2 kg    Examination:  General exam: Appears calm and comfortable  Respiratory system: Clear to auscultation. Respiratory effort normal. Cardiovascular system: S1 & S2 heard, RRR. No JVD, murmurs, rubs, gallops or clicks. No pedal edema. Gastrointestinal system: Abdomen is nondistended, soft and nontender. No organomegaly or masses felt. Normal bowel sounds heard. Central nervous system: Alert and oriented. No focal neurological deficits. Extremities: Symmetric 5 x 5 power. Skin: No rashes, lesions or ulcers Psychiatry: Judgement and insight appear normal. Mood & affect appropriate.    Data Reviewed: I have personally reviewed following labs and imaging studies  CBC: Recent Labs  Lab 01/30/24 1920  WBC 8.6  NEUTROABS 8.0*  HGB 12.1*  HCT 37.3*  MCV 94.0  PLT 230   Basic Metabolic Panel: Recent Labs  Lab 01/30/24 1920  NA 136  K 4.0  CL 105  CO2 22  GLUCOSE 193*  BUN 30*  CREATININE 1.58*  CALCIUM  8.1*   GFR: Estimated Creatinine Clearance: 28.5 mL/min (A) (by C-G formula based on SCr of 1.58 mg/dL (H)). Liver Function Tests: Recent Labs  Lab 01/30/24 1920  AST 24  ALT 13  ALKPHOS 37*  BILITOT 0.4  PROT 5.7*  ALBUMIN 2.9*   Recent Labs  Lab 01/30/24 1920  LIPASE 30   No results for input(s): "AMMONIA" in the last 168 hours. Coagulation Profile: No results for input(s): "INR", "PROTIME" in the last 168 hours. Cardiac Enzymes: No results for input(s): "CKTOTAL", "CKMB", "CKMBINDEX", "TROPONINI" in the last 168 hours. BNP (last 3 results) No results for input(s): "PROBNP" in the last 8760  hours. HbA1C: No results for input(s): "HGBA1C" in the last 72 hours. CBG: No results for input(s): "GLUCAP" in the last 168 hours. Lipid Profile: No results for input(s): "CHOL", "HDL", "LDLCALC", "TRIG", "CHOLHDL", "LDLDIRECT" in the last 72 hours. Thyroid  Function Tests: No results for input(s): "TSH", "T4TOTAL", "FREET4", "T3FREE", "THYROIDAB" in the last 72 hours. Anemia Panel: No results for input(s): "VITAMINB12", "FOLATE", "FERRITIN", "TIBC", "IRON", "RETICCTPCT" in the last 72 hours. Sepsis Labs: No results for input(s): "PROCALCITON", "LATICACIDVEN" in the last 168 hours.  Recent Results (from the past 240 hours)  Resp panel by RT-PCR (RSV, Flu A&B, Covid) Anterior Nasal Swab     Status: None   Collection Time: 01/30/24  9:02 PM   Specimen: Anterior Nasal Swab  Result Value Ref Range Status   SARS Coronavirus 2 by  RT PCR NEGATIVE NEGATIVE Final   Influenza A by PCR NEGATIVE NEGATIVE Final   Influenza B by PCR NEGATIVE NEGATIVE Final    Comment: (NOTE) The Xpert Xpress SARS-CoV-2/FLU/RSV plus assay is intended as an aid in the diagnosis of influenza from Nasopharyngeal swab specimens and should not be used as a sole basis for treatment. Nasal washings and aspirates are unacceptable for Xpert Xpress SARS-CoV-2/FLU/RSV testing.  Fact Sheet for Patients: BloggerCourse.com  Fact Sheet for Healthcare Providers: SeriousBroker.it  This test is not yet approved or cleared by the [place]  FDA and has been authorized for detection and/or diagnosis of SARS-CoV-2 by FDA under an Emergency Use Authorization (EUA). This EUA will remain in effect (meaning this test can be used) for the duration of the COVID-19 declaration under Section 564(b)(1) of the Act, 21 U.S.C. section 360bbb-3(b)(1), unless the authorization is terminated or revoked.     Resp Syncytial Virus by PCR NEGATIVE NEGATIVE Final    Comment: (NOTE) Fact  Sheet for Patients: BloggerCourse.com  Fact Sheet for Healthcare Providers: SeriousBroker.it  This test is not yet approved or cleared by the [place]  FDA and has been authorized for detection and/or diagnosis of SARS-CoV-2 by FDA under an Emergency Use Authorization (EUA). This EUA will remain in effect (meaning this test can be used) for the duration of the COVID-19 declaration under Section 564(b)(1) of the Act, 21 U.S.C. section 360bbb-3(b)(1), unless the authorization is terminated or revoked.  Performed at [facility], [address], [place], [place] [address]   Culture, blood (Routine X 2) w Reflex to ID Panel     Status: None (Preliminary result)   Collection Time: 01/31/24  2:36 AM   Specimen: BLOOD RIGHT ARM  Result Value Ref Range Status   Specimen Description BLOOD RIGHT ARM  Final   Special Requests   Final    BOTTLES DRAWN AEROBIC AND ANAEROBIC Blood Culture adequate volume   Culture   Final    NO GROWTH < 12 HOURS Performed at [facility], [address] [place], [place] [address]    Report Status PENDING  Incomplete  Culture, blood (Routine X 2) w Reflex to ID Panel     Status: None (Preliminary result)   Collection Time: 01/31/24  2:39 AM   Specimen: BLOOD RIGHT ARM  Result Value Ref Range Status   Specimen Description BLOOD RIGHT ARM  Final   Special Requests   Final    BOTTLES DRAWN AEROBIC AND ANAEROBIC Blood Culture adequate volume   Culture   Final    NO GROWTH < 12 HOURS Performed at [facility], [address], [place], [place] [address]    Report Status PENDING  Incomplete     Radiology Studies: CT ABDOMEN PELVIS W CONTRAST Result Date: 01/31/2024 CLINICAL DATA:  Sepsis. Gastroenteritis. Evaluate for intra-abdominal source of infection. EXAM: CT ABDOMEN AND PELVIS WITH CONTRAST TECHNIQUE: Multidetector CT imaging of the abdomen and pelvis was performed using the standard  protocol following bolus administration of intravenous contrast. RADIATION DOSE REDUCTION: This exam was performed according to the departmental dose-optimization program which includes automated exposure control, adjustment of the mA and/or kV according to patient size and/or use of iterative reconstruction technique. CONTRAST:  75mL OMNIPAQUE  IOHEXOL  350 MG/ML SOLN COMPARISON:  No comparison studies available. FINDINGS: Lower chest: Calcified granuloma noted right lower lobe. Dependent atelectasis noted in the lung bases. Hepatobiliary: No suspicious focal abnormality within the liver parenchyma. There is no evidence for  gallstones, gallbladder wall thickening, or pericholecystic fluid. No intrahepatic or extrahepatic biliary dilation. Pancreas: Pancreatic duct in the body and tail of the pancreas is dilated with atrophy of the overlying pancreatic parenchyma. There is abrupt ductal cut off at the level of the pancreatic neck (axial 24/4 and coronal 62/7. Dystrophic calcification is seen in the pancreatic tail. No obstructing mass lesion is evident by CT and given the findings suggesting a history of chronic pancreatitis, the ductal dilatation may be secondary to chronic inflammation. Occult neoplasm is not excluded. Spleen: Heterogeneous parenchyma with innumerable tiny hypoattenuating lesions in scattered calcified granulomata. Adrenals/Urinary Tract: No adrenal nodule or mass. Cortical thinning noted in both kidneys without hydronephrosis or suspicious renal mass. No evidence for hydroureter. The urinary bladder appears normal for the degree of distention. Stomach/Bowel: Stomach is decompressed which accentuates wall thickness. Duodenum is normally positioned as is the ligament of Treitz. No small bowel wall thickening. No small bowel dilatation. The terminal ileum is normal. Nonvisualization of the appendix is consistent with the reported history of appendectomy. No gross colonic mass. No colonic wall  thickening. Vascular/Lymphatic: Focal infrarenal saccular aneurysmal dilatation of the abdominal aorta noted with maximum orthogonal diameters of 4.2 by 3.5 cm There is no gastrohepatic or hepatoduodenal ligament lymphadenopathy. No retroperitoneal or mesenteric lymphadenopathy. No pelvic sidewall lymphadenopathy. Reproductive: Prostatectomy. Other: No intraperitoneal free fluid. Musculoskeletal: Right groin hernia contains only fat. Surgical clips are seen in the groin regions bilaterally, right greater than left. No worrisome lytic or sclerotic osseous abnormality. Advanced degenerative disc disease noted in the lumbar spine. IMPRESSION: 1. No acute findings in the abdomen or pelvis. Specifically, no findings to explain the patient's history of sepsis. 2. Pancreatic duct in the body and tail of the pancreas is dilated with atrophy of the overlying pancreatic parenchyma. There is abrupt ductal cut off at the level of the pancreatic neck. No obstructing mass lesion is evident by CT and given the findings suggesting a history of chronic pancreatitis, the ductal dilatation may be secondary to chronic inflammation. Occult neoplasm is not excluded. Close follow-up warranted. Given the lack of mass effect in this region the pancreas, MRI of the abdomen with and without contrast may be of minimal benefit. 3. Heterogeneous parenchyma of the spleen with innumerable tiny hypoattenuating lesions., nonspecific. 4. 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 Recommend follow-up CT or MR as appropriate in 12 months and referral to or continued care with vascular specialist. (Ref.: J Vasc Surg. 2018; 67:2-77 and J Am Coll Radiol 2013;10(10):789-794.) 5. Right groin hernia contains only fat. Electronically Signed   By: Donnal Fusi M.D.   On: 01/31/2024 06:15   DG Chest Port 1 View Result Date: 01/30/2024 CLINICAL DATA:  Fevers EXAM: PORTABLE CHEST 1  VIEW COMPARISON:  04/20/2022 FINDINGS: Check shadow is stable. Postsurgical changes are noted. Aortic calcifications are seen. Lungs are well aerated bilaterally. No focal infiltrate or effusion is seen. No bony abnormality is noted. IMPRESSION: No active disease. Electronically Signed   By: Violeta Grey M.D.   On: 01/30/2024 21:46    Scheduled Meds:  aspirin  EC  81 mg Oral Daily   fenofibrate   54 mg Oral Daily   insulin  aspart  0-6 Units Subcutaneous TID WC   midodrine  5 mg Oral TID WC   simvastatin   20 mg Oral QHS   Continuous Infusions:  cefTRIAXone  (ROCEPHIN )  IV     metronidazole 500 mg (01/31/24  1610)     LOS: 0 days   Modena Andes, MD [facility]  01/31/2024, 8:15 AM   *Please note that this is a verbal dictation therefore any spelling or grammatical errors are due to the "Dragon Medical One" system interpretation.  Please page via Amion and do not message via secure chat for urgent patient care matters. Secure chat can be used for non urgent patient care matters.  How to contact the [facility] Attending or Consulting provider 7A - 7P or covering provider during after hours 7P -7A, for this patient?  Check the care team in [facility] and look for a) attending/consulting [facility] provider listed and b) the [facility] team listed. Page or secure chat 7A-7P. Log into [email or web address] and use [place]'s universal password to access. If you do not have the password, please contact the hospital operator. Locate the [facility] provider you are looking for under [facility] and page to a number that you can be directly reached. If you still have difficulty reaching the provider, please page the [facility] (Director on Call) for the Hospitalists listed on amion for assistance.

## 2024-02-01 DIAGNOSIS — R531 Weakness: Secondary | ICD-10-CM

## 2024-02-01 DIAGNOSIS — R112 Nausea with vomiting, unspecified: Secondary | ICD-10-CM | POA: Diagnosis not present

## 2024-02-01 LAB — CBC WITH DIFFERENTIAL/PLATELET
Abs Immature Granulocytes: 0.03 10*3/uL (ref 0.00–0.07)
Basophils Absolute: 0 10*3/uL (ref 0.0–0.1)
Basophils Relative: 0 %
Eosinophils Absolute: 0 10*3/uL (ref 0.0–0.5)
Eosinophils Relative: 1 %
HCT: 33.4 % — ABNORMAL LOW (ref 39.0–52.0)
Hemoglobin: 10.8 g/dL — ABNORMAL LOW (ref 13.0–17.0)
Immature Granulocytes: 0 %
Lymphocytes Relative: 9 %
Lymphs Abs: 0.7 10*3/uL (ref 0.7–4.0)
MCH: 30.1 pg (ref 26.0–34.0)
MCHC: 32.3 g/dL (ref 30.0–36.0)
MCV: 93 fL (ref 80.0–100.0)
Monocytes Absolute: 0.6 10*3/uL (ref 0.1–1.0)
Monocytes Relative: 8 %
Neutro Abs: 6.5 10*3/uL (ref 1.7–7.7)
Neutrophils Relative %: 82 %
Platelets: 199 10*3/uL (ref 150–400)
RBC: 3.59 MIL/uL — ABNORMAL LOW (ref 4.22–5.81)
RDW: 13.2 % (ref 11.5–15.5)
WBC: 7.9 10*3/uL (ref 4.0–10.5)
nRBC: 0 % (ref 0.0–0.2)

## 2024-02-01 LAB — C DIFFICILE QUICK SCREEN W PCR REFLEX
C Diff antigen: NEGATIVE
C Diff interpretation: NOT DETECTED
C Diff toxin: NEGATIVE

## 2024-02-01 LAB — HEMOGLOBIN A1C
Hgb A1c MFr Bld: 6.9 % — ABNORMAL HIGH (ref 4.8–5.6)
Mean Plasma Glucose: 151.33 mg/dL

## 2024-02-01 LAB — BASIC METABOLIC PANEL WITH GFR
Anion gap: 7 (ref 5–15)
BUN: 21 mg/dL (ref 8–23)
CO2: 21 mmol/L — ABNORMAL LOW (ref 22–32)
Calcium: 7.4 mg/dL — ABNORMAL LOW (ref 8.9–10.3)
Chloride: 106 mmol/L (ref 98–111)
Creatinine, Ser: 1.31 mg/dL — ABNORMAL HIGH (ref 0.61–1.24)
GFR, Estimated: 51 mL/min — ABNORMAL LOW (ref >60)
Glucose, Bld: 118 mg/dL — ABNORMAL HIGH (ref 70–99)
Potassium: 3.9 mmol/L (ref 3.5–5.1)
Sodium: 134 mmol/L — ABNORMAL LOW (ref 135–145)

## 2024-02-01 LAB — GLUCOSE, CAPILLARY: Glucose-Capillary: 121 mg/dL — ABNORMAL HIGH (ref 70–99)

## 2024-02-01 NOTE — Discharge Summary (Signed)
 Physician Discharge Summary  Tony Lee WGN:[id number] DOB: 04/20/1931 DOA: 01/30/2024  PCP: Marguerite Shiley, MD  Admit date: 01/30/2024 Discharge date: 02/01/2024 30 Day Unplanned Readmission Risk Score    Flowsheet Row ED to Hosp-Admission (Current) from 01/30/2024 in  [facility]  SURGICAL  30 Day Unplanned Readmission Risk Score (%) 18.82 Filed at 02/01/2024 0800       This score is the patient's risk of an unplanned readmission within 30 days of being discharged (0 -100%). The score is based on dignosis, age, lab data, medications, orders, and past utilization.   Low:  0-14.9   Medium: 15-21.9   High: 22-29.9   Extreme: 30 and above          Admitted From: Assisted living facility Disposition: SNF  Recommendations for Outpatient Follow-up:  Follow up with PCP in 1-2 weeks Please obtain BMP/CBC in one week Please follow up with your PCP on the following pending results: Unresulted Labs (From admission, onward)     Start     Ordered   02/01/24 0934  Hemoglobin A1c  Add-on,   AD        02/01/24 0933   01/31/24 0323  Gastrointestinal Panel by PCR , Stool  (Gastrointestinal Panel by PCR, Stool                                                                                                                                                     **Does Not include CLOSTRIDIUM DIFFICILE testing. **If CDIFF testing is needed, place order from the "C Difficile Testing" order set.**)  Once,   R        01/31/24 0322              Home Health: None Equipment/Devices: None  Discharge Condition: Stable CODE STATUS: DNR Diet recommendation: Cardiac  Subjective: Seen and examined.  Feeling well.  Weakness improved.  No nausea vomiting diarrhea or abdominal pain.  He is fully alert and oriented.  Brief/Interim Summary: Tony Lee is a 89 y.o. male with hx of CAD, hx non-STEMI, CABG x 5 (LIMA-LAD, SVG-OM1-3, SVG RCA), hypertension, diabetes type 2, CKD  3B, hyperlipidemia, cognitive impairment, depression, who was brought in due to progressive weakness for 4 days.  Typically he is ambulatory with the use of a walker but now unable to sit up in bed or stand.  He has had 2 days of nausea and vomiting and 3 episodes of diarrhea as well as decreased p.o. intake.  Otherwise denies any recent illness.  He was hospitalized under hospitalist service.  Details below.   Gastroenteritis/intractable nausea vomiting/hypotension/sepsis ruled out.  CT abdomen unremarkable for acute pathology.  1 episode of low-grade fever 100.4 with tachypnea but no tachycardia or leukocytosis.  Doubt authenticity of the temperature since patient had no other signs  or symptoms of infection and he has remained afebrile since 36 hours now.  Chest x-ray negative, COVID, influenza and RSV ruled out, UA unremarkable as well.  No clinical signs of cellulitis.  No reports of diarrhea since admission.  C. difficile ruled out and GI pathogen panel is still pending at the time of dictation however with patient having no abdominal pain, nausea vomiting or diarrhea. Lactic acid normal.  Patient was started on Rocephin  due to concern of possible sepsis with unknown source however now that he does not appear to have any infection, we are not going to prescribe any antibiotics at discharge.   Generalized weakness: PT OT evaluated and recommended SNF which has been arranged for him and he is going to be discharged in stable condition today.   CAD, hx non-STEMI, CABG x 5 (LIMA-LAD, SVG-OM1-3, SVG RCA): Continue home aspirin , statin, fibrate   Hypertension: History of hypertension on losartan  PTA however blood pressure remained low and losartan  was held and in this case, we are going to discontinue losartan  at discharge.   Diabetes type 2: Resume home medications.   CKD 3B: Baseline creatinine near 1.5   Hyperlipidemia: On statin, fibrate above   Cognitive impairment: Noted, not on medication    Depression: Noted on her medication,    Discharge plan was discussed with patient and/or family member and they verbalized understanding and agreed with it.  Discharge Diagnoses:  Principal Problem:   Intractable nausea and vomiting Active Problems:   Dyslipidemia   Type 2 diabetes mellitus with obesity (HCC)   Gastroenteritis   Generalized weakness    Discharge Instructions   Allergies as of 02/01/2024       Reactions   Other Itching    Mepergan Fortis, (meprozine)  = itching (patient disputes this in 2023)        Medication List     STOP taking these medications    losartan  25 MG tablet Commonly known as: COZAAR    Voltaren 1 % Gel Generic drug: diclofenac Sodium       TAKE these medications    aspirin  EC 81 MG tablet Take 1 tablet (81 mg total) by mouth daily. Swallow whole.   bismuth subsalicylate 262 MG chewable tablet Commonly known as: PEPTO BISMOL Chew 524 mg by mouth as needed.   Black Pepper-Turmeric 3-500 MG Caps Take 1 capsule by mouth every Monday, Wednesday, and Friday.   docusate sodium  100 MG capsule Commonly known as: COLACE Take 100 mg by mouth 2 (two) times daily.   Farxiga 5 MG Tabs tablet Generic drug: dapagliflozin propanediol Take 5 mg by mouth daily.   fenofibrate  48 MG tablet Commonly known as: Tricor  Take 1 tablet (48 mg total) by mouth daily.   ipratropium 0.03 % nasal spray Commonly known as: ATROVENT  Place 2 sprays into both nostrils 3 (three) times daily. What changed:  when to take this reasons to take this   loratadine 10 MG tablet Commonly known as: CLARITIN Take 10 mg by mouth daily.   meclizine  12.5 MG tablet Commonly known as: ANTIVERT  Take 1 tablet (12.5 mg total) by mouth 3 (three) times daily as needed for dizziness.   nitroGLYCERIN  0.4 MG SL tablet Commonly known as: Nitrostat  PLACE 1 TABLET (0.4 MG TOTAL) UNDER THE TONGUE EVERY 5 (FIVE) MINUTES AS NEEDED. What changed:  how much to take how  to take this when to take this reasons to take this additional instructions   omeprazole 40 MG capsule Commonly known as: PRILOSEC  Take 40 mg by mouth 2 (two) times daily.   ondansetron  4 MG tablet Commonly known as: ZOFRAN  Take 4 mg by mouth every 8 (eight) hours as needed for nausea or vomiting.   polyethylene glycol 17 g packet Commonly known as: MIRALAX / GLYCOLAX Take 17 g by mouth daily as needed for mild constipation, moderate constipation or severe constipation.   PreserVision AREDS 2 Caps Take 1 capsule by mouth in the morning and at bedtime.   QC TUMERIC COMPLEX PO Take 3-500 mg by mouth every Monday, Wednesday, and Friday with hemodialysis.   simvastatin  20 MG tablet Commonly known as: ZOCOR  Take 20 mg by mouth at bedtime.   Tradjenta  5 MG Tabs tablet Generic drug: linagliptin  Take 5 mg by mouth daily.   VITAMIN B-12 PO Take 5,000 mcg by mouth at bedtime.        Contact information for follow-up providers     Marguerite Shiley, MD Follow up in 1 week(s).   Specialty: Internal Medicine Contact information: [address] [place] [address] [phone]              Contact information for after-discharge care     Destination     [facility] .   Service: Skilled Nursing Contact information: [address] [place]  [address] [phone]                    Allergies  Allergen Reactions   Other Itching     Mepergan Fortis, (meprozine)  = itching (patient disputes this in 2023)    Consultations: None   Procedures/Studies: CT ABDOMEN PELVIS W CONTRAST Result Date: 01/31/2024 CLINICAL DATA:  Sepsis. Gastroenteritis. Evaluate for intra-abdominal source of infection. EXAM: CT ABDOMEN AND PELVIS WITH CONTRAST TECHNIQUE: Multidetector CT imaging of the abdomen and pelvis was performed using the standard protocol following bolus administration of intravenous contrast. RADIATION  DOSE REDUCTION: This exam was performed according to the departmental dose-optimization program which includes automated exposure control, adjustment of the mA and/or kV according to patient size and/or use of iterative reconstruction technique. CONTRAST:  75mL OMNIPAQUE  IOHEXOL  350 MG/ML SOLN COMPARISON:  No comparison studies available. FINDINGS: Lower chest: Calcified granuloma noted right lower lobe. Dependent atelectasis noted in the lung bases. Hepatobiliary: No suspicious focal abnormality within the liver parenchyma. There is no evidence for gallstones, gallbladder wall thickening, or pericholecystic fluid. No intrahepatic or extrahepatic biliary dilation. Pancreas: Pancreatic duct in the body and tail of the pancreas is dilated with atrophy of the overlying pancreatic parenchyma. There is abrupt ductal cut off at the level of the pancreatic neck (axial 24/4 and coronal 62/7. Dystrophic calcification is seen in the pancreatic tail. No obstructing mass lesion is evident by CT and given the findings suggesting a history of chronic pancreatitis, the ductal dilatation may be secondary to chronic inflammation. Occult neoplasm is not excluded. Spleen: Heterogeneous parenchyma with innumerable tiny hypoattenuating lesions in scattered calcified granulomata. Adrenals/Urinary Tract: No adrenal nodule or mass. Cortical thinning noted in both kidneys without hydronephrosis or suspicious renal mass. No evidence for hydroureter. The urinary bladder appears normal for the degree of distention. Stomach/Bowel: Stomach is decompressed which accentuates wall thickness. Duodenum is normally positioned as is the ligament of Treitz. No small bowel wall thickening. No small bowel dilatation. The terminal ileum is normal. Nonvisualization of the appendix is consistent with the reported history of appendectomy. No gross colonic mass. No colonic wall thickening. Vascular/Lymphatic: Focal infrarenal saccular aneurysmal  dilatation of  the abdominal aorta noted with maximum orthogonal diameters of 4.2 by 3.5 cm There is no gastrohepatic or hepatoduodenal ligament lymphadenopathy. No retroperitoneal or mesenteric lymphadenopathy. No pelvic sidewall lymphadenopathy. Reproductive: Prostatectomy. Other: No intraperitoneal free fluid. Musculoskeletal: Right groin hernia contains only fat. Surgical clips are seen in the groin regions bilaterally, right greater than left. No worrisome lytic or sclerotic osseous abnormality. Advanced degenerative disc disease noted in the lumbar spine. IMPRESSION: 1. No acute findings in the abdomen or pelvis. Specifically, no findings to explain the patient's history of sepsis. 2. Pancreatic duct in the body and tail of the pancreas is dilated with atrophy of the overlying pancreatic parenchyma. There is abrupt ductal cut off at the level of the pancreatic neck. No obstructing mass lesion is evident by CT and given the findings suggesting a history of chronic pancreatitis, the ductal dilatation may be secondary to chronic inflammation. Occult neoplasm is not excluded. Close follow-up warranted. Given the lack of mass effect in this region the pancreas, MRI of the abdomen with and without contrast may be of minimal benefit. 3. Heterogeneous parenchyma of the spleen with innumerable tiny hypoattenuating lesions., nonspecific. 4. 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 Recommend follow-up CT or MR as appropriate in 12 months and referral to or continued care with vascular specialist. (Ref.: J Vasc Surg. 2018; 67:2-77 and J Am Coll Radiol 2013;10(10):789-794.) 5. Right groin hernia contains only fat. Electronically Signed   By: Donnal Fusi M.D.   On: 01/31/2024 06:15   DG Chest Port 1 View Result Date: 01/30/2024 CLINICAL DATA:  Fevers EXAM: PORTABLE CHEST 1 VIEW COMPARISON:  04/20/2022 FINDINGS: Check shadow is stable. Postsurgical  changes are noted. Aortic calcifications are seen. Lungs are well aerated bilaterally. No focal infiltrate or effusion is seen. No bony abnormality is noted. IMPRESSION: No active disease. Electronically Signed   By: Violeta Grey M.D.   On: 01/30/2024 21:46   LONG TERM MONITOR (3-14 DAYS) Result Date: 01/15/2024   Normal sinus rhythm with first degree AV block. lowest HR at 4 am   One 6 beat run NSVT   Otherwise rare PAC and PVC   No significant tachy or bradyarrhythmia or AV block. no Afib Patch Wear Time:  13 days and 23 hours (2025-04-13T09:09:21-0400 to 2025-04-27T09:09:13-0400) Patient had a min HR of 43 bpm, max HR of 127 bpm, and avg HR of 75 bpm. Predominant underlying rhythm was Sinus Rhythm. First Degree AV Block was present. Bundle Branch Block/IVCD was present. 1 Ventricular Tachycardia runs occurred, the run with the fastest interval lasting 6 beats with a max rate of 124 bpm, the longest lasting 6 beats with an avg rate of 116 bpm. Isolated SVEs were rare (<1.0%), SVE Couplets were rare (<1.0%), and no SVE Triplets were present. Isolated VEs were rare (<1.0%), VE Couplets were rare (<1.0%), and no VE Triplets were present. Ventricular Bigeminy was present.     Discharge Exam: Vitals:   02/01/24 0436 02/01/24 0700  BP: (!) 103/44 (!) 101/35  Pulse: 76 71  Resp: 17 17  Temp: 99 F (37.2 C) 97.8 F (36.6 C)  SpO2: 99% 96%   Vitals:   01/31/24 1517 01/31/24 2034 02/01/24 0436 02/01/24 0700  BP: 100/61 113/64 (!) 103/44 (!) 101/35  Pulse: 66 60 76 71  Resp: 18 16 17 17   Temp: 98.3 F (36.8 C) 98.3 F (36.8 C) 99 F (37.2 C) 97.8 F (  36.6 C)  TempSrc: Oral Oral Oral Oral  SpO2: 99% 96% 99% 96%  Weight:      Height:        General: Pt is alert, awake, not in acute distress Cardiovascular: RRR, S1/S2 +, no rubs, no gallops Respiratory: CTA bilaterally, no wheezing, no rhonchi Abdominal: Soft, NT, ND, bowel sounds + Extremities: no edema, no cyanosis    The results of  significant diagnostics from this hospitalization (including imaging, microbiology, ancillary and laboratory) are listed below for reference.     Microbiology: Recent Results (from the past 240 hours)  Resp panel by RT-PCR (RSV, Flu A&B, Covid) Anterior Nasal Swab     Status: None   Collection Time: 01/30/24  9:02 PM   Specimen: Anterior Nasal Swab  Result Value Ref Range Status   SARS Coronavirus 2 by RT PCR NEGATIVE NEGATIVE Final   Influenza A by PCR NEGATIVE NEGATIVE Final   Influenza B by PCR NEGATIVE NEGATIVE Final    Comment: (NOTE) The Xpert Xpress SARS-CoV-2/FLU/RSV plus assay is intended as an aid in the diagnosis of influenza from Nasopharyngeal swab specimens and should not be used as a sole basis for treatment. Nasal washings and aspirates are unacceptable for Xpert Xpress SARS-CoV-2/FLU/RSV testing.  Fact Sheet for Patients: BloggerCourse.com  Fact Sheet for Healthcare Providers: SeriousBroker.it  This test is not yet approved or cleared by the [place]  FDA and has been authorized for detection and/or diagnosis of SARS-CoV-2 by FDA under an Emergency Use Authorization (EUA). This EUA will remain in effect (meaning this test can be used) for the duration of the COVID-19 declaration under Section 564(b)(1) of the Act, 21 U.S.C. section 360bbb-3(b)(1), unless the authorization is terminated or revoked.     Resp Syncytial Virus by PCR NEGATIVE NEGATIVE Final    Comment: (NOTE) Fact Sheet for Patients: BloggerCourse.com  Fact Sheet for Healthcare Providers: SeriousBroker.it  This test is not yet approved or cleared by the [place]  FDA and has been authorized for detection and/or diagnosis of SARS-CoV-2 by FDA under an Emergency Use Authorization (EUA). This EUA will remain in effect (meaning this test can be used) for the duration of the COVID-19  declaration under Section 564(b)(1) of the Act, 21 U.S.C. section 360bbb-3(b)(1), unless the authorization is terminated or revoked.  Performed at [facility], [address], [place], [place] [address]   Culture, blood (Routine X 2) w Reflex to ID Panel     Status: None (Preliminary result)   Collection Time: 01/31/24  2:36 AM   Specimen: BLOOD RIGHT ARM  Result Value Ref Range Status   Specimen Description BLOOD RIGHT ARM  Final   Special Requests   Final    BOTTLES DRAWN AEROBIC AND ANAEROBIC Blood Culture adequate volume   Culture   Final    NO GROWTH 1 DAY Performed at [facility], [address] [place], [place] [address]    Report Status PENDING  Incomplete  Culture, blood (Routine X 2) w Reflex to ID Panel     Status: None (Preliminary result)   Collection Time: 01/31/24  2:39 AM   Specimen: BLOOD RIGHT ARM  Result Value Ref Range Status   Specimen Description BLOOD RIGHT ARM  Final   Special Requests   Final    BOTTLES DRAWN AEROBIC AND ANAEROBIC Blood Culture adequate volume   Culture   Final    NO GROWTH 1 DAY Performed at [facility], [address] [place],  [place] [address]    Report Status PENDING  Incomplete  C Difficile Quick Screen w PCR reflex     Status: None   Collection Time: 01/31/24  3:23 AM   Specimen: Stool  Result Value Ref Range Status   C Diff antigen NEGATIVE NEGATIVE Final   C Diff toxin NEGATIVE NEGATIVE Final   C Diff interpretation No C. difficile detected.  Final    Comment: Performed at [facility], [address] [place], [place] [address]     Labs: BNP (last 3 results) No results for input(s): "BNP" in the last 8760 hours. Basic Metabolic Panel: Recent Labs  Lab 01/30/24 1920 02/01/24 0501  NA 136 134*  K 4.0 3.9  CL 105 106  CO2 22 21*  GLUCOSE 193* 118*  BUN 30* 21  CREATININE 1.58* 1.31*  CALCIUM  8.1* 7.4*   Liver Function Tests: Recent Labs  Lab 01/30/24 1920  AST 24  ALT 13   ALKPHOS 37*  BILITOT 0.4  PROT 5.7*  ALBUMIN 2.9*   Recent Labs  Lab 01/30/24 1920  LIPASE 30   No results for input(s): "AMMONIA" in the last 168 hours. CBC: Recent Labs  Lab 01/30/24 1920 02/01/24 0501  WBC 8.6 7.9  NEUTROABS 8.0* 6.5  HGB 12.1* 10.8*  HCT 37.3* 33.4*  MCV 94.0 93.0  PLT 230 199   Cardiac Enzymes: No results for input(s): "CKTOTAL", "CKMB", "CKMBINDEX", "TROPONINI" in the last 168 hours. BNP: Invalid input(s): "POCBNP" CBG: Recent Labs  Lab 01/31/24 1250 01/31/24 1514 01/31/24 2023 02/01/24 0734  GLUCAP 126* 118* 154* 121*   D-Dimer No results for input(s): "DDIMER" in the last 72 hours. Hgb A1c No results for input(s): "HGBA1C" in the last 72 hours. Lipid Profile No results for input(s): "CHOL", "HDL", "LDLCALC", "TRIG", "CHOLHDL", "LDLDIRECT" in the last 72 hours. Thyroid  function studies No results for input(s): "TSH", "T4TOTAL", "T3FREE", "THYROIDAB" in the last 72 hours.  Invalid input(s): "FREET3" Anemia work up No results for input(s): "VITAMINB12", "FOLATE", "FERRITIN", "TIBC", "IRON", "RETICCTPCT" in the last 72 hours. Urinalysis    Component Value Date/Time   COLORURINE YELLOW 01/30/2024 1920   APPEARANCEUR CLEAR 01/30/2024 1920   LABSPEC 1.018 01/30/2024 1920   PHURINE 5.0 01/30/2024 1920   GLUCOSEU >=500 (A) 01/30/2024 1920   HGBUR NEGATIVE 01/30/2024 1920   BILIRUBINUR NEGATIVE 01/30/2024 1920   KETONESUR NEGATIVE 01/30/2024 1920   PROTEINUR NEGATIVE 01/30/2024 1920   UROBILINOGEN 0.2 07/04/2007 1202   NITRITE NEGATIVE 01/30/2024 1920   LEUKOCYTESUR NEGATIVE 01/30/2024 1920   Sepsis Labs Recent Labs  Lab 01/30/24 1920 02/01/24 0501  WBC 8.6 7.9   Microbiology Recent Results (from the past 240 hours)  Resp panel by RT-PCR (RSV, Flu A&B, Covid) Anterior Nasal Swab     Status: None   Collection Time: 01/30/24  9:02 PM   Specimen: Anterior Nasal Swab  Result Value Ref Range Status   SARS Coronavirus 2 by RT PCR  NEGATIVE NEGATIVE Final   Influenza A by PCR NEGATIVE NEGATIVE Final   Influenza B by PCR NEGATIVE NEGATIVE Final    Comment: (NOTE) The Xpert Xpress SARS-CoV-2/FLU/RSV plus assay is intended as an aid in the diagnosis of influenza from Nasopharyngeal swab specimens and should not be used as a sole basis for treatment. Nasal washings and aspirates are unacceptable for Xpert Xpress SARS-CoV-2/FLU/RSV testing.  Fact Sheet for Patients: BloggerCourse.com  Fact Sheet for Healthcare Providers: SeriousBroker.it  This test is not yet approved or cleared by the [place]  [place] FDA and has been authorized for detection and/or diagnosis of SARS-CoV-2 by FDA under an Emergency Use Authorization (EUA). This EUA will remain in effect (meaning this test can be used) for the duration of the COVID-19 declaration under Section 564(b)(1) of the Act, 21 U.S.C. section 360bbb-3(b)(1), unless the authorization is terminated or revoked.     Resp Syncytial Virus by PCR NEGATIVE NEGATIVE Final    Comment: (NOTE) Fact Sheet for Patients: BloggerCourse.com  Fact Sheet for Healthcare Providers: SeriousBroker.it  This test is not yet approved or cleared by the [place]  FDA and has been authorized for detection and/or diagnosis of SARS-CoV-2 by FDA under an Emergency Use Authorization (EUA). This EUA will remain in effect (meaning this test can be used) for the duration of the COVID-19 declaration under Section 564(b)(1) of the Act, 21 U.S.C. section 360bbb-3(b)(1), unless the authorization is terminated or revoked.  Performed at [facility], [address] [place], [place] [address]   Culture, blood (Routine X 2) w Reflex to ID Panel     Status: None (Preliminary result)   Collection Time: 01/31/24  2:36 AM   Specimen: BLOOD RIGHT ARM  Result Value Ref Range Status   Specimen Description  BLOOD RIGHT ARM  Final   Special Requests   Final    BOTTLES DRAWN AEROBIC AND ANAEROBIC Blood Culture adequate volume   Culture   Final    NO GROWTH 1 DAY Performed at [facility], [address], [place], [place] [address]    Report Status PENDING  Incomplete  Culture, blood (Routine X 2) w Reflex to ID Panel     Status: None (Preliminary result)   Collection Time: 01/31/24  2:39 AM   Specimen: BLOOD RIGHT ARM  Result Value Ref Range Status   Specimen Description BLOOD RIGHT ARM  Final   Special Requests   Final    BOTTLES DRAWN AEROBIC AND ANAEROBIC Blood Culture adequate volume   Culture   Final    NO GROWTH 1 DAY Performed at [facility], [address] [place], [place] [address]    Report Status PENDING  Incomplete  C Difficile Quick Screen w PCR reflex     Status: None   Collection Time: 01/31/24  3:23 AM   Specimen: Stool  Result Value Ref Range Status   C Diff antigen NEGATIVE NEGATIVE Final   C Diff toxin NEGATIVE NEGATIVE Final   C Diff interpretation No C. difficile detected.  Final    Comment: Performed at [facility], [address] [place], [place] [address]    FURTHER DISCHARGE INSTRUCTIONS:   Get Medicines reviewed and adjusted: Please take all your medications with you for your next visit with your Primary MD   Laboratory/radiological data: Please request your Primary MD to go over all hospital tests and procedure/radiological results at the follow up, please ask your Primary MD to get all Hospital records sent to his/her office.   In some cases, they will be blood work, cultures and biopsy results pending at the time of your discharge. Please request that your primary care M.D. goes through all the records of your hospital data and follows up on these results.   Also Note the following: If you experience worsening of your admission symptoms, develop shortness of breath, life threatening emergency, suicidal or homicidal thoughts you  must seek medical attention immediately by calling 911 or calling your MD immediately  if symptoms less severe.  You must read complete instructions/literature along with all the possible adverse reactions/side effects for all the Medicines you take and that have been prescribed to you. Take any new Medicines after you have completely understood and accpet all the possible adverse reactions/side effects.    Do not drive when taking Pain medications or sleeping medications (Benzodaizepines)   Do not take more than prescribed Pain, Sleep and Anxiety Medications. It is not advisable to combine anxiety,sleep and pain medications without talking with your primary care practitioner   Special Instructions: If you have smoked or chewed Tobacco  in the last 2 yrs please stop smoking, stop any regular Alcohol   and or any Recreational drug use.   Wear Seat belts while driving.   Please note: You were cared for by a hospitalist during your hospital stay. Once you are discharged, your primary care physician will handle any further medical issues. Please note that NO REFILLS for any discharge medications will be authorized once you are discharged, as it is imperative that you return to your primary care physician (or establish a relationship with a primary care physician if you do not have one) for your post hospital discharge needs so that they can reassess your need for medications and monitor your lab values  Time coordinating discharge: Over 30 minutes  SIGNED:   Modena Andes, MD  [facility] 02/01/2024, 9:35 AM *Please note that this is a verbal dictation therefore any spelling or grammatical errors are due to the "Dragon Medical One" system interpretation. If 7PM-7AM, please contact night-coverage [email or web address]

## 2024-02-01 NOTE — TOC Transition Note (Signed)
 Transition of Care [facility]) - Discharge Note   Patient Details  Name: Tony Lee MRN: [id number] Date of Birth: [birth year]  Transition of Care [facility]) CM/SW Contact:  Claudean Crumbly, LCSWA Phone Number: 02/01/2024, 11:11 AM   Clinical Narrative:     SW spoke with Mariah Shines [facility] [phone]) confirmed able to accept. Bed: 158 Call Report: [phone]  SW spoke with pt's son Georgette Kins [phone]) to update  PTAR called.  Final next level of care: Skilled Nursing Facility Barriers to Discharge: Barriers Resolved   Patient Goals and CMS Choice            Discharge Placement                Patient to be transferred to facility by: PTAR Name of family member notified: [name] Patient and family notified of of transfer: 02/01/24  Discharge Plan and Services Additional resources added to the After Visit Summary for       Post Acute Care Choice: Skilled Nursing Facility                               Social Drivers of Health (SDOH) Interventions SDOH Screenings   Food Insecurity: Patient Declined (01/31/2024)  Housing: Patient Declined (01/31/2024)  Transportation Needs: Patient Declined (01/31/2024)  Utilities: Patient Declined (01/31/2024)  Depression (PHQ2-9): Low Risk  (07/23/2023)  Social Connections: Unknown (01/31/2024)  Tobacco Use: Medium Risk (01/30/2024)     Readmission Risk Interventions     No data to display

## 2024-02-01 NOTE — Progress Notes (Signed)
 Sent provider a message regarding a new order for cardiac monitoring, see new orders.

## 2024-02-01 NOTE — Plan of Care (Signed)

## 2024-02-02 LAB — GASTROINTESTINAL PANEL BY PCR, STOOL (REPLACES STOOL CULTURE)

## 2024-02-02 NOTE — Significant Event (Signed)
 Received result update that patient tested positive for Norovirus. I have called patient's son and notified him of this result, and precautions to avoid transmission from close contacts. I also contacted [facility] to notify them as well and made a recommendation for Enteric precautions.   Arnulfo Larch, MD  [facility]

## 2024-02-04 ENCOUNTER — Encounter: Payer: Self-pay | Admitting: Internal Medicine

## 2024-02-04 ENCOUNTER — Non-Acute Institutional Stay (SKILLED_NURSING_FACILITY): Payer: Self-pay | Admitting: Internal Medicine

## 2024-02-04 DIAGNOSIS — A084 Viral intestinal infection, unspecified: Secondary | ICD-10-CM | POA: Diagnosis not present

## 2024-02-04 DIAGNOSIS — N1832 Chronic kidney disease, stage 3b: Secondary | ICD-10-CM

## 2024-02-04 NOTE — Progress Notes (Signed)
 Location:  [facility] Room Number: 158 A Place of Service:  SNF [phone]) Provider:  Marguerite Shiley, MD   Marguerite Shiley, MD  Patient Care Team: Marguerite Shiley, MD as PCP - General (Internal Medicine) [place], Peter M, MD as PCP - Cardiology (Cardiology) Tat, Von Grumbling, DO as Attending Physician (Neurology) [place], Peter M, MD as Attending Physician (Cardiology) Marlyne Sing, MD (Hematology and Oncology) Ozell Blunt, MD as Attending Physician (Gastroenterology)  Extended Emergency Contact Information Primary Emergency Contact: Chihuahua,doug Mobile Phone: [phone] Relation: Son Secondary Emergency Contact: [facility] Phone: [phone] Relation: Daughter  Code Status:  DNR Goals of care: Advanced Directive information    01/30/2024    6:20 PM  Advanced Directives  Does Patient Have a Medical Advance Directive? No  Would patient like information on creating a medical advance directive? No - Patient declined     Chief Complaint  Patient presents with   Noro Virus    HPI:  Pt is a 89 y.o. male seen today for an acute visit for  Readmit in Rehab  He lives in [place] with his wife  He was send to ED with two days of Nausea vomiting diarrhea decreased p.o. intake and low-grade fever with tachycardia He was kept in the hospital for 2 days His CT scan was negative Stool was negative for C. difficile he was initially started on Rocephin  which was discontinued later His stool came back positive for norovirus  Was admitted in rehab.  Per nurses he is back to his baseline not having any diarrhea or nausea or vomiting Patient's wife is now diagnosed with norovirus and he does not want to go back to his room till she gets better He is eating and walking with is wlker  He has h/o NSTEMI From 04/20/2022   coronary artery disease s/p CABG with 6 vessels in 2018, hypertension, diabetes mellitus, CKD stage IIIb, HLD And diagnosis of  cognitive impairment and depression by Dr. Omar Bibber  Past Medical History:  Diagnosis Date   Cancer (HCC) 09/10/2002   prostate   Chronic kidney disease, stage 3b (HCC)    Coronary artery disease    Depression    Diabetes mellitus type 2 in obese    Dyslipidemia    Esophageal stricture    Monoclonal paraproteinemia 03/03/2013   Palpitations    RBBB (right bundle branch block)    Past Surgical History:  Procedure Laterality Date   ANKLE SURGERY Left    APPENDECTOMY     CARDIAC CATHETERIZATION     Dr. Swaziland   CATARACT EXTRACTION W/ INTRAOCULAR LENS  IMPLANT, BILATERAL     CORONARY ARTERY BYPASS GRAFT  Oct. 2008   x 5,Dr.Gerhardt   CORONARY/GRAFT ACUTE MI REVASCULARIZATION N/A 04/22/2022   Procedure: Coronary/Graft Acute MI Revascularization;  Surgeon: [place]azil[place], Peter M, MD;  Location: [facility] INVASIVE CV LAB;  Service: Cardiovascular;  Laterality: N/A;   HERNIA REPAIR Bilateral    LEFT HEART CATH AND CORONARY ANGIOGRAPHY N/A 04/22/2022   Procedure: LEFT HEART CATH AND CORONARY ANGIOGRAPHY;  Surgeon: Swaziland, Peter M, MD;  Location: [facility] INVASIVE CV LAB;  Service: Cardiovascular;  Laterality: N/A;   PROSTATECTOMY  2004   radical   right shoulder      replacement   vein strippping      Allergies  Allergen Reactions   Other Itching     Mepergan Fortis, (meprozine)  = itching (patient disputes this in 2023)    Outpatient Encounter Medications  as of 02/04/2024  Medication Sig   aspirin  EC 81 MG tablet Take 1 tablet (81 mg total) by mouth daily. Swallow whole.   bismuth subsalicylate (PEPTO BISMOL) 262 MG chewable tablet Chew 524 mg by mouth as needed.   Black Pepper-Turmeric 3-500 MG CAPS Take 1 capsule by mouth every Monday, Wednesday, and Friday.   Cyanocobalamin (VITAMIN B-12 PO) Take 5,000 mcg by mouth at bedtime.   docusate sodium  (COLACE) 100 MG capsule Take 100 mg by mouth 2 (two) times daily.   FARXIGA 5 MG TABS tablet Take 5 mg by mouth daily.   fenofibrate  (TRICOR ) 48 MG tablet  Take 1 tablet (48 mg total) by mouth daily.   ipratropium (ATROVENT ) 0.03 % nasal spray Place 2 sprays into both nostrils 3 (three) times daily. (Patient taking differently: Place 2 sprays into both nostrils 3 (three) times daily as needed for rhinitis.)   loratadine (CLARITIN) 10 MG tablet Take 10 mg by mouth daily.   meclizine  (ANTIVERT ) 12.5 MG tablet Take 1 tablet (12.5 mg total) by mouth 3 (three) times daily as needed for dizziness.   Multiple Vitamins-Minerals (PRESERVISION AREDS 2) CAPS Take 1 capsule by mouth in the morning and at bedtime.   nitroGLYCERIN  (NITROSTAT ) 0.4 MG SL tablet PLACE 1 TABLET (0.4 MG TOTAL) UNDER THE TONGUE EVERY 5 (FIVE) MINUTES AS NEEDED. (Patient taking differently: Place 0.4 mg under the tongue every 5 (five) minutes as needed for chest pain.)   omeprazole (PRILOSEC) 40 MG capsule Take 40 mg by mouth 2 (two) times daily.   ondansetron  (ZOFRAN ) 4 MG tablet Take 4 mg by mouth every 8 (eight) hours as needed for nausea or vomiting.   polyethylene glycol (MIRALAX  / GLYCOLAX ) 17 g packet Take 17 g by mouth daily as needed for mild constipation, moderate constipation or severe constipation.   simvastatin  (ZOCOR ) 20 MG tablet Take 20 mg by mouth at bedtime.     TRADJENTA  5 MG TABS tablet Take 5 mg by mouth daily.   Turmeric (QC TUMERIC COMPLEX PO) Take 3-500 mg by mouth every Monday, Wednesday, and Friday with hemodialysis. (Patient not taking: Reported on 02/04/2024)   No facility-administered encounter medications on file as of 02/04/2024.    Review of Systems  Constitutional:  Negative for activity change, appetite change and unexpected weight change.  HENT: Negative.    Respiratory:  Negative for cough and shortness of breath.   Cardiovascular:  Negative for leg swelling.  Gastrointestinal:  Negative for constipation.  Genitourinary:  Negative for frequency.  Musculoskeletal:  Positive for gait problem. Negative for arthralgias and myalgias.  Skin: Negative.   Negative for rash.  Neurological:  Negative for dizziness and weakness.  Psychiatric/Behavioral:  Positive for confusion. Negative for sleep disturbance.   All other systems reviewed and are negative.   Immunization History  Administered Date(s) Administered   Influenza, Quadrivalent, Recombinant, Inj, Pf 05/06/2018, 06/05/2019, 06/21/2020, 06/05/2021, 05/30/2023   Influenza-Unspecified 06/05/2021, 07/02/2023   Moderna Sars-Covid-2 Vaccination 09/22/2019, 10/20/2019, 07/26/2020, 06/27/2021   PNEUMOCOCCAL CONJUGATE-20 06/29/2014   Pneumococcal Conjugate-13 06/21/2013   Pneumococcal-Unspecified 08/12/2013   Td 07/29/2009, 07/29/2013   Zoster Recombinant(Shingrix) 01/20/2013   Pertinent  Health Maintenance Due  Topic Date Due   INFLUENZA VACCINE  04/10/2024   HEMOGLOBIN A1C  08/03/2024   FOOT EXAM  11/03/2024   OPHTHALMOLOGY EXAM  11/25/2024      04/21/2022    9:00 AM 04/22/2022    7:45 AM 04/22/2022   10:00 PM 04/23/2022   10:00 AM 07/23/2023  2:39 PM  Fall Risk  Falls in the past year?     0  Was there an injury with Fall?     0  Fall Risk Category Calculator     0  (RETIRED) Patient Fall Risk Level High fall risk High fall risk High fall risk High fall risk   Patient at Risk for Falls Due to     Impaired balance/gait;Impaired mobility  Fall risk Follow up     Falls evaluation completed   Functional Status Survey:    Vitals:   02/04/24 1312  BP: 102/63  Pulse: 79  Resp: 18  Temp: 98 F (36.7 C)  SpO2: 96%  Weight: 171 lb 9.6 oz (77.8 kg)  Height: 5\' 7"  (1.702 m)   Body mass index is 26.88 kg/m. Physical Exam Vitals reviewed.  Constitutional:      Appearance: Normal appearance.  HENT:     Head: Normocephalic.     Nose: Nose normal.     Mouth/Throat:     Mouth: Mucous membranes are moist.     Pharynx: Oropharynx is clear.  Eyes:     Pupils: Pupils are equal, round, and reactive to light.  Cardiovascular:     Rate and Rhythm: Normal rate and regular  rhythm.     Pulses: Normal pulses.     Heart sounds: No murmur heard. Pulmonary:     Effort: Pulmonary effort is normal. No respiratory distress.     Breath sounds: Normal breath sounds. No rales.  Abdominal:     General: Abdomen is flat. Bowel sounds are normal.     Palpations: Abdomen is soft.  Musculoskeletal:        General: No swelling.     Cervical back: Neck supple.  Skin:    General: Skin is warm.  Neurological:     General: No focal deficit present.     Mental Status: He is alert and oriented to person, place, and time.  Psychiatric:        Mood and Affect: Mood normal.        Thought Content: Thought content normal.     Labs reviewed: Recent Labs    12/18/23 1532 01/30/24 1920 02/01/24 0501  NA 135 136 134*  K 4.2 4.0 3.9  CL 101 105 106  CO2 27 22 21*  GLUCOSE 149* 193* 118*  BUN 30* 30* 21  CREATININE 1.53* 1.58* 1.31*  CALCIUM  9.0 8.1* 7.4*   Recent Labs    11/04/23 0000 12/18/23 1532 01/30/24 1920  AST 14 11* 24  ALT 10 7 13   ALKPHOS 46 46 37*  BILITOT  --  0.4 0.4  PROT  --  5.9* 5.7*  ALBUMIN 3.4* 3.6 2.9*   Recent Labs    12/18/23 1532 01/30/24 1920 02/01/24 0501  WBC 8.6 8.6 7.9  NEUTROABS 6.3 8.0* 6.5  HGB 11.3* 12.1* 10.8*  HCT 34.3* 37.3* 33.4*  MCV 91.5 94.0 93.0  PLT 276 230 199   Lab Results  Component Value Date   TSH 3.233 04/21/2022   Lab Results  Component Value Date   HGBA1C 6.9 (H) 02/01/2024   Lab Results  Component Value Date   CHOL 96 11/04/2023   HDL 44 11/04/2023   LDLCALC 42 11/04/2023   TRIG 52 11/04/2023   CHOLHDL 2.8 04/21/2022    Significant Diagnostic Results in last 30 days:  CT ABDOMEN PELVIS W CONTRAST Result Date: 01/31/2024 CLINICAL DATA:  Sepsis. Gastroenteritis. Evaluate for intra-abdominal source of infection. EXAM:  CT ABDOMEN AND PELVIS WITH CONTRAST TECHNIQUE: Multidetector CT imaging of the abdomen and pelvis was performed using the standard protocol following bolus administration of  intravenous contrast. RADIATION DOSE REDUCTION: This exam was performed according to the departmental dose-optimization program which includes automated exposure control, adjustment of the mA and/or kV according to patient size and/or use of iterative reconstruction technique. CONTRAST:  75mL OMNIPAQUE  IOHEXOL  350 MG/ML SOLN COMPARISON:  No comparison studies available. FINDINGS: Lower chest: Calcified granuloma noted right lower lobe. Dependent atelectasis noted in the lung bases. Hepatobiliary: No suspicious focal abnormality within the liver parenchyma. There is no evidence for gallstones, gallbladder wall thickening, or pericholecystic fluid. No intrahepatic or extrahepatic biliary dilation. Pancreas: Pancreatic duct in the body and tail of the pancreas is dilated with atrophy of the overlying pancreatic parenchyma. There is abrupt ductal cut off at the level of the pancreatic neck (axial 24/4 and coronal 62/7. Dystrophic calcification is seen in the pancreatic tail. No obstructing mass lesion is evident by CT and given the findings suggesting a history of chronic pancreatitis, the ductal dilatation may be secondary to chronic inflammation. Occult neoplasm is not excluded. Spleen: Heterogeneous parenchyma with innumerable tiny hypoattenuating lesions in scattered calcified granulomata. Adrenals/Urinary Tract: No adrenal nodule or mass. Cortical thinning noted in both kidneys without hydronephrosis or suspicious renal mass. No evidence for hydroureter. The urinary bladder appears normal for the degree of distention. Stomach/Bowel: Stomach is decompressed which accentuates wall thickness. Duodenum is normally positioned as is the ligament of Treitz. No small bowel wall thickening. No small bowel dilatation. The terminal ileum is normal. Nonvisualization of the appendix is consistent with the reported history of appendectomy. No gross colonic mass. No colonic wall thickening. 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 No retroperitoneal or mesenteric lymphadenopathy. No pelvic sidewall lymphadenopathy. Reproductive: Prostatectomy. Other: No intraperitoneal free fluid. Musculoskeletal: Right groin hernia contains only fat. Surgical clips are seen in the groin regions bilaterally, right greater than left. No worrisome lytic or sclerotic osseous abnormality. Advanced degenerative disc disease noted in the lumbar spine. IMPRESSION: 1. No acute findings in the abdomen or pelvis. Specifically, no findings to explain the patient's history of sepsis. 2. Pancreatic duct in the body and tail of the pancreas is dilated with atrophy of the overlying pancreatic parenchyma. There is abrupt ductal cut off at the level of the pancreatic neck. No obstructing mass lesion is evident by CT and given the findings suggesting a history of chronic pancreatitis, the ductal dilatation may be secondary to chronic inflammation. Occult neoplasm is not excluded. Close follow-up warranted. Given the lack of mass effect in this region the pancreas, MRI of the abdomen with and without contrast may be of minimal benefit. 3. Heterogeneous parenchyma of the spleen with innumerable tiny hypoattenuating lesions., nonspecific. 4. 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 Recommend follow-up CT or MR as appropriate in 12 months and referral to or continued care with vascular specialist. (Ref.: J Vasc Surg. 2018; 67:2-77 and J Am Coll Radiol 2013;10(10):789-794.) 5. Right groin hernia contains only fat. Electronically Signed   By: Donnal Fusi M.D.   On: 01/31/2024 06:15   DG Chest Port 1 View Result Date: 01/30/2024 CLINICAL DATA:  Fevers EXAM: PORTABLE CHEST 1 VIEW COMPARISON:  04/20/2022 FINDINGS: Check  shadow is stable. Postsurgical changes  are noted. Aortic calcifications are seen. Lungs are well aerated bilaterally. No focal infiltrate or effusion is seen. No bony abnormality is noted. IMPRESSION: No active disease. Electronically Signed   By: Violeta Grey M.D.   On: 01/30/2024 21:46   LONG TERM MONITOR (3-14 DAYS) Result Date: 01/15/2024   Normal sinus rhythm with first degree AV block. lowest HR at 4 am   One 6 beat run NSVT   Otherwise rare PAC and PVC   No significant tachy or bradyarrhythmia or AV block. no Afib Patch Wear Time:  13 days and 23 hours (2025-04-13T09:09:21-0400 to 2025-04-27T09:09:13-0400) Patient had a min HR of 43 bpm, max HR of 127 bpm, and avg HR of 75 bpm. Predominant underlying rhythm was Sinus Rhythm. First Degree AV Block was present. Bundle Branch Block/IVCD was present. 1 Ventricular Tachycardia runs occurred, the run with the fastest interval lasting 6 beats with a max rate of 124 bpm, the longest lasting 6 beats with an avg rate of 116 bpm. Isolated SVEs were rare (<1.0%), SVE Couplets were rare (<1.0%), and no SVE Triplets were present. Isolated VEs were rare (<1.0%), VE Couplets were rare (<1.0%), and no VE Triplets were present. Ventricular Bigeminy was present.    Assessment/Plan 1. Viral gastroenteritis (Primary) Nicholes Barks virus was positive His Symptoms are resolved Will GO back to his room  2. Stage 3b chronic kidney disease (HCC) Creat back to his baseline   Family/ staff Communication:   Labs/tests ordered:

## 2024-02-05 LAB — CULTURE, BLOOD (ROUTINE X 2)
Culture: NO GROWTH
Culture: NO GROWTH
Special Requests: ADEQUATE
Special Requests: ADEQUATE

## 2024-02-06 DIAGNOSIS — R531 Weakness: Secondary | ICD-10-CM | POA: Diagnosis not present

## 2024-02-06 LAB — CBC: RBC: 3.87 (ref 3.87–5.11)

## 2024-02-06 LAB — CBC AND DIFFERENTIAL
HCT: 35 — AB (ref 41–53)
Hemoglobin: 11.9 — AB (ref 13.5–17.5)
Platelets: 320 K/uL (ref 150–400)
WBC: 7.4

## 2024-02-13 DIAGNOSIS — M6281 Muscle weakness (generalized): Secondary | ICD-10-CM | POA: Diagnosis not present

## 2024-02-13 DIAGNOSIS — R262 Difficulty in walking, not elsewhere classified: Secondary | ICD-10-CM | POA: Diagnosis not present

## 2024-02-13 DIAGNOSIS — R2681 Unsteadiness on feet: Secondary | ICD-10-CM | POA: Diagnosis not present

## 2024-02-13 DIAGNOSIS — R42 Dizziness and giddiness: Secondary | ICD-10-CM | POA: Diagnosis not present

## 2024-02-18 ENCOUNTER — Encounter: Payer: Self-pay | Admitting: Internal Medicine

## 2024-02-18 ENCOUNTER — Ambulatory Visit: Admitting: Internal Medicine

## 2024-02-18 VITALS — BP 120/78 | HR 87 | Temp 98.4°F | Ht 67.0 in | Wt 172.0 lb

## 2024-02-18 DIAGNOSIS — G3184 Mild cognitive impairment, so stated: Secondary | ICD-10-CM

## 2024-02-18 DIAGNOSIS — F32A Depression, unspecified: Secondary | ICD-10-CM

## 2024-02-18 DIAGNOSIS — E1122 Type 2 diabetes mellitus with diabetic chronic kidney disease: Secondary | ICD-10-CM | POA: Diagnosis not present

## 2024-02-18 DIAGNOSIS — N1832 Chronic kidney disease, stage 3b: Secondary | ICD-10-CM | POA: Diagnosis not present

## 2024-02-18 DIAGNOSIS — A084 Viral intestinal infection, unspecified: Secondary | ICD-10-CM | POA: Diagnosis not present

## 2024-02-18 DIAGNOSIS — E782 Mixed hyperlipidemia: Secondary | ICD-10-CM

## 2024-02-18 NOTE — Progress Notes (Signed)
 Location:  [facility]   Place of Service:   Clinic  Provider:   Code Status: DNR Goals of Care:     01/30/2024    6:20 PM  Advanced Directives  Does Patient Have a Medical Advance Directive? No  Would patient like information on creating a medical advance directive? No - Patient declined     Chief Complaint  Patient presents with   Hospitalization Follow-up    HPI: Patient is a 89 y.o. male seen today for an acute visit for follow up from Rehab  Lives in [place] in [place] with his wife   He has h/o NSTEMI From 04/20/2022   coronary artery disease s/p CABG with 6 vessels in 2018, hypertension, diabetes mellitus, CKD stage IIIb, HLD And diagnosis of cognitive impairment and depression by Dr. Omar Bibber   Patient was admitted in Hospital and then rehab after getting dehydrated due to Noro virus He is doing well with this. Eating better no Nausea or Vomiting  Discussed the use of AI scribe software for clinical note transcription with the patient, who gave verbal consent to proceed.  History of Present Illness     He experiences soreness in his chest muscles following physical therapy sessions,  He had a fall in 4/09 /25  leading to his children's reluctance to take him out due to concerns about handling emergencies. This situation contributes to his feelings of isolation.  He wakes up at 3 AM feeling cold despite using three blankets and needs to consume candy or chocolate to regain energy and return to sleep. He has requested blood sugar checks during these episodes but has received inconsistent responses from the nursing staff.      Other wise continues to do well in AL Able to do his ADLS And uses his power chair Walker inside the room Wears Pull ups  Other issues  He has h/o NSTEMI From 04/20/2022   coronary artery disease s/p CABG with 6 vessels in 2018, hypertension, diabetes mellitus, CKD stage IIIb, HLD And diagnosis of cognitive impairment and depression by Dr.  Omar Bibber  Past Medical History:  Diagnosis Date   Cancer (HCC) 09/10/2002   prostate   Chronic kidney disease, stage 3b (HCC)    Coronary artery disease    Depression    Diabetes mellitus type 2 in obese    Dyslipidemia    Esophageal stricture    Monoclonal paraproteinemia 03/03/2013   Palpitations    RBBB (right bundle branch block)     Past Surgical History:  Procedure Laterality Date   ANKLE SURGERY Left    APPENDECTOMY     CARDIAC CATHETERIZATION     Dr. Swaziland   CATARACT EXTRACTION W/ INTRAOCULAR LENS  IMPLANT, BILATERAL     CORONARY ARTERY BYPASS GRAFT  Oct. 2008   x 5,Dr.Gerhardt   CORONARY/GRAFT ACUTE MI REVASCULARIZATION N/A 04/22/2022   Procedure: Coronary/Graft Acute MI Revascularization;  Surgeon: Swaziland, Peter M, MD;  Location: [facility] INVASIVE CV LAB;  Service: Cardiovascular;  Laterality: N/A;   HERNIA REPAIR Bilateral    LEFT HEART CATH AND CORONARY ANGIOGRAPHY N/A 04/22/2022   Procedure: LEFT HEART CATH AND CORONARY ANGIOGRAPHY;  Surgeon: Swaziland, Peter M, MD;  Location: [facility] INVASIVE CV LAB;  Service: Cardiovascular;  Laterality: N/A;   PROSTATECTOMY  2004   radical   right shoulder      replacement   vein strippping      Allergies  Allergen Reactions   Other Itching     Mepergan Fortis, (meprozine)  =  itching (patient disputes this in 2023)    Outpatient Encounter Medications as of 02/18/2024  Medication Sig   aspirin  EC 81 MG tablet Take 1 tablet (81 mg total) by mouth daily. Swallow whole.   bismuth subsalicylate (PEPTO BISMOL) 262 MG chewable tablet Chew 524 mg by mouth as needed.   Black Pepper-Turmeric 3-500 MG CAPS Take 1 capsule by mouth every Monday, Wednesday, and Friday.   Cyanocobalamin (VITAMIN B-12 PO) Take 5,000 mcg by mouth at bedtime.   docusate sodium  (COLACE) 100 MG capsule Take 100 mg by mouth 2 (two) times daily.   FARXIGA 5 MG TABS tablet Take 5 mg by mouth daily.   fenofibrate  (TRICOR ) 48 MG tablet Take 1 tablet (48 mg total) by mouth  daily.   loratadine (CLARITIN) 10 MG tablet Take 10 mg by mouth daily.   meclizine  (ANTIVERT ) 12.5 MG tablet Take 1 tablet (12.5 mg total) by mouth 3 (three) times daily as needed for dizziness.   Multiple Vitamins-Minerals (PRESERVISION AREDS 2) CAPS Take 1 capsule by mouth in the morning and at bedtime.   nitroGLYCERIN  (NITROSTAT ) 0.4 MG SL tablet PLACE 1 TABLET (0.4 MG TOTAL) UNDER THE TONGUE EVERY 5 (FIVE) MINUTES AS NEEDED. (Patient taking differently: Place 0.4 mg under the tongue every 5 (five) minutes as needed for chest pain.)   omeprazole (PRILOSEC) 40 MG capsule Take 40 mg by mouth 2 (two) times daily.   ondansetron  (ZOFRAN ) 4 MG tablet Take 4 mg by mouth every 8 (eight) hours as needed for nausea or vomiting.   polyethylene glycol (MIRALAX  / GLYCOLAX ) 17 g packet Take 17 g by mouth daily as needed for mild constipation, moderate constipation or severe constipation.   simvastatin  (ZOCOR ) 20 MG tablet Take 20 mg by mouth at bedtime.     TRADJENTA  5 MG TABS tablet Take 5 mg by mouth daily.   Turmeric (QC TUMERIC COMPLEX PO) Take 3-500 mg by mouth every Monday, Wednesday, and Friday with hemodialysis. (Patient not taking: Reported on 02/13/2024)   [DISCONTINUED] ipratropium (ATROVENT ) 0.03 % nasal spray Place 2 sprays into both nostrils 3 (three) times daily. (Patient not taking: Reported on 02/13/2024)   No facility-administered encounter medications on file as of 02/18/2024.    Review of Systems:  Review of Systems  Constitutional:  Negative for activity change, appetite change and unexpected weight change.  HENT: Negative.    Respiratory:  Negative for cough and shortness of breath.   Cardiovascular:  Negative for leg swelling.  Gastrointestinal:  Negative for constipation.  Genitourinary:  Positive for frequency.  Musculoskeletal:  Positive for gait problem. Negative for arthralgias and myalgias.  Skin: Negative.  Negative for rash.  Neurological:  Negative for dizziness and  weakness.  Psychiatric/Behavioral:  Positive for confusion and dysphoric mood. Negative for sleep disturbance.   All other systems reviewed and are negative.   Health Maintenance  Topic Date Due   Zoster Vaccines- Shingrix (2 of 2) 03/17/2013   COVID-19 Vaccine (5 - 2024-25 season) 05/12/2023   DTaP/Tdap/Td (3 - Tdap) 07/30/2023   Medicare Annual Wellness (AWV)  11/22/2023   INFLUENZA VACCINE  04/10/2024   HEMOGLOBIN A1C  08/03/2024   FOOT EXAM  11/03/2024   OPHTHALMOLOGY EXAM  11/25/2024   Pneumonia Vaccine 57+ Years old  Completed   HPV VACCINES  Aged Out   Meningococcal B Vaccine  Aged Out    Physical Exam: Vitals:   02/13/24 0918  BP: 120/78  Pulse: 87  Temp: 98.4 F (36.9 C)  SpO2: 97%  Weight: 172 lb (78 kg)  Height: 5' 7 (1.702 m)   Body mass index is 26.94 kg/m. Physical Exam Vitals reviewed.  Constitutional:      Appearance: Normal appearance.  HENT:     Head: Normocephalic.     Nose: Nose normal.     Mouth/Throat:     Mouth: Mucous membranes are moist.     Pharynx: Oropharynx is clear.  Eyes:     Pupils: Pupils are equal, round, and reactive to light.  Cardiovascular:     Rate and Rhythm: Normal rate and regular rhythm.     Pulses: Normal pulses.     Heart sounds: No murmur heard. Pulmonary:     Effort: Pulmonary effort is normal. No respiratory distress.     Breath sounds: Normal breath sounds. No rales.  Abdominal:     General: Abdomen is flat. Bowel sounds are normal.     Palpations: Abdomen is soft.  Musculoskeletal:        General: No swelling.     Cervical back: Neck supple.  Skin:    General: Skin is warm.  Neurological:     General: No focal deficit present.     Mental Status: He is alert and oriented to person, place, and time.  Psychiatric:        Mood and Affect: Mood normal.        Thought Content: Thought content normal.    Labs reviewed: Basic Metabolic Panel: Recent Labs    12/18/23 1532 01/30/24 1920 02/01/24 0501   NA 135 136 134*  K 4.2 4.0 3.9  CL 101 105 106  CO2 27 22 21*  GLUCOSE 149* 193* 118*  BUN 30* 30* 21  CREATININE 1.53* 1.58* 1.31*  CALCIUM  9.0 8.1* 7.4*   Liver Function Tests: Recent Labs    11/04/23 0000 12/18/23 1532 01/30/24 1920  AST 14 11* 24  ALT 10 7 13   ALKPHOS 46 46 37*  BILITOT  --  0.4 0.4  PROT  --  5.9* 5.7*  ALBUMIN 3.4* 3.6 2.9*   Recent Labs    01/30/24 1920  LIPASE 30   No results for input(s): AMMONIA in the last 8760 hours. CBC: Recent Labs    12/18/23 1532 01/30/24 1920 02/01/24 0501  WBC 8.6 8.6 7.9  NEUTROABS 6.3 8.0* 6.5  HGB 11.3* 12.1* 10.8*  HCT 34.3* 37.3* 33.4*  MCV 91.5 94.0 93.0  PLT 276 230 199   Lipid Panel: Recent Labs    11/04/23 0000  CHOL 96  HDL 44  LDLCALC 42  TRIG 52   Lab Results  Component Value Date   HGBA1C 6.9 (H) 02/01/2024    Procedures since last visit: CT ABDOMEN PELVIS W CONTRAST Result Date: 01/31/2024 CLINICAL DATA:  Sepsis. Gastroenteritis. Evaluate for intra-abdominal source of infection. EXAM: CT ABDOMEN AND PELVIS WITH CONTRAST TECHNIQUE: Multidetector CT imaging of the abdomen and pelvis was performed using the standard protocol following bolus administration of intravenous contrast. RADIATION DOSE REDUCTION: This exam was performed according to the departmental dose-optimization program which includes automated exposure control, adjustment of the mA and/or kV according to patient size and/or use of iterative reconstruction technique. CONTRAST:  75mL OMNIPAQUE  IOHEXOL  350 MG/ML SOLN COMPARISON:  No comparison studies available. FINDINGS: Lower chest: Calcified granuloma noted right lower lobe. Dependent atelectasis noted in the lung bases. Hepatobiliary: No suspicious focal abnormality within the liver parenchyma. There is no evidence for gallstones, gallbladder wall thickening, or pericholecystic fluid. No intrahepatic  or extrahepatic biliary dilation. Pancreas: Pancreatic duct in the body and  tail of the pancreas is dilated with atrophy of the overlying pancreatic parenchyma. There is abrupt ductal cut off at the level of the pancreatic neck (axial 24/4 and coronal 62/7. Dystrophic calcification is seen in the pancreatic tail. No obstructing mass lesion is evident by CT and given the findings suggesting a history of chronic pancreatitis, the ductal dilatation may be secondary to chronic inflammation. Occult neoplasm is not excluded. Spleen: Heterogeneous parenchyma with innumerable tiny hypoattenuating lesions in scattered calcified granulomata. Adrenals/Urinary Tract: No adrenal nodule or mass. Cortical thinning noted in both kidneys without hydronephrosis or suspicious renal mass. No evidence for hydroureter. The urinary bladder appears normal for the degree of distention. Stomach/Bowel: Stomach is decompressed which accentuates wall thickness. Duodenum is normally positioned as is the ligament of Treitz. No small bowel wall thickening. No small bowel dilatation. The terminal ileum is normal. Nonvisualization of the appendix is consistent with the reported history of appendectomy. No gross colonic mass. No colonic wall thickening. Vascular/Lymphatic: Focal infrarenal saccular aneurysmal dilatation of the abdominal aorta noted with maximum orthogonal diameters of 4.2 by 3.5 cm There is no gastrohepatic or hepatoduodenal ligament lymphadenopathy. No retroperitoneal or mesenteric lymphadenopathy. No pelvic sidewall lymphadenopathy. Reproductive: Prostatectomy. Other: No intraperitoneal free fluid. Musculoskeletal: Right groin hernia contains only fat. Surgical clips are seen in the groin regions bilaterally, right greater than left. No worrisome lytic or sclerotic osseous abnormality. Advanced degenerative disc disease noted in the lumbar spine. IMPRESSION: 1. No acute findings in the abdomen or pelvis. Specifically, no findings to explain the patient's history of sepsis. 2. Pancreatic duct in the body  and tail of the pancreas is dilated with atrophy of the overlying pancreatic parenchyma. There is abrupt ductal cut off at the level of the pancreatic neck. No obstructing mass lesion is evident by CT and given the findings suggesting a history of chronic pancreatitis, the ductal dilatation may be secondary to chronic inflammation. Occult neoplasm is not excluded. Close follow-up warranted. Given the lack of mass effect in this region the pancreas, MRI of the abdomen with and without contrast may be of minimal benefit. 3. Heterogeneous parenchyma of the spleen with innumerable tiny hypoattenuating lesions., nonspecific. 4. 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 Recommend follow-up CT or MR as appropriate in 12 months and referral to or continued care with vascular specialist. (Ref.: J Vasc Surg. 2018; 67:2-77 and J Am Coll Radiol 2013;10(10):789-794.) 5. Right groin hernia contains only fat. Electronically Signed   By: Donnal Fusi M.D.   On: 01/31/2024 06:15   DG Chest Port 1 View Result Date: 01/30/2024 CLINICAL DATA:  Fevers EXAM: PORTABLE CHEST 1 VIEW COMPARISON:  04/20/2022 FINDINGS: Check shadow is stable. Postsurgical changes are noted. Aortic calcifications are seen. Lungs are well aerated bilaterally. No focal infiltrate or effusion is seen. No bony abnormality is noted. IMPRESSION: No active disease. Electronically Signed   By: Violeta Grey M.D.   On: 01/30/2024 21:46    Assessment/Plan 1. Acute depression (Primary) Patient is very depressed Keeps talking about not living anymore as he is stuck in [place]  Will start him on Zoloft 25 mg every day He did have some mild Hyponatremia but it was the time he was  Sick with Nausea vomiting BMP in 1 week 2. MCI (mild cognitive impairment) MMSE 24/30  3. Viral gastroenteritis Symptoms Resolved  4. Stage 3b chronic  kidney disease (HCC) Creat is stable Repeat BMP next  week  5. Type 2 diabetes mellitus with stage 3b chronic kidney disease, without long-term current use of insulin  (HCC) Last A1c 6.9 in 5/25 He is having these Episodes at night of feeling sweaty Will check his CBGS His Fasting CBGS have been in [id number]  6. Mixed hyperlipidemia On statin    Labs/tests ordered:  * No order type specified * Next appt:  04/20/2024

## 2024-02-24 ENCOUNTER — Non-Acute Institutional Stay: Payer: Self-pay | Admitting: Internal Medicine

## 2024-02-24 ENCOUNTER — Encounter: Payer: Self-pay | Admitting: Internal Medicine

## 2024-02-24 DIAGNOSIS — R531 Weakness: Secondary | ICD-10-CM | POA: Diagnosis not present

## 2024-02-24 DIAGNOSIS — F32A Depression, unspecified: Secondary | ICD-10-CM | POA: Diagnosis not present

## 2024-02-24 NOTE — Progress Notes (Signed)
 Location: Medical illustrator of Service:  [facility] (13)  Provider:   Code Status: DNR Goals of Care:     01/30/2024    6:20 PM  Advanced Directives  Does Patient Have a Medical Advance Directive? No  Would patient like information on creating a medical advance directive? No - Patient declined     Chief Complaint  Patient presents with   Acute Visit    HPI: Patient is a 89 y.o. male seen today for an acute visit for Weakness and worsening Confusion  Lives in [place] in [place] with his wife   He has h/o NSTEMI From 04/20/2022   coronary artery disease s/p CABG with 6 vessels in 2018, hypertension, diabetes mellitus, CKD stage IIIb, HLD And diagnosis of cognitive impairment and depression by Dr. Omar Bibber  I saw patient last week in the clinic and started him on Zoloft 25 mg for depression.  Patient over the weekend family took him out for Father's Day and they felt the patient was more weak.  Patient says he feels like he wants to sleep all the time feels like he cannot get up feels weak.  The son and the wife also noticed patient has been more confused.  Patient does not have any cough fever chest pain no dysuria no diarrhea no nausea no vomiting  Past Medical History:  Diagnosis Date   Cancer (HCC) 09/10/2002   prostate   Chronic kidney disease, stage 3b (HCC)    Coronary artery disease    Depression    Diabetes mellitus type 2 in obese    Dyslipidemia    Esophageal stricture    Monoclonal paraproteinemia 03/03/2013   Palpitations    RBBB (right bundle branch block)     Past Surgical History:  Procedure Laterality Date   ANKLE SURGERY Left    APPENDECTOMY     CARDIAC CATHETERIZATION     Dr. Swaziland   CATARACT EXTRACTION W/ INTRAOCULAR LENS  IMPLANT, BILATERAL     CORONARY ARTERY BYPASS GRAFT  Oct. 2008   x 5,Dr.Gerhardt   CORONARY/GRAFT ACUTE MI REVASCULARIZATION N/A 04/22/2022   Procedure: Coronary/Graft Acute MI Revascularization;  Surgeon: Swaziland,  Peter M, MD;  Location: [facility] INVASIVE CV LAB;  Service: Cardiovascular;  Laterality: N/A;   HERNIA REPAIR Bilateral    LEFT HEART CATH AND CORONARY ANGIOGRAPHY N/A 04/22/2022   Procedure: LEFT HEART CATH AND CORONARY ANGIOGRAPHY;  Surgeon: Swaziland, Peter M, MD;  Location: [facility] INVASIVE CV LAB;  Service: Cardiovascular;  Laterality: N/A;   PROSTATECTOMY  2004   radical   right shoulder      replacement   vein strippping      Allergies  Allergen Reactions   Other Itching     Mepergan Fortis, (meprozine)  = itching (patient disputes this in 2023)    Outpatient Encounter Medications as of 02/24/2024  Medication Sig   Turmeric (QC TUMERIC COMPLEX PO) Take 3-500 mg by mouth every Monday, Wednesday, and Friday with hemodialysis.   aspirin  EC 81 MG tablet Take 1 tablet (81 mg total) by mouth daily. Swallow whole.   bismuth subsalicylate (PEPTO BISMOL) 262 MG chewable tablet Chew 524 mg by mouth as needed.   Black Pepper-Turmeric 3-500 MG CAPS Take 1 capsule by mouth every Monday, Wednesday, and Friday.   Cyanocobalamin (VITAMIN B-12 PO) Take 5,000 mcg by mouth at bedtime.   docusate sodium  (COLACE) 100 MG capsule Take 100 mg by mouth 2 (two) times daily.   FARXIGA 5  MG TABS tablet Take 5 mg by mouth daily.   fenofibrate  (TRICOR ) 48 MG tablet Take 1 tablet (48 mg total) by mouth daily.   loratadine (CLARITIN) 10 MG tablet Take 10 mg by mouth daily.   meclizine  (ANTIVERT ) 12.5 MG tablet Take 1 tablet (12.5 mg total) by mouth 3 (three) times daily as needed for dizziness.   Multiple Vitamins-Minerals (PRESERVISION AREDS 2) CAPS Take 1 capsule by mouth in the morning and at bedtime.   nitroGLYCERIN  (NITROSTAT ) 0.4 MG SL tablet PLACE 1 TABLET (0.4 MG TOTAL) UNDER THE TONGUE EVERY 5 (FIVE) MINUTES AS NEEDED. (Patient taking differently: Place 0.4 mg under the tongue every 5 (five) minutes as needed for chest pain.)   omeprazole (PRILOSEC) 40 MG capsule Take 40 mg by mouth 2 (two) times daily.    ondansetron  (ZOFRAN ) 4 MG tablet Take 4 mg by mouth every 8 (eight) hours as needed for nausea or vomiting.   polyethylene glycol (MIRALAX  / GLYCOLAX ) 17 g packet Take 17 g by mouth daily as needed for mild constipation, moderate constipation or severe constipation.   simvastatin  (ZOCOR ) 20 MG tablet Take 20 mg by mouth at bedtime.     TRADJENTA  5 MG TABS tablet Take 5 mg by mouth daily.   No facility-administered encounter medications on file as of 02/24/2024.    Review of Systems:  Review of Systems  Constitutional:  Positive for activity change. Negative for appetite change and unexpected weight change.  HENT: Negative.    Respiratory:  Negative for cough and shortness of breath.   Cardiovascular:  Negative for leg swelling.  Gastrointestinal:  Negative for constipation.  Genitourinary:  Negative for frequency.  Musculoskeletal:  Positive for gait problem. Negative for arthralgias and myalgias.  Skin: Negative.  Negative for rash.  Neurological:  Positive for weakness. Negative for dizziness.  Psychiatric/Behavioral:  Positive for confusion. Negative for sleep disturbance.   All other systems reviewed and are negative.   Health Maintenance  Topic Date Due   Zoster Vaccines- Shingrix (2 of 2) 03/17/2013   COVID-19 Vaccine (5 - 2024-25 season) 05/12/2023   DTaP/Tdap/Td (3 - Tdap) 07/30/2023   Medicare Annual Wellness (AWV)  11/22/2023   INFLUENZA VACCINE  04/10/2024   HEMOGLOBIN A1C  08/03/2024   FOOT EXAM  11/03/2024   OPHTHALMOLOGY EXAM  11/25/2024   Pneumococcal Vaccine: 50+ Years  Completed   HPV VACCINES  Aged Out   Meningococcal B Vaccine  Aged Out    Physical Exam: Vitals:   02/24/24 2152  BP: (!) 150/77  Pulse: 81  Resp: 17  Temp: 97.7 F (36.5 C)   There is no height or weight on file to calculate BMI. Physical Exam Vitals reviewed.  Constitutional:      Appearance: Normal appearance.  HENT:     Head: Normocephalic.     Nose: Nose normal.      Mouth/Throat:     Mouth: Mucous membranes are moist.     Pharynx: Oropharynx is clear.   Eyes:     Pupils: Pupils are equal, round, and reactive to light.    Cardiovascular:     Rate and Rhythm: Normal rate and regular rhythm.     Pulses: Normal pulses.     Heart sounds: No murmur heard. Pulmonary:     Effort: Pulmonary effort is normal. No respiratory distress.     Breath sounds: Normal breath sounds. No rales.  Abdominal:     General: Abdomen is flat. Bowel sounds are normal.  Palpations: Abdomen is soft.   Musculoskeletal:        General: Swelling present.     Cervical back: Neck supple.   Skin:    General: Skin is warm.   Neurological:     General: No focal deficit present.     Mental Status: He is alert.     Comments: Was able to stand up with his walker but was weak   Psychiatric:        Mood and Affect: Mood normal.        Thought Content: Thought content normal.     Labs reviewed: Basic Metabolic Panel: Recent Labs    12/18/23 1532 01/30/24 1920 02/01/24 0501  NA 135 136 134*  K 4.2 4.0 3.9  CL 101 105 106  CO2 27 22 21*  GLUCOSE 149* 193* 118*  BUN 30* 30* 21  CREATININE 1.53* 1.58* 1.31*  CALCIUM  9.0 8.1* 7.4*   Liver Function Tests: Recent Labs    11/04/23 0000 12/18/23 1532 01/30/24 1920  AST 14 11* 24  ALT 10 7 13   ALKPHOS 46 46 37*  BILITOT  --  0.4 0.4  PROT  --  5.9* 5.7*  ALBUMIN 3.4* 3.6 2.9*   Recent Labs    01/30/24 1920  LIPASE 30   No results for input(s): AMMONIA in the last 8760 hours. CBC: Recent Labs    12/18/23 1532 01/30/24 1920 02/01/24 0501  WBC 8.6 8.6 7.9  NEUTROABS 6.3 8.0* 6.5  HGB 11.3* 12.1* 10.8*  HCT 34.3* 37.3* 33.4*  MCV 91.5 94.0 93.0  PLT 276 230 199   Lipid Panel: Recent Labs    11/04/23 0000  CHOL 96  HDL 44  LDLCALC 42  TRIG 52   Lab Results  Component Value Date   HGBA1C 6.9 (H) 02/01/2024    Procedures since last visit: CT ABDOMEN PELVIS W CONTRAST Result Date:  01/31/2024 CLINICAL DATA:  Sepsis. Gastroenteritis. Evaluate for intra-abdominal source of infection. EXAM: CT ABDOMEN AND PELVIS WITH CONTRAST TECHNIQUE: Multidetector CT imaging of the abdomen and pelvis was performed using the standard protocol following bolus administration of intravenous contrast. RADIATION DOSE REDUCTION: This exam was performed according to the departmental dose-optimization program which includes automated exposure control, adjustment of the mA and/or kV according to patient size and/or use of iterative reconstruction technique. CONTRAST:  75mL OMNIPAQUE  IOHEXOL  350 MG/ML SOLN COMPARISON:  No comparison studies available. FINDINGS: Lower chest: Calcified granuloma noted right lower lobe. Dependent atelectasis noted in the lung bases. Hepatobiliary: No suspicious focal abnormality within the liver parenchyma. There is no evidence for gallstones, gallbladder wall thickening, or pericholecystic fluid. No intrahepatic or extrahepatic biliary dilation. Pancreas: Pancreatic duct in the body and tail of the pancreas is dilated with atrophy of the overlying pancreatic parenchyma. There is abrupt ductal cut off at the level of the pancreatic neck (axial 24/4 and coronal 62/7. Dystrophic calcification is seen in the pancreatic tail. No obstructing mass lesion is evident by CT and given the findings suggesting a history of chronic pancreatitis, the ductal dilatation may be secondary to chronic inflammation. Occult neoplasm is not excluded. Spleen: Heterogeneous parenchyma with innumerable tiny hypoattenuating lesions in scattered calcified granulomata. Adrenals/Urinary Tract: No adrenal nodule or mass. Cortical thinning noted in both kidneys without hydronephrosis or suspicious renal mass. No evidence for hydroureter. The urinary bladder appears normal for the degree of distention. Stomach/Bowel: Stomach is decompressed which accentuates wall thickness. Duodenum is normally positioned as is the  ligament of Treitz.  No small bowel wall thickening. No small bowel dilatation. The terminal ileum is normal. Nonvisualization of the appendix is consistent with the reported history of appendectomy. No gross colonic mass. No colonic wall thickening. Vascular/Lymphatic: Focal infrarenal saccular aneurysmal dilatation of the abdominal aorta noted with maximum orthogonal diameters of 4.2 by 3.5 cm There is no gastrohepatic or hepatoduodenal ligament lymphadenopathy. No retroperitoneal or mesenteric lymphadenopathy. No pelvic sidewall lymphadenopathy. Reproductive: Prostatectomy. Other: No intraperitoneal free fluid. Musculoskeletal: Right groin hernia contains only fat. Surgical clips are seen in the groin regions bilaterally, right greater than left. No worrisome lytic or sclerotic osseous abnormality. Advanced degenerative disc disease noted in the lumbar spine. IMPRESSION: 1. No acute findings in the abdomen or pelvis. Specifically, no findings to explain the patient's history of sepsis. 2. Pancreatic duct in the body and tail of the pancreas is dilated with atrophy of the overlying pancreatic parenchyma. There is abrupt ductal cut off at the level of the pancreatic neck. No obstructing mass lesion is evident by CT and given the findings suggesting a history of chronic pancreatitis, the ductal dilatation may be secondary to chronic inflammation. Occult neoplasm is not excluded. Close follow-up warranted. Given the lack of mass effect in this region the pancreas, MRI of the abdomen with and without contrast may be of minimal benefit. 3. Heterogeneous parenchyma of the spleen with innumerable tiny hypoattenuating lesions., nonspecific. 4. 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 Recommend follow-up CT or MR as appropriate in 12 months and referral to or continued care with vascular specialist. (Ref.: J Vasc Surg. 2018; 67:2-77 and J Am  Coll Radiol 2013;10(10):789-794.) 5. Right groin hernia contains only fat. Electronically Signed   By: Donnal Fusi M.D.   On: 01/31/2024 06:15   DG Chest Port 1 View Result Date: 01/30/2024 CLINICAL DATA:  Fevers EXAM: PORTABLE CHEST 1 VIEW COMPARISON:  04/20/2022 FINDINGS: Check shadow is stable. Postsurgical changes are noted. Aortic calcifications are seen. Lungs are well aerated bilaterally. No focal infiltrate or effusion is seen. No bony abnormality is noted. IMPRESSION: No active disease. Electronically Signed   By: Violeta Grey M.D.   On: 01/30/2024 21:46    Assessment/Plan 1. Weakness (Primary) ? After starting Zoloft Discontinue Zoloft Check CMP,CBC  2. Acute depression Discontinue Zoloft Consider Wellbutrin later     Labs/tests ordered:  * No order type specified * Next appt:  04/20/2024

## 2024-02-25 DIAGNOSIS — E1129 Type 2 diabetes mellitus with other diabetic kidney complication: Secondary | ICD-10-CM | POA: Diagnosis not present

## 2024-02-25 LAB — COMPREHENSIVE METABOLIC PANEL WITH GFR
Albumin: 3.8 (ref 3.5–5.0)
Calcium: 9.3 (ref 8.7–10.7)
Globulin: 2.7
eGFR: 48

## 2024-02-25 LAB — HEPATIC FUNCTION PANEL
ALT: 14 U/L (ref 10–40)
AST: 24 (ref 14–40)
Alkaline Phosphatase: 66 (ref 25–125)
Bilirubin, Total: 0.3

## 2024-02-25 LAB — CBC AND DIFFERENTIAL
HCT: 37 — AB (ref 41–53)
Hemoglobin: 12.3 — AB (ref 13.5–17.5)
Platelets: 238 10*3/uL (ref 150–400)
WBC: 4

## 2024-02-25 LAB — BASIC METABOLIC PANEL WITH GFR
BUN: 20 (ref 4–21)
CO2: 20 (ref 13–22)
Chloride: 97 — AB (ref 99–108)
Creatinine: 1.4 — AB (ref 0.6–1.3)
Glucose: 157
Potassium: 4.1 meq/L (ref 3.5–5.1)
Sodium: 132 — AB (ref 137–147)

## 2024-02-25 LAB — CBC: RBC: 4.07 (ref 3.87–5.11)

## 2024-02-28 NOTE — Progress Notes (Unsigned)
 Tony Lee Date of Birth: 07/07/1931   History of Present Illness: Tony Lee is seen today for followup of CAD. He has a history of coronary disease and is status post coronary bypass surgery in October of 2008. Myoview  study in December 2014 showed a slight scar at the base of the inferolateral wall. No ischemia. EF 62%. He has DM, dyslipidemia, and chronic RBBB.   He was admitted in August 2023 with NSTEMI. Troponin peak 114. Underwent cardiac cath showing patent grafts but culprit lesion in the PL distal to the graft. This was successfully stented. Echo showed normal LV function. Was DC on ASA and Brilinta .  He is in assisted living at [facility]. Has a standing DNR order.   He was recently admitted in May with generalized weakness, nausea and diarrhea. Work up essentially negative and symptoms improved. Recent outpatient event monitor was benign.   Current Outpatient Medications on File Prior to Visit  Medication Sig Dispense Refill   aspirin  EC 81 MG tablet Take 1 tablet (81 mg total) by mouth daily. Swallow whole. 30 tablet 12   bismuth subsalicylate (PEPTO BISMOL) 262 MG chewable tablet Chew 524 mg by mouth as needed.     Black Pepper-Turmeric 3-500 MG CAPS Take 1 capsule by mouth every Monday, Wednesday, and Friday.     Cyanocobalamin (VITAMIN B-12 PO) Take 5,000 mcg by mouth at bedtime.     docusate sodium  (COLACE) 100 MG capsule Take 100 mg by mouth 2 (two) times daily.     FARXIGA 5 MG TABS tablet Take 5 mg by mouth daily.     fenofibrate  (TRICOR ) 48 MG tablet Take 1 tablet (48 mg total) by mouth daily. 90 tablet 3   loratadine (CLARITIN) 10 MG tablet Take 10 mg by mouth daily.     meclizine  (ANTIVERT ) 12.5 MG tablet Take 1 tablet (12.5 mg total) by mouth 3 (three) times daily as needed for dizziness.     Multiple Vitamins-Minerals (PRESERVISION AREDS 2) CAPS Take 1 capsule by mouth in the morning and at bedtime.     nitroGLYCERIN  (NITROSTAT ) 0.4 MG SL tablet PLACE 1  TABLET (0.4 MG TOTAL) UNDER THE TONGUE EVERY 5 (FIVE) MINUTES AS NEEDED. (Patient taking differently: Place 0.4 mg under the tongue every 5 (five) minutes as needed for chest pain.) 25 tablet 6   omeprazole (PRILOSEC) 40 MG capsule Take 40 mg by mouth 2 (two) times daily.     ondansetron  (ZOFRAN ) 4 MG tablet Take 4 mg by mouth every 8 (eight) hours as needed for nausea or vomiting.     polyethylene glycol (MIRALAX  / GLYCOLAX ) 17 g packet Take 17 g by mouth daily as needed for mild constipation, moderate constipation or severe constipation.     simvastatin  (ZOCOR ) 20 MG tablet Take 20 mg by mouth at bedtime.       TRADJENTA  5 MG TABS tablet Take 5 mg by mouth daily.  6   Turmeric (QC TUMERIC COMPLEX PO) Take 3-500 mg by mouth every Monday, Wednesday, and Friday with hemodialysis.     No current facility-administered medications on file prior to visit.    Allergies  Allergen Reactions   Other Itching     Mepergan Fortis, (meprozine)  = itching (patient disputes this in 2023)    Past Medical History:  Diagnosis Date   Cancer (HCC) 09/10/2002   prostate   Chronic kidney disease, stage 3b (HCC)    Coronary artery disease    Depression  Diabetes mellitus type 2 in obese    Dyslipidemia    Esophageal stricture    Monoclonal paraproteinemia 03/03/2013   Palpitations    RBBB (right bundle branch block)     Past Surgical History:  Procedure Laterality Date   ANKLE SURGERY Left    APPENDECTOMY     CARDIAC CATHETERIZATION     Dr. Swaziland   CATARACT EXTRACTION W/ INTRAOCULAR LENS  IMPLANT, BILATERAL     CORONARY ARTERY BYPASS GRAFT  Oct. 2008   x 5,Tony Lee   CORONARY/GRAFT ACUTE MI REVASCULARIZATION N/A 04/22/2022   Procedure: Coronary/Graft Acute MI Revascularization;  Surgeon: Tony Lee, Tony Lee;  Location: [facility] INVASIVE CV LAB;  Service: Cardiovascular;  Laterality: N/A;   HERNIA REPAIR Bilateral    LEFT HEART CATH AND CORONARY ANGIOGRAPHY N/A 04/22/2022   Procedure: LEFT HEART  CATH AND CORONARY ANGIOGRAPHY;  Surgeon: Tony Lee, Tony Lee;  Location: [facility] INVASIVE CV LAB;  Service: Cardiovascular;  Laterality: N/A;   PROSTATECTOMY  2004   radical   right shoulder      replacement   vein strippping      Social History   Tobacco Use  Smoking Status Former   Current packs/day: 0.00   Average packs/day: 0.5 packs/day for 20.0 years (10.0 ttl pk-yrs)   Types: Cigarettes   Start date: 09/11/1947   Quit date: 09/11/1967   Years since quitting: 56.5  Smokeless Tobacco Never  Tobacco Comments   quit 45 years ago    Social History   Substance and Sexual Activity  Alcohol  Use Yes   Comment: social, 1-2 times per week     Family History  Problem Relation Age of Onset   Heart failure Father        age 73   Heart attack Brother        age 37 post third open heart surgery   Heart attack Brother     Review of Systems: As noted in history of present illness..  All other systems were reviewed and are negative.  Physical Exam: There were no vitals taken for this visit. He is an obese white male in no acute distress. The HEENT exam is normal. The carotids are 2+ without bruits.  There is no thyromegaly.  There is no JVD.  The lungs are clear.   He has a median sternotomy scar. The heart exam reveals a regular rate with a normal S1 and S2.  There are no murmurs, gallops, or rubs.  The PMI is not displaced.   Abdominal exam reveals good bowel sounds.   There is no hepatosplenomegaly or tenderness.  There are no masses.  Exam of the legs reveal no clubbing, cyanosis. Tr RLE edema.   All pulses are good.  Cranial nerves II - XII are intact.   LABORATORY DATA:      CXR 04/27/22: NAD  Labs dated 04/13/19: A1c 6.5%. cholesterol 123, triglycerides 88, HDL 36, LDL 69. Creatinine 1.7. otherwise CMET and CBC normal Dated 05/11/22: A1c 7.3%. cholesterol 105, triglycerides 85, HDL 37, LDL 51,  Dated 11/15/22: cholesterol 114, triglycerides 61, HDL 43, LDL 59. A1c 7.1%. CBC, CMET and  TSH normal.  Dated 05/30/23: A1c 7.6%.  Cardiac cath 04/22/22: Conclusion      Prox LAD to Mid LAD lesion is 70% stenosed.   1st Diag-1 lesion is 100% stenosed.   1st Diag-2 lesion is 70% stenosed.   Prox RCA lesion is 95% stenosed.   Mid RCA lesion is 100% stenosed.  Prox Cx lesion is 40% stenosed.   1st Mrg lesion is 100% stenosed.   3rd Mrg lesion is 100% stenosed.   RPAV lesion is 95% stenosed.   A drug-eluting stent was successfully placed using a STENT ONYX FRONTIER [id number].   Post intervention, there is a 0% residual stenosis.   LIMA graft was visualized by angiography and is normal in caliber.   SVG graft was visualized by angiography and is normal in caliber.   SVG graft was visualized by angiography and is normal in caliber.   The graft exhibits no disease.   The graft exhibits no disease.   LV end diastolic pressure is mildly elevated.   3 vessel obstructive CAD, there is a 70% stenosis in the mid LAD which was not grafted Patent LIMA to a large diagonal branch. There is 70% stenosis in the diagonal distal to the graft. Patent SVG to OM1 and 2 Patent SVG to distal RCA. There is a severe stenosis in the PL branch distal to the graft. This appears to be the culprit lesion. Mildly elevated LVEDP 18 mm Hg Successful PCI of the PL branch of the RCA through the vein graft with DES x 1 Severe tortuosity and calcification of the aortoiliac system which made the procedure challenging.    Plan: DAPT with ASA and Brilinta  for 12 months. Monitor renal function closely. Would treat residual disease in the LAD and diagonal medically. The LAD could be treated with PCI if he has refractory symptoms.   Coronary Diagrams  Diagnostic Dominance: Right  Intervention   Implants      Echo 04/20/22: IMPRESSIONS     1. Left ventricular ejection fraction, by estimation, is 60 to 65%. The  left ventricle has normal function. The left ventricle has no regional  wall motion  abnormalities. There is mild left ventricular hypertrophy of  the basal-septal segment. Left  ventricular diastolic parameters are consistent with Grade I diastolic  dysfunction (impaired relaxation).   2. Right ventricular systolic function is normal. The right ventricular  size is normal.   3. The mitral valve is normal in structure. No evidence of mitral valve  regurgitation. No evidence of mitral stenosis.   4. The aortic valve is tricuspid. Aortic valve regurgitation is trivial.  Aortic valve sclerosis/calcification is present, without any evidence of  aortic stenosis.   5. The inferior vena cava is normal in size with greater than 50%  respiratory variability, suggesting right atrial pressure of 3 mmHg   Echo 01/15/24: Study Highlights      Normal sinus rhythm with first degree AV block. lowest HR at 4 am   One 6 beat run NSVT   Otherwise rare PAC and PVC   No significant tachy or bradyarrhythmia or AV block. no Afib     Patch Wear Time:  13 days and 23 hours (2025-04-13T09:09:21-0400 to 2025-04-27T09:09:13-0400)   Patient had a min HR of 43 bpm, max HR of 127 bpm, and avg HR of 75 bpm. Predominant underlying rhythm was Sinus Rhythm. First Degree AV Block was present. Bundle Branch Block/IVCD was present. 1 Ventricular Tachycardia runs occurred, the run with the  fastest interval lasting 6 beats with a max rate of 124 bpm, the longest lasting 6 beats with an avg rate of 116 bpm. Isolated SVEs were rare (<1.0%), SVE Couplets were rare (<1.0%), and no SVE Triplets were present. Isolated VEs were rare (<1.0%), VE  Couplets were rare (<1.0%), and no VE Triplets were present. Ventricular Bigeminy was  present.   Assessment / Plan: 1. Coronary disease status post CABG in 2008. Low risk Myoview  in December 2014. S/p NSTEMI in August 2023  with patent grafts. S/p stenting of PL branch through SVG. Now on ASA monotherapy. He has no significant angina.  We'll continue with his current therapy  including aspirin , metoprolol , and statin therapy.  2. Diabetes mellitus type 2- continue medical therapy per primary care. On Tradjenta .  3. Right bundle branch block, chronic  4. Dyslipidemia. On simvastatin  and fenofibrate . Good control. LDL 59. At goal.   5. CKD stage 3a  I will follow up in 6 months.

## 2024-02-29 ENCOUNTER — Encounter (HOSPITAL_COMMUNITY): Payer: Self-pay | Admitting: Emergency Medicine

## 2024-02-29 ENCOUNTER — Emergency Department (HOSPITAL_COMMUNITY)

## 2024-02-29 ENCOUNTER — Other Ambulatory Visit: Payer: Self-pay

## 2024-02-29 ENCOUNTER — Emergency Department (HOSPITAL_COMMUNITY)
Admission: EM | Admit: 2024-02-29 | Discharge: 2024-02-29 | Disposition: A | Discharge status: Home / Self Care | Attending: Emergency Medicine | Admitting: Emergency Medicine

## 2024-02-29 DIAGNOSIS — E119 Type 2 diabetes mellitus without complications: Secondary | ICD-10-CM | POA: Insufficient documentation

## 2024-02-29 DIAGNOSIS — I200 Unstable angina: Secondary | ICD-10-CM

## 2024-02-29 DIAGNOSIS — R0789 Other chest pain: Secondary | ICD-10-CM | POA: Diagnosis present

## 2024-02-29 DIAGNOSIS — Z7982 Long term (current) use of aspirin: Secondary | ICD-10-CM | POA: Insufficient documentation

## 2024-02-29 DIAGNOSIS — I25110 Atherosclerotic heart disease of native coronary artery with unstable angina pectoris: Secondary | ICD-10-CM | POA: Diagnosis not present

## 2024-02-29 DIAGNOSIS — Z7984 Long term (current) use of oral hypoglycemic drugs: Secondary | ICD-10-CM | POA: Insufficient documentation

## 2024-02-29 DIAGNOSIS — M542 Cervicalgia: Secondary | ICD-10-CM | POA: Insufficient documentation

## 2024-02-29 DIAGNOSIS — I959 Hypotension, unspecified: Secondary | ICD-10-CM | POA: Diagnosis not present

## 2024-02-29 DIAGNOSIS — I700 Atherosclerosis of aorta: Secondary | ICD-10-CM | POA: Diagnosis not present

## 2024-02-29 DIAGNOSIS — R079 Chest pain, unspecified: Secondary | ICD-10-CM

## 2024-02-29 DIAGNOSIS — R9389 Abnormal findings on diagnostic imaging of other specified body structures: Secondary | ICD-10-CM | POA: Diagnosis not present

## 2024-02-29 DIAGNOSIS — I4510 Unspecified right bundle-branch block: Secondary | ICD-10-CM | POA: Diagnosis not present

## 2024-02-29 LAB — CBC WITH DIFFERENTIAL/PLATELET
Abs Immature Granulocytes: 0.06 10*3/uL (ref 0.00–0.07)
Basophils Absolute: 0 10*3/uL (ref 0.0–0.1)
Basophils Relative: 0 %
Eosinophils Absolute: 0.2 10*3/uL (ref 0.0–0.5)
Eosinophils Relative: 3 %
HCT: 31.6 % — ABNORMAL LOW (ref 39.0–52.0)
Hemoglobin: 10.3 g/dL — ABNORMAL LOW (ref 13.0–17.0)
Immature Granulocytes: 1 %
Lymphocytes Relative: 20 %
Lymphs Abs: 1.7 10*3/uL (ref 0.7–4.0)
MCH: 30.6 pg (ref 26.0–34.0)
MCHC: 32.6 g/dL (ref 30.0–36.0)
MCV: 93.8 fL (ref 80.0–100.0)
Monocytes Absolute: 0.6 10*3/uL (ref 0.1–1.0)
Monocytes Relative: 8 %
Neutro Abs: 5.5 10*3/uL (ref 1.7–7.7)
Neutrophils Relative %: 68 %
Platelets: 236 10*3/uL (ref 150–400)
RBC: 3.37 MIL/uL — ABNORMAL LOW (ref 4.22–5.81)
RDW: 13.6 % (ref 11.5–15.5)
WBC: 8.2 10*3/uL (ref 4.0–10.5)
nRBC: 0 % (ref 0.0–0.2)

## 2024-02-29 LAB — COMPREHENSIVE METABOLIC PANEL WITH GFR
ALT: 10 U/L (ref 0–44)
AST: 19 U/L (ref 15–41)
Albumin: 2.9 g/dL — ABNORMAL LOW (ref 3.5–5.0)
Alkaline Phosphatase: 36 U/L — ABNORMAL LOW (ref 38–126)
Anion gap: 10 (ref 5–15)
BUN: 22 mg/dL (ref 8–23)
CO2: 24 mmol/L (ref 22–32)
Calcium: 8.5 mg/dL — ABNORMAL LOW (ref 8.9–10.3)
Chloride: 103 mmol/L (ref 98–111)
Creatinine, Ser: 1.39 mg/dL — ABNORMAL HIGH (ref 0.61–1.24)
GFR, Estimated: 48 mL/min — ABNORMAL LOW (ref >60)
Glucose, Bld: 127 mg/dL — ABNORMAL HIGH (ref 70–99)
Potassium: 4 mmol/L (ref 3.5–5.1)
Sodium: 137 mmol/L (ref 135–145)
Total Bilirubin: 0.6 mg/dL (ref 0.0–1.2)
Total Protein: 5.5 g/dL — ABNORMAL LOW (ref 6.5–8.1)

## 2024-02-29 LAB — CBG MONITORING, ED: Glucose-Capillary: 130 mg/dL — ABNORMAL HIGH (ref 70–99)

## 2024-02-29 LAB — MAGNESIUM: Magnesium: 2.2 mg/dL (ref 1.7–2.4)

## 2024-02-29 LAB — TROPONIN I (HIGH SENSITIVITY)
Troponin I (High Sensitivity): 17 ng/L (ref <18)
Troponin I (High Sensitivity): 17 ng/L (ref <18)

## 2024-02-29 LAB — LIPASE, BLOOD: Lipase: 40 U/L (ref 11–51)

## 2024-02-29 MED ORDER — NITROGLYCERIN 0.4 MG SL SUBL: 0.4000 mg | SUBLINGUAL_TABLET | SUBLINGUAL | 0 refills | Status: AC | PRN

## 2024-02-29 MED ORDER — ISOSORBIDE MONONITRATE ER 30 MG PO TB24: 30.0000 mg | ORAL_TABLET | Freq: Every day | ORAL | 0 refills | Status: DC

## 2024-02-29 NOTE — Discharge Instructions (Addendum)

## 2024-02-29 NOTE — Consult Note (Signed)
 Cardiology Consultation   Patient ID: ROHN FRITSCH MRN: [id number]; DOB: Apr 29, 1931  Admit date: 02/29/2024 Date of Consult: 02/29/2024  PCP:  Charlanne Fredia CROME, MD   [facility] Providers Cardiologist:  Peter Swaziland, MD        Patient Profile: Tony Lee is a 89 y.o. male with a hx of prior CAD/CABG, DM who is being seen 02/29/2024 for the evaluation of chest pain at the request of ED.  History of Present Illness: Tony Lee brought in from assisted living for chest discomfort - sharp  L sided, nonradiating that happened earlier today after lifting up a box of what sounds like diapers. Given nitroglycerin  by EMS with some relief, but it recurred.  Generally speaking, he isn't often bothered by chest discomfort but has been getting progressively weaker, and he tells me his chief complaint is that he had no energy to put his clothes on today.  ECG with first degree AVB and old RBBB, no ST T wave abnormalities. On telemetry, he had frequent blocked PACs. BP is stable No recent falls  Last cath 04/2022 for NSTEMI: Patent LIMA to diagonal, SVG to OM1/2, SVR to distal RCA, and had PCI to RPL.  Last echo in 2023 mostly structurally normal w/ preserved EF.  Has standing DNR order.  Past Medical History:  Diagnosis Date   Cancer (HCC) 09/10/2002   prostate   Chronic kidney disease, stage 3b (HCC)    Coronary artery disease    Depression    Diabetes mellitus type 2 in obese    Dyslipidemia    Esophageal stricture    Monoclonal paraproteinemia 03/03/2013   Palpitations    RBBB (right bundle branch block)     Past Surgical History:  Procedure Laterality Date   ANKLE SURGERY Left    APPENDECTOMY     CARDIAC CATHETERIZATION     Dr. Swaziland   CATARACT EXTRACTION W/ INTRAOCULAR LENS  IMPLANT, BILATERAL     CORONARY ARTERY BYPASS GRAFT  Oct. 2008   x 5,Dr.Gerhardt   CORONARY/GRAFT ACUTE MI REVASCULARIZATION N/A 04/22/2022   Procedure: Coronary/Graft Acute MI  Revascularization;  Surgeon: Swaziland, Peter M, MD;  Location[facility] INVASIVE CV LAB;  Service: Cardiovascular;  Laterality: N/A;   HERNIA REPAIR Bilateral    LEFT HEART CATH AND CORONARY ANGIOGRAPHY N/A 04/22/2022   Procedure: LEFT HEART CATH AND CORONARY ANGIOGRAPHY;  Surgeon: Swaziland, Peter M, MD;  Location: [facility] INVASIVE CV LAB;  Service: Cardiovascular;  Laterality: N/A;   PROSTATECTOMY  2004   radical   right shoulder      replacement   vein strippping         Scheduled Meds:  Continuous Infusions:  PRN Meds:   Allergies:    Allergies  Allergen Reactions   Other Itching     Mepergan Fortis, (meprozine)  = itching (patient disputes this in 2023)    Social History:   Social History   Socioeconomic History   Marital status: Married    Spouse name: [name]   Number of children: 3   Years of education: Not on file   Highest education level: Bachelor's degree (e.g., BA, AB, BS)  Occupational History   Occupation: printing-retired  Tobacco Use   Smoking status: Former    Current packs/day: 0.00    Average packs/day: 0.5 packs/day for 20.0 years (10.0 ttl pk-yrs)    Types: Cigarettes    Start date: 09/11/1947    Quit date: 09/11/1967    Years since quitting: 61.5  Smokeless tobacco: Never   Tobacco comments:    quit 45 years ago  Vaping Use   Vaping status: Never Used  Substance and Sexual Activity   Alcohol  use: Yes    Comment: social, 1-2 times per week    Drug use: No   Sexual activity: Not on file  Other Topics Concern   Not on file  Social History Narrative   Lives with wife at [facility]   Social Drivers of Health   Financial Resource Strain: Not on file  Food Insecurity: Patient Declined (01/31/2024)   Hunger Vital Sign    Worried About Running Out of Food in the Last Year: Patient declined    Ran Out of Food in the Last Year: Patient declined  Transportation Needs: Patient Declined (01/31/2024)   PRAPARE - Administrator, Civil Service  (Medical): Patient declined    Lack of Transportation (Non-Medical): Patient declined  Physical Activity: Not on file  Stress: Not on file  Social Connections: Unknown (01/31/2024)   Social Connection and Isolation Panel    Frequency of Communication with Friends and Family: Patient declined    Frequency of Social Gatherings with Friends and Family: Patient declined    Attends Religious Services: Not on Insurance claims handler of Clubs or Organizations: Patient declined    Attends Banker Meetings: Patient declined    Marital Status: Patient declined  Intimate Partner Violence: Unknown (01/31/2024)   Humiliation, Afraid, Rape, and Kick questionnaire    Fear of Current or Ex-Partner: Patient declined    Emotionally Abused: Patient declined    Physically Abused: Not on file    Sexually Abused: Patient declined    Family History:    Family History  Problem Relation Age of Onset   Heart failure Father        age 41   Heart attack Brother        age 69 post third open heart surgery   Heart attack Brother      ROS:  Please see the history of present illness.   All other ROS reviewed and negative.     Physical Exam/Data: Vitals:   02/29/24 1955 02/29/24 2005 02/29/24 2130 02/29/24 2132  BP:   132/70   Pulse: 66 70 68   Resp: (!) 21 19 (!) 22   Temp:    97.6 F (36.4 C)  TempSrc:    Oral  SpO2: 98% 98% 95%    No intake or output data in the 24 hours ending 02/29/24 2150    02/13/2024    9:18 AM 02/04/2024    1:12 PM 01/30/2024    6:19 PM  Last 3 Weights  Weight (lbs) 172 lb 171 lb 9.6 oz 157 lb  Weight (kg) 78.019 kg 77.837 kg 71.215 kg     There is no height or weight on file to calculate BMI.  General:  Well nourished, well developed, in no acute distress HEENT: normal Neck: no JVD Vascular: No carotid bruits; Distal pulses 2+ bilaterally Cardiac:  normal S1, S2; RRR; no murmur  Lungs:  clear to auscultation bilaterally, no wheezing, rhonchi or rales   Abd: soft, nontender, no hepatomegaly  Ext: no edema Musculoskeletal:  No deformities, BUE and BLE strength normal and equal Skin: warm and dry    Laboratory Data: High Sensitivity Troponin:   Recent Labs  Lab 02/29/24 1948  TROPONINIHS 17     Chemistry Recent Labs  Lab 02/29/24 1948  NA 137  K 4.0  CL 103  CO2 24  GLUCOSE 127*  BUN 22  CREATININE 1.39*  CALCIUM  8.5*  MG 2.2  GFRNONAA 48*  ANIONGAP 10    Recent Labs  Lab 02/29/24 1948  PROT 5.5*  ALBUMIN 2.9*  AST 19  ALT 10  ALKPHOS 36*  BILITOT 0.6   Lipids No results for input(s): CHOL, TRIG, HDL, LABVLDL, LDLCALC, CHOLHDL in the last 168 hours.  Hematology Recent Labs  Lab 02/29/24 1948  WBC 8.2  RBC 3.37*  HGB 10.3*  HCT 31.6*  MCV 93.8  MCH 30.6  MCHC 32.6  RDW 13.6  PLT 236   Thyroid  No results for input(s): TSH, FREET4 in the last 168 hours.  BNPNo results for input(s): BNP, PROBNP in the last 168 hours.  DDimer No results for input(s): DDIMER in the last 168 hours.  Radiology/Studies:  DG Chest Portable 1 View Result Date: 02/29/2024 CLINICAL DATA:  Chest pain EXAM: PORTABLE CHEST 1 VIEW COMPARISON:  01/30/2024 FINDINGS: Cardiac shadow is stable. Postsurgical changes are seen. Aortic calcifications are noted. Mild elevation of the right hemidiaphragm is seen. No bony abnormality is noted. IMPRESSION: No acute abnormality noted. Electronically Signed   By: Oneil Devonshire M.D.   On: 02/29/2024 20:04     Assessment and Plan: Chest discomfort and generalized weakness.  No ECG signs of ischemia, troponin is negative. While he does have prior CAD, the story does not sound like ACS, although he did seem to have some relief w/ nitroglycerin .  Given age and comorbidities as well as patient preference, less is more. Recommend starting a low dose antianginal (imdur 30). His home nitroglycerin  is well-expired, so will get him a fresh prescription. He happens to have a  follow-up routinely planned for Monday, so will discharge and have him follow- up.  Risk Assessment/Risk Scores:           [facility] will sign off.   The patient is ready for discharge today from a cardiac standpoint. Medication Recommendations:  imdur 30 Other recommendations (labs, testing, etc):  none Follow up as an outpatient:  routine follow-up w/ Dr. Swaziland  For questions or updates, please contact [facility] Please consult [email or web address] for contact info under    Signed, Andee Flatten, MD  02/29/2024 9:50 PM

## 2024-02-29 NOTE — ED Provider Notes (Signed)
  Physical Exam  BP 132/70   Pulse 68   Temp 97.6 F (36.4 C) (Oral)   Resp (!) 22   SpO2 95%   Physical Exam  Procedures  Procedures  ED Course / MDM   Clinical Course as of 02/29/24 2137  Sat Feb 29, 2024  2022 Hemoglobin(!): 10.3 Reviewed, consistent with findings from 4 weeks prior. [JG]  2047 Creatinine(!): 1.39 Reviewed, findings are consistent with previous readings over the last several months, and is actually decreased from several months prior. [JG]  2048 Calcium (!): 8.5 Reviewed, decreased from previous reading 4 weeks prior [JG]  2048 GFR, Estimated(!): 48 Reviewed, consistent with previous readings. [JG]  2048 DG Chest Portable 1 View Reviewed, does not show any acute cardiopulmonary issues. [JG]    Clinical Course User Index [JG] Myriam Dorn BROCKS, PA   Medical Decision Making Amount and/or Complexity of Data Reviewed Labs: ordered. Decision-making details documented in ED Course. Radiology: ordered. Decision-making details documented in ED Course.  Risk Prescription drug management.   Here for left sided cp after lifting some boxes earlier.  Relieved with nitroglycerin  and aspirin .  Cardiology consult is recommending Imdur.  If troponins are flat he may go home with prescriptions.  Repeat troponin flat. Safe for discharge     Adaijah Endres, PA-C 02/29/24 2340    Randol Simmonds, MD 03/01/24 [phone]

## 2024-02-29 NOTE — ED Triage Notes (Signed)
 PT BIB EMS from his assisted living for chest pain, started around 4 pm, sharp left sided pain, not radiating to any other place, denies any other symptoms such as nausea, vomiting, or being diaphoretic.    12 lead chronic RBB Cardiac History 324 Aspirin  2 sublingual nitroglycerin  22 left wrist

## 2024-02-29 NOTE — ED Provider Notes (Cosign Needed Addendum)
 [place] EMERGENCY DEPARTMENT AT [facility] Provider Note   CSN: [id number] Arrival date & time: 02/29/24  8147     Patient presents with: Chest Pain   Tony Lee is a 89 y.o. male presents brought in by EMS for left-sided chest pain that began suddenly while he was at rest at his home today in assisted living.  Notable previous medical history of right bundle branch block, dyslipidemia, CAD, type 2 diabetes, previous NSTEMI, gastroenteritis.  Stated prior to onset he was lifting some heavy boxes of briefs at his home, had went to go lay down when the pain suddenly started in the left side of his chest which he describes as a severe stabbing pain that was not radiating to the left arm or to the neck.  He has no associated dyspnea, denies any nausea or vomiting.  Prior to his arrival at the ED he received 324 of aspirin  by EMS and had received 2 doses of sublingual nitroglycerin  by the facility prior to EMS arrival.    Chest Pain Associated symptoms: no palpitations and no shortness of breath        Prior to Admission medications   Medication Sig Start Date End Date Taking? Authorizing Provider  aspirin  EC 81 MG tablet Take 1 tablet (81 mg total) by mouth daily. Swallow whole. 04/24/22   Kaul, Aseem, MD  bismuth subsalicylate (PEPTO BISMOL) 262 MG chewable tablet Chew 524 mg by mouth as needed.    [provider]  Black Pepper-Turmeric 3-500 MG CAPS Take 1 capsule by mouth every Monday, Wednesday, and Friday.    [provider]  Cyanocobalamin (VITAMIN B-12 PO) Take 5,000 mcg by mouth at bedtime.    [provider]  docusate sodium  (COLACE) 100 MG capsule Take 100 mg by mouth 2 (two) times daily.    [provider]  FARXIGA 5 MG TABS tablet Take 5 mg by mouth daily. 02/08/21   [provider]  fenofibrate  (TRICOR ) 48 MG tablet Take 1 tablet (48 mg total) by mouth daily. 01/07/23   Swaziland, Peter M, MD  loratadine (CLARITIN) 10 MG  tablet Take 10 mg by mouth daily.    [provider]  meclizine  (ANTIVERT ) 12.5 MG tablet Take 1 tablet (12.5 mg total) by mouth 3 (three) times daily as needed for dizziness. 07/31/23   Charlanne Fredia CROME, MD  Multiple Vitamins-Minerals (PRESERVISION AREDS 2) CAPS Take 1 capsule by mouth in the morning and at bedtime.    [provider]  nitroGLYCERIN  (NITROSTAT ) 0.4 MG SL tablet PLACE 1 TABLET (0.4 MG TOTAL) UNDER THE TONGUE EVERY 5 (FIVE) MINUTES AS NEEDED. Patient taking differently: Place 0.4 mg under the tongue every 5 (five) minutes as needed for chest pain. 06/27/22   Swaziland, Peter M, MD  omeprazole (PRILOSEC) 40 MG capsule Take 40 mg by mouth 2 (two) times daily. 01/08/13   [provider]  ondansetron  (ZOFRAN ) 4 MG tablet Take 4 mg by mouth every 8 (eight) hours as needed for nausea or vomiting.    [provider]  polyethylene glycol (MIRALAX  / GLYCOLAX ) 17 g packet Take 17 g by mouth daily as needed for mild constipation, moderate constipation or severe constipation.    [provider]  simvastatin  (ZOCOR ) 20 MG tablet Take 20 mg by mouth at bedtime.      [provider]  TRADJENTA  5 MG TABS tablet Take 5 mg by mouth daily. 09/17/15   [provider]  Turmeric (QC  TUMERIC COMPLEX PO) Take 3-500 mg by mouth every Monday, Wednesday, and Friday with hemodialysis.    [provider]    Allergies: Other    Review of Systems  Respiratory:  Negative for chest tightness and shortness of breath.   Cardiovascular:  Positive for chest pain. Negative for palpitations.  All other systems reviewed and are negative.   Updated Vital Signs There were no vitals taken for this visit.  Physical Exam Vitals and nursing note reviewed.  Constitutional:      General: He is not in acute distress.    Appearance: Normal appearance.  HENT:     Head: Normocephalic and atraumatic.     Mouth/Throat:     Mouth: Mucous membranes are moist.      Pharynx: Oropharynx is clear.   Eyes:     Extraocular Movements: Extraocular movements intact.     Conjunctiva/sclera: Conjunctivae normal.     Pupils: Pupils are equal, round, and reactive to light.    Cardiovascular:     Rate and Rhythm: Normal rate and regular rhythm.     Pulses: Normal pulses.          Carotid pulses are 2+ on the right side with bruit and 2+ on the left side with bruit.      Radial pulses are 2+ on the right side and 2+ on the left side.     Heart sounds: Normal heart sounds, S1 normal and S2 normal. No murmur heard.    No friction rub. No gallop.  Pulmonary:     Effort: Pulmonary effort is normal.     Breath sounds: Normal breath sounds and air entry.  Abdominal:     General: Abdomen is flat. Bowel sounds are normal.     Palpations: Abdomen is soft.   Musculoskeletal:        General: Normal range of motion.     Cervical back: Normal range of motion and neck supple.     Right lower leg: No edema.     Left lower leg: No edema.   Skin:    General: Skin is warm and dry.     Capillary Refill: Capillary refill takes less than 2 seconds.   Neurological:     General: No focal deficit present.     Mental Status: He is alert. Mental status is at baseline.   Psychiatric:        Mood and Affect: Mood normal.     (all labs ordered are listed, but only abnormal results are displayed) Labs Reviewed  COMPREHENSIVE METABOLIC PANEL WITH GFR  MAGNESIUM  LIPASE, BLOOD  CBC WITH DIFFERENTIAL/PLATELET  CBG MONITORING, ED  TROPONIN I (HIGH SENSITIVITY)    EKG: None  Radiology: No results found.   Procedures   Medications Ordered in the ED - No data to display  Clinical Course as of 02/29/24 2058  Sat Feb 29, 2024  2022 Hemoglobin(!): 10.3 Reviewed, consistent with findings from 4 weeks prior. [JG]  2047 Creatinine(!): 1.39 Reviewed, findings are consistent with previous readings over the last several months, and is actually decreased from  several months prior. [JG]  2048 Calcium (!): 8.5 Reviewed, decreased from previous reading 4 weeks prior [JG]  2048 GFR, Estimated(!): 48 Reviewed, consistent with previous readings. [JG]  2048 DG Chest Portable 1 View Reviewed, does not show any acute cardiopulmonary issues. [JG]    Clinical Course User Index [JG] Myriam Dorn BROCKS, PA  Medical Decision Making Amount and/or Complexity of Data Reviewed Labs: ordered. Decision-making details documented in ED Course. Radiology: ordered. Decision-making details documented in ED Course.  Risk Prescription drug management.   Medical Decision Making:   Tony Lee is a 89 y.o. male who presented to the ED today with left-sided chest pain detailed above.    Handoff received from EMS.  External chart has been reviewed including previous ED visits. Patient placed on continuous vitals and telemetry monitoring while in ED which was reviewed periodically.  Complete initial physical exam performed, notably the patient  was alert and oriented in no apparent distress.  He states that after receiving nitroglycerin  his pain is reduced to 4/10.  Pulmonary and cardiac auscultation are unremarkable with   Reviewed and confirmed nursing documentation for past medical history, family history, social history.    Initial Assessment:   With the patient's presentation of left-sided chest pain, most likely diagnosis is ACS. Other diagnoses were considered including (but not limited to) pulmonary embolus, pericarditis. These are considered less likely due to history of present illness and physical exam findings.     Initial Plan:    Screening labs including CBC and Metabolic panel to evaluate for infectious or metabolic etiology of disease.  Serum lipase to assess for potential pancreatic etiology. CXR to evaluate for structural/infectious intrathoracic pathology.  EKG and serial troponin to evaluate for cardiac  pathology. Objective evaluation as below reviewed   Initial Study Results:   Laboratory  All laboratory results reviewed without evidence of clinically relevant pathology.   Exceptions include: RBC at 3.37, hemoglobin 10.3.  Creatinine 1.39, calcium  8.5, reduced GFR at 48.  EKG EKG was reviewed independently. Rate, rhythm, axis, intervals all examined.  Found previous noted right bundle branch block.  Underlying rhythm shows second-degree AV block type II. ST segments without concerns for elevations.    Radiology:  All images reviewed independently. Agree with radiology report at this time.   CT ABDOMEN PELVIS W CONTRAST Result Date: 01/31/2024 CLINICAL DATA:  Sepsis. Gastroenteritis. Evaluate for intra-abdominal source of infection. EXAM: CT ABDOMEN AND PELVIS WITH CONTRAST TECHNIQUE: Multidetector CT imaging of the abdomen and pelvis was performed using the standard protocol following bolus administration of intravenous contrast. RADIATION DOSE REDUCTION: This exam was performed according to the departmental dose-optimization program which includes automated exposure control, adjustment of the mA and/or kV according to patient size and/or use of iterative reconstruction technique. CONTRAST:  75mL OMNIPAQUE  IOHEXOL  350 MG/ML SOLN COMPARISON:  No comparison studies available. FINDINGS: Lower chest: Calcified granuloma noted right lower lobe. Dependent atelectasis noted in the lung bases. Hepatobiliary: No suspicious focal abnormality within the liver parenchyma. There is no evidence for gallstones, gallbladder wall thickening, or pericholecystic fluid. No intrahepatic or extrahepatic biliary dilation. Pancreas: Pancreatic duct in the body and tail of the pancreas is dilated with atrophy of the overlying pancreatic parenchyma. There is abrupt ductal cut off at the level of the pancreatic neck (axial 24/4 and coronal 62/7. Dystrophic calcification is seen in the pancreatic tail. No obstructing mass  lesion is evident by CT and given the findings suggesting a history of chronic pancreatitis, the ductal dilatation may be secondary to chronic inflammation. Occult neoplasm is not excluded. Spleen: Heterogeneous parenchyma with innumerable tiny hypoattenuating lesions in scattered calcified granulomata. Adrenals/Urinary Tract: No adrenal nodule or mass. Cortical thinning noted in both kidneys without hydronephrosis or suspicious renal mass. No evidence for hydroureter. The urinary bladder appears normal for the degree of distention. Stomach/Bowel:  Stomach is decompressed which accentuates wall thickness. Duodenum is normally positioned as is the ligament of Treitz. No small bowel wall thickening. No small bowel dilatation. The terminal ileum is normal. Nonvisualization of the appendix is consistent with the reported history of appendectomy. No gross colonic mass. No colonic wall thickening. Vascular/Lymphatic: Focal infrarenal saccular aneurysmal dilatation of the abdominal aorta noted with maximum orthogonal diameters of 4.2 by 3.5 cm There is no gastrohepatic or hepatoduodenal ligament lymphadenopathy. No retroperitoneal or mesenteric lymphadenopathy. No pelvic sidewall lymphadenopathy. Reproductive: Prostatectomy. Other: No intraperitoneal free fluid. Musculoskeletal: Right groin hernia contains only fat. Surgical clips are seen in the groin regions bilaterally, right greater than left. No worrisome lytic or sclerotic osseous abnormality. Advanced degenerative disc disease noted in the lumbar spine. IMPRESSION: 1. No acute findings in the abdomen or pelvis. Specifically, no findings to explain the patient's history of sepsis. 2. Pancreatic duct in the body and tail of the pancreas is dilated with atrophy of the overlying pancreatic parenchyma. There is abrupt ductal cut off at the level of the pancreatic neck. No obstructing mass lesion is evident by CT and given the findings suggesting a history of chronic  pancreatitis, the ductal dilatation may be secondary to chronic inflammation. Occult neoplasm is not excluded. Close follow-up warranted. Given the lack of mass effect in this region the pancreas, MRI of the abdomen with and without contrast may be of minimal benefit. 3. Heterogeneous parenchyma of the spleen with innumerable tiny hypoattenuating lesions., nonspecific. 4. 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 Recommend follow-up CT or MR as appropriate in 12 months and referral to or continued care with vascular specialist. (Ref.: J Vasc Surg. 2018; 67:2-77 and J Am Coll Radiol 2013;10(10):789-794.) 5. Right groin hernia contains only fat. Electronically Signed   By: Camellia Candle M.D.   On: 01/31/2024 06:15   DG Chest Port 1 View Result Date: 01/30/2024 CLINICAL DATA:  Fevers EXAM: PORTABLE CHEST 1 VIEW COMPARISON:  04/20/2022 FINDINGS: Check shadow is stable. Postsurgical changes are noted. Aortic calcifications are seen. Lungs are well aerated bilaterally. No focal infiltrate or effusion is seen. No bony abnormality is noted. IMPRESSION: No active disease. Electronically Signed   By: Oneil Devonshire M.D.   On: 01/30/2024 21:46      Consults: Case discussed with Dr. Cesario with cardiology.   Reassessment and Plan:   Ongoing workup of this patient includes repeat troponin, initial troponin noted at 17.  Remainder of lab values are unremarkable in comparison to previous readings.  Chest x-ray is unremarkable does not show any active cardiopulmonary disease.  Reevaluation the patient shows that he has decreased pain to 1/10 pain currently.  Review of continuous monitoring of the EKG does show the likely secondary type II AV block, cardiology consulted on the same.  Based on discussion with Dr. Cesario with cardiology, she believes this to be multiple PACs causing the missed beats.  As a result we will prescribe, at Dr. Sheria  recommendation, Imdur 30 mg daily.  Also will refill patient's pre-existing nitroglycerin  as needed medications.  Signed off to off going shift with anticipation of discharge given normal troponin.       Final diagnoses:  None    ED Discharge Orders     None          Myriam Dorn BROCKS, [place] 02/29/24 2109    Melvenia Motto, MD 02/29/24 2115    Melvenia Motto, MD  02/29/24 2122    Myriam Dorn BROCKS, PA 02/29/24 2128    Melvenia Motto, MD 03/01/24 2101

## 2024-03-02 ENCOUNTER — Ambulatory Visit: Attending: Cardiology | Admitting: Cardiology

## 2024-03-02 ENCOUNTER — Encounter: Payer: Self-pay | Admitting: Cardiology

## 2024-03-02 VITALS — BP 128/70 | HR 90 | Ht 67.0 in | Wt 155.0 lb

## 2024-03-02 DIAGNOSIS — I25708 Atherosclerosis of coronary artery bypass graft(s), unspecified, with other forms of angina pectoris: Secondary | ICD-10-CM

## 2024-03-02 DIAGNOSIS — E785 Hyperlipidemia, unspecified: Secondary | ICD-10-CM

## 2024-03-02 DIAGNOSIS — R002 Palpitations: Secondary | ICD-10-CM | POA: Diagnosis not present

## 2024-03-02 DIAGNOSIS — E119 Type 2 diabetes mellitus without complications: Secondary | ICD-10-CM

## 2024-03-02 MED ORDER — ISOSORBIDE MONONITRATE ER 30 MG PO TB24: 30.0000 mg | ORAL_TABLET | Freq: Every day | ORAL | 3 refills | Status: DC

## 2024-03-02 NOTE — Patient Instructions (Signed)
 Medication Instructions:  Continue same medications *If you need a refill on your cardiac medications before your next appointment, please call your pharmacy*  Lab Work: None ordered  Testing/Procedures: None ordered  Follow-Up: At [facility], you and your health needs are our priority.  As part of our continuing mission to provide you with exceptional heart care, our providers are all part of one team.  This team includes your primary Cardiologist (physician) and Advanced Practice Providers or APPs (Physician Assistants and Nurse Practitioners) who all work together to provide you with the care you need, when you need it.  Your next appointment:  6 months    Call in July to schedule Dec appointment     Provider:  Dr.Jordan    We recommend signing up for the patient portal called MyChart.  Sign up information is provided on this After Visit Summary.  MyChart is used to connect with patients for Virtual Visits (Telemedicine).  Patients are able to view lab/test results, encounter notes, upcoming appointments, etc.  Non-urgent messages can be sent to your provider as well.   To learn more about what you can do with MyChart, go to [email or web address].

## 2024-03-03 ENCOUNTER — Encounter: Payer: PPO | Admitting: Internal Medicine

## 2024-03-03 DIAGNOSIS — E118 Type 2 diabetes mellitus with unspecified complications: Secondary | ICD-10-CM | POA: Diagnosis not present

## 2024-03-04 ENCOUNTER — Encounter: Payer: Self-pay | Admitting: Orthopedic Surgery

## 2024-03-04 ENCOUNTER — Non-Acute Institutional Stay: Payer: Self-pay | Admitting: Orthopedic Surgery

## 2024-03-04 DIAGNOSIS — I200 Unstable angina: Secondary | ICD-10-CM

## 2024-03-04 DIAGNOSIS — N1832 Chronic kidney disease, stage 3b: Secondary | ICD-10-CM

## 2024-03-04 DIAGNOSIS — R531 Weakness: Secondary | ICD-10-CM

## 2024-03-04 DIAGNOSIS — E1122 Type 2 diabetes mellitus with diabetic chronic kidney disease: Secondary | ICD-10-CM

## 2024-03-04 NOTE — Progress Notes (Signed)
 Location:  [facility] Room Number: 512 A Place of Service:  ALF [phone]) Provider:  Greig Cluster, NP    Patient Care Team: Charlanne Fredia CROME, MD as PCP - General (Internal Medicine) [place], Peter M, MD as PCP - Cardiology (Cardiology) Tat, Asberry RAMAN, DO as Attending Physician (Neurology) [place], Peter M, MD as Attending Physician (Cardiology) Twana Jeneal DASEN, MD (Hematology and Oncology) Rosalie Kitchens, MD as Attending Physician (Gastroenterology)  Extended Emergency Contact Information Primary Emergency Contact: Mitton,doug Mobile Phone: [phone] Relation: Son Secondary Emergency Contact: [facility] Phone: [phone] Relation: Daughter  Code Status: DNR Goals of care: Advanced Directive information    02/29/2024   10:05 PM  Advanced Directives  Does Patient Have a Medical Advance Directive? No  Would patient like information on creating a medical advance directive? No - Patient declined     Chief Complaint  Patient presents with   Acute Visit    Weakness     HPI:  Pt is a 89 y.o. male seen today for acute visit due to weakness.   He currently resides on the assisted living unit at [facility]. PMH: CAD, NSTEMI 2023, RBBB, unstable angina, T2DM, neuropathy, prostate cancer 2004 and unstable gait.   AL nurse reports increased weakness. He was followed by PT not long ago, but abruptly stopped sessions. Nursing has been having to help him get out of chair/recliner more often. He is not using rolator correctly. Gait described as shuffling. Nursing is using gait belt more often when they see him walking halls. No recent falls or injuries. He agrees to restart PT, requesting Rob as therapist.   Recent A1c 7.1> was 7.9 (02/24). He admits to eating sugar packs, chocolate or cookies prior to bedtime to prevent weakness in the middle of the night. Morning blood sugars > 130's x 1 month. No episodes of hypoglycemia. Remains on tradjenta  and [place].    06/21 ED evaluation due to chest pain. Symptoms relieved with nitroglycerin  and aspirin . Troponin was normal. Cardiology recommended Imdur. 06/23 seen by cardiology, recommended continuing Imdur, asa and statin.    Past Medical History:  Diagnosis Date   Cancer (HCC) 09/10/2002   prostate   Chronic kidney disease, stage 3b (HCC)    Coronary artery disease    Depression    Diabetes mellitus type 2 in obese    Dyslipidemia    Esophageal stricture    Monoclonal paraproteinemia 03/03/2013   Palpitations    RBBB (right bundle branch block)    Past Surgical History:  Procedure Laterality Date   ANKLE SURGERY Left    APPENDECTOMY     CARDIAC CATHETERIZATION     Dr. Swaziland   CATARACT EXTRACTION W/ INTRAOCULAR LENS  IMPLANT, BILATERAL     CORONARY ARTERY BYPASS GRAFT  Oct. 2008   x 5,Dr.Gerhardt   CORONARY/GRAFT ACUTE MI REVASCULARIZATION N/A 04/22/2022   Procedure: Coronary/Graft Acute MI Revascularization;  Surgeon: Swaziland, Peter M, MD;  Location: [facility] INVASIVE CV LAB;  Service: Cardiovascular;  Laterality: N/A;   HERNIA REPAIR Bilateral    LEFT HEART CATH AND CORONARY ANGIOGRAPHY N/A 04/22/2022   Procedure: LEFT HEART CATH AND CORONARY ANGIOGRAPHY;  Surgeon: Swaziland, Peter M, MD;  Location: [facility] INVASIVE CV LAB;  Service: Cardiovascular;  Laterality: N/A;   PROSTATECTOMY  2004   radical   right shoulder      replacement   vein strippping      Allergies  Allergen Reactions   Other Itching     Mepergan Fortis, (  meprozine)  = itching (patient disputes this in 2023)    Outpatient Encounter Medications as of 03/04/2024  Medication Sig   aspirin  EC 81 MG tablet Take 1 tablet (81 mg total) by mouth daily. Swallow whole.   bismuth subsalicylate (PEPTO BISMOL) 262 MG chewable tablet Chew 524 mg by mouth as needed.   Black Pepper-Turmeric 3-500 MG CAPS Take 1 capsule by mouth every Monday, Wednesday, and Friday.   Cyanocobalamin (VITAMIN B-12 PO) Take 5,000 mcg by mouth at bedtime.    docusate sodium  (COLACE) 100 MG capsule Take 100 mg by mouth 2 (two) times daily.   FARXIGA 5 MG TABS tablet Take 5 mg by mouth daily.   fenofibrate  (TRICOR ) 48 MG tablet Take 1 tablet (48 mg total) by mouth daily.   isosorbide mononitrate (IMDUR) 30 MG 24 hr tablet Take 1 tablet (30 mg total) by mouth daily for 6 days.   loratadine (CLARITIN) 10 MG tablet Take 10 mg by mouth daily.   meclizine  (ANTIVERT ) 12.5 MG tablet Take 1 tablet (12.5 mg total) by mouth 3 (three) times daily as needed for dizziness.   Multiple Vitamins-Minerals (PRESERVISION AREDS 2) CAPS Take 1 capsule by mouth in the morning and at bedtime.   nitroGLYCERIN  (NITROSTAT ) 0.4 MG SL tablet Place 1 tablet (0.4 mg total) under the tongue every 5 (five) minutes as needed for chest pain.   omeprazole (PRILOSEC) 40 MG capsule Take 40 mg by mouth 2 (two) times daily.   ondansetron  (ZOFRAN ) 4 MG tablet Take 4 mg by mouth every 8 (eight) hours as needed for nausea or vomiting.   polyethylene glycol (MIRALAX  / GLYCOLAX ) 17 g packet Take 17 g by mouth daily as needed for mild constipation, moderate constipation or severe constipation.   simvastatin  (ZOCOR ) 20 MG tablet Take 20 mg by mouth at bedtime.     TRADJENTA  5 MG TABS tablet Take 5 mg by mouth daily.   Turmeric (QC TUMERIC COMPLEX PO) Take 3-500 mg by mouth every Monday, Wednesday, and Friday with hemodialysis.   nitroGLYCERIN  (NITROSTAT ) 0.4 MG SL tablet PLACE 1 TABLET (0.4 MG TOTAL) UNDER THE TONGUE EVERY 5 (FIVE) MINUTES AS NEEDED. (Patient not taking: Reported on 03/04/2024)   No facility-administered encounter medications on file as of 03/04/2024.    Review of Systems  Constitutional:  Negative for fatigue and fever.  HENT:  Negative for sore throat and trouble swallowing.   Respiratory:  Negative for cough and shortness of breath.   Cardiovascular:  Positive for chest pain. Negative for leg swelling.  Gastrointestinal:  Negative for abdominal distention and abdominal pain.   Endocrine: Negative for polydipsia, polyphagia and polyuria.  Genitourinary:  Negative for frequency.  Musculoskeletal:  Positive for gait problem.  Skin:  Negative for wound.  Neurological:  Positive for weakness. Negative for dizziness and headaches.  Psychiatric/Behavioral:  Positive for confusion and dysphoric mood. Negative for sleep disturbance. The patient is not nervous/anxious.     Immunization History  Administered Date(s) Administered   Influenza, Quadrivalent, Recombinant, Inj, Pf 05/06/2018, 06/05/2019, 06/21/2020, 06/05/2021, 05/30/2023   Influenza-Unspecified 06/05/2021, 07/02/2023   Moderna Sars-Covid-2 Vaccination 09/22/2019, 10/20/2019, 07/26/2020, 06/27/2021   PNEUMOCOCCAL CONJUGATE-20 06/29/2014   Pneumococcal Conjugate-13 06/21/2013   Pneumococcal-Unspecified 08/12/2013   Td 07/29/2009, 07/29/2013   Tetanus 11/06/2023   Zoster Recombinant(Shingrix) 01/20/2013   Pertinent  Health Maintenance Due  Topic Date Due   INFLUENZA VACCINE  04/10/2024   HEMOGLOBIN A1C  08/03/2024   FOOT EXAM  11/03/2024   OPHTHALMOLOGY EXAM  11/25/2024      04/21/2022    9:00 AM 04/22/2022    7:45 AM 04/22/2022   10:00 PM 04/23/2022   10:00 AM 07/23/2023    2:39 PM  Fall Risk  Falls in the past year?     0  Was there an injury with Fall?     0  Fall Risk Category Calculator     0  (RETIRED) Patient Fall Risk Level High fall risk  High fall risk  High fall risk  High fall risk    Patient at Risk for Falls Due to     Impaired balance/gait;Impaired mobility  Fall risk Follow up     Falls evaluation completed     Data saved with a previous flowsheet row definition   Functional Status Survey:    Vitals:   03/04/24 0932  BP: (!) 155/80  Pulse: 74  Temp: 97.8 F (36.6 C)  SpO2: 95%  Weight: 165 lb (74.8 kg)  Height: 5' 7 (1.702 m)   Body mass index is 25.84 kg/m. Physical Exam Vitals reviewed.  Constitutional:      General: He is not in acute distress. HENT:      Head: Normocephalic.   Eyes:     General:        Right eye: No discharge.        Left eye: No discharge.    Cardiovascular:     Rate and Rhythm: Normal rate and regular rhythm.     Pulses: Normal pulses.     Heart sounds: Normal heart sounds.  Pulmonary:     Effort: Pulmonary effort is normal.     Breath sounds: Normal breath sounds.  Abdominal:     General: There is no distension.     Palpations: Abdomen is soft.     Tenderness: There is no abdominal tenderness.   Musculoskeletal:     Cervical back: Neck supple.     Right lower leg: No edema.     Left lower leg: No edema.     Comments: Able to move extremities without difficulty, needed assistance getting out of chair, WBAT, shuffling gait, does not walk  in rolator   Skin:    General: Skin is warm.     Capillary Refill: Capillary refill takes less than 2 seconds.   Neurological:     General: No focal deficit present.     Mental Status: He is alert. Mental status is at baseline.     Motor: Weakness present.     Gait: Gait abnormal.   Psychiatric:        Mood and Affect: Mood normal.     Labs reviewed: Recent Labs    01/30/24 1920 02/01/24 0501 02/25/24 0000 02/29/24 1948  NA 136 134* 132* 137  K 4.0 3.9 4.1 4.0  CL 105 106 97* 103  CO2 22 21* 20 24  GLUCOSE 193* 118*  --  127*  BUN 30* 21 20 22   CREATININE 1.58* 1.31* 1.4* 1.39*  CALCIUM  8.1* 7.4* 9.3 8.5*  MG  --   --   --  2.2   Recent Labs    12/18/23 1532 01/30/24 1920 02/25/24 0000 02/29/24 1948  AST 11* 24 24 19   ALT 7 13 14 10   ALKPHOS 46 37* 66 36*  BILITOT 0.4 0.4  --  0.6  PROT 5.9* 5.7*  --  5.5*  ALBUMIN 3.6 2.9* 3.8 2.9*   Recent Labs    01/30/24 1920 02/01/24 0501 02/25/24 0000  02/29/24 1948  WBC 8.6 7.9 4.0 8.2  NEUTROABS 8.0* 6.5  --  5.5  HGB 12.1* 10.8* 12.3* 10.3*  HCT 37.3* 33.4* 37* 31.6*  MCV 94.0 93.0  --  93.8  PLT 230 199 238 236   Lab Results  Component Value Date   TSH 3.233 04/21/2022   Lab Results   Component Value Date   HGBA1C 6.9 (H) 02/01/2024   Lab Results  Component Value Date   CHOL 96 11/04/2023   HDL 44 11/04/2023   LDLCALC 42 11/04/2023   TRIG 52 11/04/2023   CHOLHDL 2.8 04/21/2022    Significant Diagnostic Results in last 30 days:  DG Chest Portable 1 View Result Date: 02/29/2024 CLINICAL DATA:  Chest pain EXAM: PORTABLE CHEST 1 VIEW COMPARISON:  01/30/2024 FINDINGS: Cardiac shadow is stable. Postsurgical changes are seen. Aortic calcifications are noted. Mild elevation of the right hemidiaphragm is seen. No bony abnormality is noted. IMPRESSION: No acute abnormality noted. Electronically Signed   By: Oneil Devonshire M.D.   On: 02/29/2024 20:04    Assessment/Plan 1. Weakness (Primary) - ongoing - 06/16 Zoloft stopped by Dr. Charlanne - needing more help from AL nursing> trouble with transfers, shuffling gait and needing gait belt more - PT evaluation> requesting Rob  2. Type 2 diabetes mellitus with stage 3b chronic kidney disease, without long-term current use of insulin  (HCC) - Recent A1c 7.1> was 7.9 - morning sugars > 130's - no hypoglycemia - admits to eating sugar packets, chocolate or cookies at night - discussed trying protein bars or peanut butter crackers  - cont Farxiga and Tradjenta   3. Unstable angina (HCC) - 06/21 ED evaluation> cp after picking up boxes - troponin unremarkable - 06/23 cardiology continued Imdur - denies cp today - cont asa and statin    Family/ staff Communication: plan discussed with patient and nurse  Labs/tests ordered:  none

## 2024-03-10 ENCOUNTER — Encounter: Payer: Self-pay | Admitting: Internal Medicine

## 2024-03-10 ENCOUNTER — Non-Acute Institutional Stay: Admitting: Internal Medicine

## 2024-03-10 VITALS — BP 124/78 | HR 88 | Temp 98.2°F | Ht 67.0 in | Wt 164.0 lb

## 2024-03-10 DIAGNOSIS — N1832 Chronic kidney disease, stage 3b: Secondary | ICD-10-CM

## 2024-03-10 DIAGNOSIS — G3184 Mild cognitive impairment, so stated: Secondary | ICD-10-CM

## 2024-03-10 DIAGNOSIS — M6281 Muscle weakness (generalized): Secondary | ICD-10-CM | POA: Diagnosis not present

## 2024-03-10 DIAGNOSIS — R531 Weakness: Secondary | ICD-10-CM | POA: Diagnosis not present

## 2024-03-10 DIAGNOSIS — E782 Mixed hyperlipidemia: Secondary | ICD-10-CM

## 2024-03-10 DIAGNOSIS — N1831 Chronic kidney disease, stage 3a: Secondary | ICD-10-CM

## 2024-03-10 DIAGNOSIS — R42 Dizziness and giddiness: Secondary | ICD-10-CM | POA: Diagnosis not present

## 2024-03-10 DIAGNOSIS — R2681 Unsteadiness on feet: Secondary | ICD-10-CM | POA: Diagnosis not present

## 2024-03-10 DIAGNOSIS — D631 Anemia in chronic kidney disease: Secondary | ICD-10-CM

## 2024-03-10 DIAGNOSIS — E1122 Type 2 diabetes mellitus with diabetic chronic kidney disease: Secondary | ICD-10-CM

## 2024-03-10 DIAGNOSIS — F32A Depression, unspecified: Secondary | ICD-10-CM | POA: Diagnosis not present

## 2024-03-10 DIAGNOSIS — R262 Difficulty in walking, not elsewhere classified: Secondary | ICD-10-CM | POA: Diagnosis not present

## 2024-03-10 MED ORDER — PANTOPRAZOLE SODIUM 40 MG PO TBEC: 40.0000 mg | DELAYED_RELEASE_TABLET | Freq: Two times a day (BID) | ORAL | Status: DC

## 2024-03-10 NOTE — Progress Notes (Signed)
 Location: [facility] features editor of Service:  Clinic (12)  Provider:   Code Status: DNR Goals of Care:     02/29/2024   10:05 PM  Advanced Directives  Does Patient Have a Medical Advance Directive? No  Would patient like information on creating a medical advance directive? No - Patient declined     Chief Complaint  Patient presents with   Acute Visit    Patient has concerns about losing his sense of taste. He states every time he put something in his mouth his eyes water and also has a nasal drip.     HPI: Patient is a 89 y.o. male seen today for an acute visit for Weakness Chest pain and Runny Eyes  Lives in [place] in [place] with his wife   He has h/o NSTEMI From 04/20/2022   coronary artery disease s/p CABG with 6 vessels in 2018, hypertension, diabetes mellitus, CKD stage IIIb, HLD And diagnosis of cognitive impairment and depression by Dr. Buck  Acute issues Depression Tried Zoloft and he became weak and sodium was 131 So stopped it Continue to feel weak. Less active  Chest pain H/o CAD Went to ED on 06/21 Work up negative with Negative Troponin  Saw Cardiology on 06/23 no New orders He is now on Imdur   He says he feels Pain in his left side when he eats He is on Prilosec  He is also getting some snacks in night to help with nighttime Sweating And he says it has stopped Also going to work with Therapy Able to maintain in AL Does use Pullups Power chair for Ambulation  Wt Readings from Last 3 Encounters:  03/10/24 164 lb (74.4 kg)  03/04/24 165 lb (74.8 kg)  03/02/24 155 lb (70.3 kg)    Past Medical History:  Diagnosis Date   Cancer (HCC) 09/10/2002   prostate   Chronic kidney disease, stage 3b (HCC)    Coronary artery disease    Depression    Diabetes mellitus type 2 in obese    Dyslipidemia    Esophageal stricture    Monoclonal paraproteinemia 03/03/2013   Palpitations    RBBB (right bundle branch block)     Past Surgical  History:  Procedure Laterality Date   ANKLE SURGERY Left    APPENDECTOMY     CARDIAC CATHETERIZATION     Dr. Swaziland   CATARACT EXTRACTION W/ INTRAOCULAR LENS  IMPLANT, BILATERAL     CORONARY ARTERY BYPASS GRAFT  Oct. 2008   x 5,Dr.Gerhardt   CORONARY/GRAFT ACUTE MI REVASCULARIZATION N/A 04/22/2022   Procedure: Coronary/Graft Acute MI Revascularization;  Surgeon: Swaziland, Peter M, MD;  Location: [facility] INVASIVE CV LAB;  Service: Cardiovascular;  Laterality: N/A;   HERNIA REPAIR Bilateral    LEFT HEART CATH AND CORONARY ANGIOGRAPHY N/A 04/22/2022   Procedure: LEFT HEART CATH AND CORONARY ANGIOGRAPHY;  Surgeon: Swaziland, Peter M, MD;  Location: [facility] INVASIVE CV LAB;  Service: Cardiovascular;  Laterality: N/A;   PROSTATECTOMY  2004   radical   right shoulder      replacement   vein strippping      Allergies  Allergen Reactions   Other Itching     Mepergan Fortis, (meprozine)  = itching (patient disputes this in 2023)    Outpatient Encounter Medications as of 03/10/2024  Medication Sig   aspirin  EC 81 MG tablet Take 1 tablet (81 mg total) by mouth daily. Swallow whole.   bismuth subsalicylate (PEPTO BISMOL) 262 MG chewable tablet  Chew 524 mg by mouth as needed.   Black Pepper-Turmeric 3-500 MG CAPS Take 1 capsule by mouth every Monday, Wednesday, and Friday.   Cyanocobalamin (VITAMIN B-12 PO) Take 5,000 mcg by mouth at bedtime.   docusate sodium  (COLACE) 100 MG capsule Take 100 mg by mouth 2 (two) times daily.   FARXIGA 5 MG TABS tablet Take 5 mg by mouth daily.   fenofibrate  (TRICOR ) 48 MG tablet Take 1 tablet (48 mg total) by mouth daily.   loratadine (CLARITIN) 10 MG tablet Take 10 mg by mouth daily.   meclizine  (ANTIVERT ) 12.5 MG tablet Take 1 tablet (12.5 mg total) by mouth 3 (three) times daily as needed for dizziness.   Multiple Vitamins-Minerals (PRESERVISION AREDS 2) CAPS Take 1 capsule by mouth in the morning and at bedtime.   nitroGLYCERIN  (NITROSTAT ) 0.4 MG SL tablet PLACE 1 TABLET  (0.4 MG TOTAL) UNDER THE TONGUE EVERY 5 (FIVE) MINUTES AS NEEDED.   nitroGLYCERIN  (NITROSTAT ) 0.4 MG SL tablet Place 1 tablet (0.4 mg total) under the tongue every 5 (five) minutes as needed for chest pain.   omeprazole (PRILOSEC) 40 MG capsule Take 40 mg by mouth 2 (two) times daily.   ondansetron  (ZOFRAN ) 4 MG tablet Take 4 mg by mouth every 8 (eight) hours as needed for nausea or vomiting.   polyethylene glycol (MIRALAX  / GLYCOLAX ) 17 g packet Take 17 g by mouth daily as needed for mild constipation, moderate constipation or severe constipation.   simvastatin  (ZOCOR ) 20 MG tablet Take 20 mg by mouth at bedtime.     TRADJENTA  5 MG TABS tablet Take 5 mg by mouth daily.   isosorbide  mononitrate (IMDUR ) 30 MG 24 hr tablet Take 1 tablet (30 mg total) by mouth daily for 6 days. (Patient not taking: Reported on 03/09/2024)   Turmeric (QC TUMERIC COMPLEX PO) Take 3-500 mg by mouth every Monday, Wednesday, and Friday with hemodialysis. (Patient not taking: Reported on 03/09/2024)   No facility-administered encounter medications on file as of 03/10/2024.    Review of Systems:  Review of Systems  Constitutional:  Positive for activity change. Negative for appetite change and unexpected weight change.  HENT: Negative.    Respiratory:  Negative for cough and shortness of breath.   Cardiovascular:  Positive for chest pain. Negative for leg swelling.  Gastrointestinal:  Negative for constipation.  Genitourinary:  Positive for frequency and urgency.  Musculoskeletal:  Positive for gait problem. Negative for arthralgias and myalgias.  Skin: Negative.  Negative for rash.  Neurological:  Negative for dizziness and weakness.  Psychiatric/Behavioral:  Positive for confusion and dysphoric mood. Negative for sleep disturbance.   All other systems reviewed and are negative.   Health Maintenance  Topic Date Due   Zoster Vaccines- Shingrix (2 of 2) 03/17/2013   COVID-19 Vaccine (5 - 2024-25 season) 05/12/2023    Medicare Annual Wellness (AWV)  11/22/2023   INFLUENZA VACCINE  04/10/2024   HEMOGLOBIN A1C  08/03/2024   FOOT EXAM  11/03/2024   OPHTHALMOLOGY EXAM  11/25/2024   DTaP/Tdap/Td (4 - Tdap) 11/05/2033   Pneumococcal Vaccine: 50+ Years  Completed   Hepatitis B Vaccines  Aged Out   HPV VACCINES  Aged Out   Meningococcal B Vaccine  Aged Out    Physical Exam: Vitals:   03/10/24 0829  BP: 124/78  Pulse: 88  Temp: 98.2 F (36.8 C)  SpO2: 98%  Weight: 164 lb (74.4 kg)  Height: 5' 7 (1.702 m)   Body mass index is  25.69 kg/m. Physical Exam Vitals reviewed.  Constitutional:      Appearance: Normal appearance.  HENT:     Head: Normocephalic.     Nose: Nose normal.     Mouth/Throat:     Mouth: Mucous membranes are moist.     Pharynx: Oropharynx is clear.   Eyes:     Pupils: Pupils are equal, round, and reactive to light.    Cardiovascular:     Rate and Rhythm: Normal rate and regular rhythm.     Pulses: Normal pulses.     Heart sounds: No murmur heard. Pulmonary:     Effort: Pulmonary effort is normal. No respiratory distress.     Breath sounds: Normal breath sounds. No rales.  Abdominal:     General: Abdomen is flat. Bowel sounds are normal.     Palpations: Abdomen is soft.   Musculoskeletal:        General: No swelling.     Cervical back: Neck supple.     Comments: Mild swelling Bilateral   Skin:    General: Skin is warm.   Neurological:     General: No focal deficit present.     Mental Status: He is alert and oriented to person, place, and time.   Psychiatric:        Mood and Affect: Mood normal.        Thought Content: Thought content normal.     Labs reviewed: Basic Metabolic Panel: Recent Labs    01/30/24 1920 02/01/24 0501 02/25/24 0000 02/29/24 1948  NA 136 134* 132* 137  K 4.0 3.9 4.1 4.0  CL 105 106 97* 103  CO2 22 21* 20 24  GLUCOSE 193* 118*  --  127*  BUN 30* 21 20 22   CREATININE 1.58* 1.31* 1.4* 1.39*  CALCIUM  8.1* 7.4* 9.3 8.5*  MG   --   --   --  2.2   Liver Function Tests: Recent Labs    12/18/23 1532 01/30/24 1920 02/25/24 0000 02/29/24 1948  AST 11* 24 24 19   ALT 7 13 14 10   ALKPHOS 46 37* 66 36*  BILITOT 0.4 0.4  --  0.6  PROT 5.9* 5.7*  --  5.5*  ALBUMIN 3.6 2.9* 3.8 2.9*   Recent Labs    01/30/24 1920 02/29/24 1948  LIPASE 30 40   No results for input(s): AMMONIA in the last 8760 hours. CBC: Recent Labs    01/30/24 1920 02/01/24 0501 02/25/24 0000 02/29/24 1948  WBC 8.6 7.9 4.0 8.2  NEUTROABS 8.0* 6.5  --  5.5  HGB 12.1* 10.8* 12.3* 10.3*  HCT 37.3* 33.4* 37* 31.6*  MCV 94.0 93.0  --  93.8  PLT 230 199 238 236   Lipid Panel: Recent Labs    11/04/23 0000  CHOL 96  HDL 44  LDLCALC 42  TRIG 52   Lab Results  Component Value Date   HGBA1C 6.9 (H) 02/01/2024    Procedures since last visit: DG Chest Portable 1 View Result Date: 02/29/2024 CLINICAL DATA:  Chest pain EXAM: PORTABLE CHEST 1 VIEW COMPARISON:  01/30/2024 FINDINGS: Cardiac shadow is stable. Postsurgical changes are seen. Aortic calcifications are noted. Mild elevation of the right hemidiaphragm is seen. No bony abnormality is noted. IMPRESSION: No acute abnormality noted. Electronically Signed   By: Oneil Devonshire M.D.   On: 02/29/2024 20:04    Assessment/Plan 1. Weakness (Primary) Therapy   2. Type 2 diabetes mellitus with stage 3b chronic kidney disease, without long-term current use  of insulin  (HCC) On Low dose of Farxiga and Tradjenta  A1C is good Control 7.1 in 6/25  3. Acute depression Start on Wellbutrin XL 150 mg SSRI had some drop in Sodium  4. MCI (mild cognitive impairment) Managing in AL Last MMSE 24/30  5. Stage 3b chronic kidney disease (HCC) Creat stable  6. Mixed hyperlipidemia On Statin   7. Anemia due to stage 3a chronic kidney disease (HCC) Continue to follow for now   8 Atypical Chest pain So far Work up negative Continue on  now he is -on Imdur   Some of his Symptoms can be due to  Reflux Will discontinue Prilosec change it to Protonix   Labs/tests ordered:   Next appt:  04/20/2024

## 2024-03-12 ENCOUNTER — Telehealth: Payer: Self-pay | Admitting: Adult Health

## 2024-03-12 MED ORDER — OMEPRAZOLE 20 MG PO CPDR: 20.0000 mg | DELAYED_RELEASE_CAPSULE | Freq: Every day | ORAL | 3 refills | Status: AC

## 2024-03-12 NOTE — Telephone Encounter (Signed)
 Pt refusing protonix , wellbutrin, and imdur .  He feels these meds cause side effects and he does not want to make changes. He felt worse this am after taking the protonix  with dizziness. I discussed that typically this is not a side effect but he did not agree and wanted to resume prilosec and discontinue wellbutrin and imdur . He would like to discuss with Dr Charlanne on Monday.

## 2024-03-19 ENCOUNTER — Emergency Department (HOSPITAL_COMMUNITY): Admission: EM | Admit: 2024-03-19 | Discharge: 2024-03-19 | Disposition: A | Discharge status: Skilled Nursing Facility

## 2024-03-19 ENCOUNTER — Emergency Department (HOSPITAL_COMMUNITY)

## 2024-03-19 ENCOUNTER — Other Ambulatory Visit: Payer: Self-pay

## 2024-03-19 ENCOUNTER — Encounter (HOSPITAL_COMMUNITY): Payer: Self-pay

## 2024-03-19 DIAGNOSIS — W19XXXA Unspecified fall, initial encounter: Secondary | ICD-10-CM | POA: Diagnosis not present

## 2024-03-19 DIAGNOSIS — I6789 Other cerebrovascular disease: Secondary | ICD-10-CM | POA: Insufficient documentation

## 2024-03-19 DIAGNOSIS — R9082 White matter disease, unspecified: Secondary | ICD-10-CM | POA: Diagnosis not present

## 2024-03-19 DIAGNOSIS — R9431 Abnormal electrocardiogram [ECG] [EKG]: Secondary | ICD-10-CM | POA: Diagnosis not present

## 2024-03-19 DIAGNOSIS — Y92121 Bathroom in nursing home as the place of occurrence of the external cause: Secondary | ICD-10-CM | POA: Insufficient documentation

## 2024-03-19 DIAGNOSIS — M25552 Pain in left hip: Secondary | ICD-10-CM | POA: Diagnosis not present

## 2024-03-19 DIAGNOSIS — Z7982 Long term (current) use of aspirin: Secondary | ICD-10-CM | POA: Insufficient documentation

## 2024-03-19 DIAGNOSIS — S32512A Fracture of superior rim of left pubis, initial encounter for closed fracture: Secondary | ICD-10-CM | POA: Insufficient documentation

## 2024-03-19 DIAGNOSIS — S32599A Other specified fracture of unspecified pubis, initial encounter for closed fracture: Secondary | ICD-10-CM

## 2024-03-19 DIAGNOSIS — S0990XA Unspecified injury of head, initial encounter: Secondary | ICD-10-CM | POA: Diagnosis not present

## 2024-03-19 DIAGNOSIS — I714 Abdominal aortic aneurysm, without rupture, unspecified: Secondary | ICD-10-CM | POA: Diagnosis not present

## 2024-03-19 DIAGNOSIS — S32592A Other specified fracture of left pubis, initial encounter for closed fracture: Secondary | ICD-10-CM | POA: Insufficient documentation

## 2024-03-19 DIAGNOSIS — M51369 Other intervertebral disc degeneration, lumbar region without mention of lumbar back pain or lower extremity pain: Secondary | ICD-10-CM | POA: Diagnosis not present

## 2024-03-19 DIAGNOSIS — S79912A Unspecified injury of left hip, initial encounter: Secondary | ICD-10-CM | POA: Diagnosis present

## 2024-03-19 DIAGNOSIS — K4090 Unilateral inguinal hernia, without obstruction or gangrene, not specified as recurrent: Secondary | ICD-10-CM | POA: Diagnosis not present

## 2024-03-19 LAB — BASIC METABOLIC PANEL WITH GFR
Anion gap: 10 (ref 5–15)
BUN: 29 mg/dL — ABNORMAL HIGH (ref 8–23)
CO2: 24 mmol/L (ref 22–32)
Calcium: 8.9 mg/dL (ref 8.9–10.3)
Chloride: 103 mmol/L (ref 98–111)
Creatinine, Ser: 1.26 mg/dL — ABNORMAL HIGH (ref 0.61–1.24)
GFR, Estimated: 54 mL/min — ABNORMAL LOW (ref >60)
Glucose, Bld: 142 mg/dL — ABNORMAL HIGH (ref 70–99)
Potassium: 4.1 mmol/L (ref 3.5–5.1)
Sodium: 137 mmol/L (ref 135–145)

## 2024-03-19 LAB — CBC
HCT: 37 % — ABNORMAL LOW (ref 39.0–52.0)
Hemoglobin: 11.8 g/dL — ABNORMAL LOW (ref 13.0–17.0)
MCH: 30.3 pg (ref 26.0–34.0)
MCHC: 31.9 g/dL (ref 30.0–36.0)
MCV: 95.1 fL (ref 80.0–100.0)
Platelets: 237 K/uL (ref 150–400)
RBC: 3.89 MIL/uL — ABNORMAL LOW (ref 4.22–5.81)
RDW: 13.4 % (ref 11.5–15.5)
WBC: 9.2 K/uL (ref 4.0–10.5)
nRBC: 0 % (ref 0.0–0.2)

## 2024-03-19 MED ORDER — TRAMADOL HCL 50 MG PO TABS: 50.0000 mg | ORAL_TABLET | Freq: Four times a day (QID) | ORAL | 0 refills | Status: DC | PRN

## 2024-03-19 MED ORDER — ACETAMINOPHEN 500 MG PO TABS
1000.0000 mg | ORAL_TABLET | Freq: Once | ORAL | Status: AC
  Administered 2024-03-19: 1000 mg via ORAL
  Filled 2024-03-19: qty 2

## 2024-03-19 NOTE — ED Provider Notes (Signed)
 [place] EMERGENCY DEPARTMENT AT [facility] Provider Note   CSN: [id number] Arrival date & time: 03/19/24  0848     Patient presents with: Tony Lee is a 89 y.o. male.   This is a 89 year old male presenting emergency department after a fall at his facility.  Mechanical.  Got up from the toilet and his walker was not close enough to him, reached out and fell onto his left hip.  Has continued pain.  No numbness tingling changes in sensation.  Denies loss of consciousness.  Does not think he hit his head.  Has no other pain other than to his left hip.     Fall       Prior to Admission medications   Medication Sig Start Date End Date Taking? Authorizing Provider  traMADol  (ULTRAM ) 50 MG tablet Take 1 tablet (50 mg total) by mouth every 6 (six) hours as needed for up to 7 days. 03/19/24 03/26/24 Yes Arabel Barcenas, Caron PARAS, DO  aspirin  EC 81 MG tablet Take 1 tablet (81 mg total) by mouth daily. Swallow whole. 04/24/22   Kaul, Aseem, MD  bismuth subsalicylate (PEPTO BISMOL) 262 MG chewable tablet Chew 524 mg by mouth as needed.    [provider]  Black Pepper-Turmeric 3-500 MG CAPS Take 1 capsule by mouth every Monday, Wednesday, and Friday.    [provider]  Cyanocobalamin (VITAMIN B-12 PO) Take 5,000 mcg by mouth at bedtime.    [provider]  docusate sodium  (COLACE) 100 MG capsule Take 100 mg by mouth 2 (two) times daily.    [provider]  FARXIGA 5 MG TABS tablet Take 5 mg by mouth daily. 02/08/21   [provider]  fenofibrate  (TRICOR ) 48 MG tablet Take 1 tablet (48 mg total) by mouth daily. 01/07/23   [place], Peter M, MD  loratadine (CLARITIN) 10 MG tablet Take 10 mg by mouth daily.    [provider]  meclizine  (ANTIVERT ) 12.5 MG tablet Take 1 tablet (12.5 mg total) by mouth 3 (three) times daily as needed for dizziness. 07/31/23   Charlanne Fredia CROME, MD  Multiple Vitamins-Minerals (PRESERVISION AREDS 2) CAPS  Take 1 capsule by mouth in the morning and at bedtime.    [provider]  nitroGLYCERIN  (NITROSTAT ) 0.4 MG SL tablet PLACE 1 TABLET (0.4 MG TOTAL) UNDER THE TONGUE EVERY 5 (FIVE) MINUTES AS NEEDED. 06/27/22   [place], Peter M, MD  nitroGLYCERIN  (NITROSTAT ) 0.4 MG SL tablet Place 1 tablet (0.4 mg total) under the tongue every 5 (five) minutes as needed for chest pain. 02/29/24   Myriam Dorn BROCKS, PA  omeprazole  (PRILOSEC) 20 MG capsule Take 1 capsule (20 mg total) by mouth daily. 03/12/24   Darlean Maus, NP  ondansetron  (ZOFRAN ) 4 MG tablet Take 4 mg by mouth every 8 (eight) hours as needed for nausea or vomiting.    [provider]  polyethylene glycol (MIRALAX  / GLYCOLAX ) 17 g packet Take 17 g by mouth daily as needed for mild constipation, moderate constipation or severe constipation.    [provider]  simvastatin  (ZOCOR ) 20 MG tablet Take 20 mg by mouth at bedtime.      [provider]  TRADJENTA  5 MG TABS tablet Take 5 mg by mouth daily. 09/17/15   [provider]    Allergies: Other    Review of Systems  Updated Vital Signs BP 127/74   Pulse 85   Temp 98 F (36.7 C) (Oral)  Resp 17   Ht 5' 7 (1.702 m)   Wt 75 kg   SpO2 94%   BMI 25.90 kg/m   Physical Exam Vitals and nursing note reviewed.  Constitutional:      General: He is not in acute distress.    Appearance: He is not toxic-appearing.  HENT:     Head: Normocephalic.     Nose: Nose normal.     Mouth/Throat:     Mouth: Mucous membranes are moist.  Eyes:     Conjunctiva/sclera: Conjunctivae normal.  Cardiovascular:     Rate and Rhythm: Normal rate and regular rhythm.  Pulmonary:     Effort: Pulmonary effort is normal.     Breath sounds: Normal breath sounds.  Abdominal:     General: Abdomen is flat. There is no distension.     Tenderness: There is no abdominal tenderness. There is no guarding or rebound.  Musculoskeletal:     Comments: Tenderness to the left hip.   5 of 5 plantarflexion dorsiflexion.  2+ DP pulses bilaterally.  Skin:    General: Skin is warm.     Capillary Refill: Capillary refill takes less than 2 seconds.  Neurological:     Mental Status: He is alert and oriented to person, place, and time.  Psychiatric:        Mood and Affect: Mood normal.        Behavior: Behavior normal.     (all labs ordered are listed, but only abnormal results are displayed) Labs Reviewed  CBC - Abnormal; Notable for the following components:      Result Value   RBC 3.89 (*)    Hemoglobin 11.8 (*)    HCT 37.0 (*)    All other components within normal limits  BASIC METABOLIC PANEL WITH GFR - Abnormal; Notable for the following components:   Glucose, Bld 142 (*)    BUN 29 (*)    Creatinine, Ser 1.26 (*)    GFR, Estimated 54 (*)    All other components within normal limits    EKG: None  Radiology: CT PELVIS WO CONTRAST Result Date: 03/19/2024 CLINICAL DATA:  Hip trauma.  Fracture suspected. EXAM: CT PELVIS WITHOUT CONTRAST TECHNIQUE: Multidetector CT imaging of the pelvis was performed following the standard protocol without intravenous contrast. RADIATION DOSE REDUCTION: This exam was performed according to the departmental dose-optimization program which includes automated exposure control, adjustment of the mA and/or kV according to patient size and/or use of iterative reconstruction technique. COMPARISON:  Left hip radiograph dated 03/19/2024. CT abdomen pelvis dated 01/31/2024. FINDINGS: Urinary Tract:  No abnormality visualized. Bowel:  Unremarkable visualized pelvic bowel loops. Vascular/Lymphatic: Advanced atherosclerotic calcification. Partially visualized aneurysm of the distal abdominal aorta better evaluated on the recent CT. Follow-up as per recommendation of prior CT. No pelvic adenopathy. Reproductive:  Prostatectomy. Other:  Small fat containing right inguinal hernia. Musculoskeletal: There is osteopenia with degenerative changes of the  spine. Nondisplaced fracture of the left anterior superior pubic ramus adjacent to the symphysis pubis and mildly displaced fracture of the left inferior pubic ramus. No femoral neck fracture. No dislocation. There is subcutaneous contusion and hematoma of the lateral left hip. IMPRESSION: 1. Fractures of the left superior and inferior pubic rami. 2. Subcutaneous contusion and hematoma of the lateral left hip. 3.  Aortic Atherosclerosis (ICD10-I70.0). Electronically Signed   By: Vanetta Chou M.D.   On: 03/19/2024 10:45   CT Head Wo Contrast Result Date: 03/19/2024 CLINICAL DATA:  Head trauma.  Fall in bathroom EXAM: CT HEAD WITHOUT CONTRAST TECHNIQUE: Contiguous axial images were obtained from the base of the skull through the vertex without intravenous contrast. RADIATION DOSE REDUCTION: This exam was performed according to the departmental dose-optimization program which includes automated exposure control, adjustment of the mA and/or kV according to patient size and/or use of iterative reconstruction technique. COMPARISON:  None Available. FINDINGS: Brain: No acute intracranial hemorrhage. No focal mass lesion. No CT evidence of acute infarction. No midline shift or mass effect. No hydrocephalus. Basilar cisterns are patent. There are periventricular and subcortical white matter hypodensities. Generalized cortical atrophy. Vascular: No hyperdense vessel or unexpected calcification. Skull: Normal. Negative for fracture or focal lesion. Sinuses/Orbits: Paranasal sinuses and mastoid air cells are clear. Orbits are clear. Other: None. IMPRESSION: 1. No acute intracranial findings. 2. Atrophy and white matter microvascular disease. Electronically Signed   By: Jackquline Boxer M.D.   On: 03/19/2024 09:42   DG Hip [place] or [place] Pelvis 1 View Left Result Date: 03/19/2024 CLINICAL DATA:  Hip pain. EXAM: DG HIP (WITH OR WITHOUT PELVIS) 1V PORT LEFT COMPARISON:  None Available. FINDINGS: Frontal pelvis shows  degenerative disc disease lower lumbar spine. SI joints and symphysis pubis unremarkable. No pubic ramus fracture. AP in limited frog-leg lateral views of the left hip show no evidence for femoral neck fracture. No femoral head dislocation. IMPRESSION: 1. No acute bony findings. 2. Degenerative disc disease lower lumbar spine. Electronically Signed   By: Camellia Candle M.D.   On: 03/19/2024 09:14     Procedures   Medications Ordered in the ED  acetaminophen  (TYLENOL ) tablet 1,000 mg (1,000 mg Oral Given 03/19/24 0936)    Clinical Course as of 03/19/24 1525  Thu Mar 19, 2024  1012 CT Head Wo Contrast IMPRESSION: 1. No acute intracranial findings. 2. Atrophy and white matter microvascular disease.   Electronically Signed   By: Jackquline Boxer M.D.   On: 03/19/2024 09:42     [TY]  1052 CT PELVIS WO CONTRAST IMPRESSION: 1. Fractures of the left superior and inferior pubic rami. 2. Subcutaneous contusion and hematoma of the lateral left hip. 3.  Aortic Atherosclerosis (ICD10-I70.0).   Electronically Signed   By: Vanetta Chou M.D.   On: 03/19/2024 10:45     [TY]  1221 Discussed with our social work who states that his facility is able to do their own physical therapy evaluation to be moved over to rehab.  FL 2 form has been signed.  Both patient and daughter are requesting discharge back to facility.  Will discharge in stable condition. [TY]    Clinical Course User Index [TY] Neysa Caron PARAS, DO                                 Medical Decision Making 89 year old male presenting emergency department after fall with left hip pain.  He is afebrile vital signs reassuring.  Deformity to the left hip with tenderness.  Will get x-ray evaluate for fracture versus dislocation.  Will get CT head given his advanced age and evaluate for intracranial trauma.  Otherwise clinically well-appearing.  Alert and orient x 3.  No other bony tenderness or obvious trauma.  Amount and/or  Complexity of Data Reviewed Independent Historian:     Details: Daughter presented to bedside and states that father's been less and less ambulatory and using wheelchair more more. External Data Reviewed:  Details: Not on blood thinner Labs: ordered. Decision-making details documented in ED Course.    Details: Minor anemia, unlikely contributory to fall.  No metabolic derangements.  Appears to be near baseline kidney function Radiology: ordered and independent interpretation performed. Decision-making details documented in ED Course.    Details: I do not appreciate obvious fracture on x-ray.  Given patient's pain will get CT scan ECG/medicine tests: ordered.  Risk OTC drugs. Prescription drug management. Decision regarding hospitalization. Diagnosis or treatment significantly limited by social determinants of health. Risk Details: Resides at facility       Final diagnoses:  Closed stable fracture of multiple pubic rami [facility])    ED Discharge Orders          Ordered    traMADol  (ULTRAM ) 50 MG tablet  Every 6 hours PRN        03/19/24 1224               Neysa Caron PARAS, DO 03/19/24 1525

## 2024-03-19 NOTE — [place] FL2 (Signed)
 [place]  MEDICAID FL2 LEVEL OF CARE FORM     IDENTIFICATION  Patient Name: Tony Lee Birthdate: [birth year] Sex: male Admission Date (Current Location): 03/19/2024  [facility] and [place] Number:  Producer, television/film/video and Address:  [facility],  [address], [place] [address]      Provider Number: [id number]  Attending Physician Name and Address:  Neysa Caron PARAS, DO  Relative Name and Phone Number:  Schnelle,doug (Son)  [phone] [facility])    Current Level of Care: Hospital Recommended Level of Care: Skilled Nursing Facility Prior Approval Number:    Date Approved/Denied:   PASRR Number:    Discharge Plan: SNF    Current Diagnoses: Patient Active Problem List   Diagnosis Date Noted   Generalized weakness 02/01/2024   Gastroenteritis 01/31/2024   Sepsis (HCC) 01/31/2024   Intractable nausea and vomiting 01/30/2024   Non-ST elevation (NSTEMI) myocardial infarction [facility])    Unstable angina (HCC) 04/20/2022   Chest pain 04/20/2022   Type 2 diabetes mellitus with obesity (HCC) 08/27/2014   Monoclonal paraproteinemia 03/03/2013   Hereditary and idiopathic peripheral neuropathy 01/29/2013   Ataxia 01/29/2013   Coronary artery disease    Cancer (HCC)    RBBB (right bundle branch block)    Palpitations    Dyslipidemia     Orientation RESPIRATION BLADDER Height & Weight     Self, Time, Place, Situation  Normal Continent Weight: 165 lb 5.5 oz (75 kg) Height:  5' 7 (170.2 cm)  BEHAVIORAL SYMPTOMS/MOOD NEUROLOGICAL BOWEL NUTRITION STATUS      Continent Diet (Regular)  AMBULATORY STATUS COMMUNICATION OF NEEDS Skin   Limited Assist Verbally Normal                       Personal Care Assistance Level of Assistance  Bathing, Feeding, Dressing Bathing Assistance: Limited assistance Feeding assistance: Independent Dressing Assistance: Limited assistance     Functional Limitations Info  Sight, Speech, Hearing Sight Info: Adequate Hearing  Info: Adequate Speech Info: Adequate    SPECIAL CARE FACTORS FREQUENCY  PT (By licensed PT), OT (By licensed OT)     PT Frequency: x5/week OT Frequency: x5/week            Contractures Contractures Info: Not present    Additional Factors Info  Code Status, Allergies Code Status Info: Full Allergies Info: Mepergan Fortis, (meprozine)  = itching (patient disputes this in 2023)           Current Medications (03/19/2024):  This is the current hospital active medication list No current facility-administered medications for this encounter.   Current Outpatient Medications  Medication Sig Dispense Refill   aspirin  EC 81 MG tablet Take 1 tablet (81 mg total) by mouth daily. Swallow whole. 30 tablet 12   bismuth subsalicylate (PEPTO BISMOL) 262 MG chewable tablet Chew 524 mg by mouth as needed.     Black Pepper-Turmeric 3-500 MG CAPS Take 1 capsule by mouth every Monday, Wednesday, and Friday.     Cyanocobalamin (VITAMIN B-12 PO) Take 5,000 mcg by mouth at bedtime.     docusate sodium  (COLACE) 100 MG capsule Take 100 mg by mouth 2 (two) times daily.     FARXIGA 5 MG TABS tablet Take 5 mg by mouth daily.     fenofibrate  (TRICOR ) 48 MG tablet Take 1 tablet (48 mg total) by mouth daily. 90 tablet 3   loratadine (CLARITIN) 10 MG tablet Take 10 mg by mouth daily.  meclizine  (ANTIVERT ) 12.5 MG tablet Take 1 tablet (12.5 mg total) by mouth 3 (three) times daily as needed for dizziness.     Multiple Vitamins-Minerals (PRESERVISION AREDS 2) CAPS Take 1 capsule by mouth in the morning and at bedtime.     nitroGLYCERIN  (NITROSTAT ) 0.4 MG SL tablet PLACE 1 TABLET (0.4 MG TOTAL) UNDER THE TONGUE EVERY 5 (FIVE) MINUTES AS NEEDED. 25 tablet 6   nitroGLYCERIN  (NITROSTAT ) 0.4 MG SL tablet Place 1 tablet (0.4 mg total) under the tongue every 5 (five) minutes as needed for chest pain. 30 tablet 0   omeprazole  (PRILOSEC) 20 MG capsule Take 1 capsule (20 mg total) by mouth daily. 30 capsule 3    ondansetron  (ZOFRAN ) 4 MG tablet Take 4 mg by mouth every 8 (eight) hours as needed for nausea or vomiting.     polyethylene glycol (MIRALAX  / GLYCOLAX ) 17 g packet Take 17 g by mouth daily as needed for mild constipation, moderate constipation or severe constipation.     simvastatin  (ZOCOR ) 20 MG tablet Take 20 mg by mouth at bedtime.       TRADJENTA  5 MG TABS tablet Take 5 mg by mouth daily.  6     Discharge Medications: Please see discharge summary for a list of discharge medications.  Relevant Imaging Results:  Relevant Lab Results:   Additional Information SSN:[id number]  Kari JONETTA Daisy, LCSW

## 2024-03-19 NOTE — ED Notes (Signed)
 Called [facility] to let staff know that pt was discharged and transported to their facility

## 2024-03-19 NOTE — Progress Notes (Signed)
 TOC consulted for SNF. CSW received secure chat from EDP informing family wishes for pt to return to [facility] to complete rehab. CSW spoke with Kristeen Hales, [phone] who reports she only needs an FL2 sent in the hub without a PASRR. Cierra also informed their PT team will evaluate upon pt's return. EDP notified via secure chat and attempted to notify daughter via phone.

## 2024-03-19 NOTE — ED Triage Notes (Signed)
 Patient BIB [facility] from [facility]. Had a fall in the bathroom. Got up from the toilet and the walker was not close enough so he fell onto left hip. Has left hip pain. Able to get self up back in chair. Unable to weight bear. Hematoma on left hip. No blood thinners.

## 2024-03-19 NOTE — ED Notes (Signed)
 [facility] called and they said patient is 2nd on the list.

## 2024-03-19 NOTE — ED Notes (Signed)
 [facility] called for transport

## 2024-03-19 NOTE — Discharge Instructions (Addendum)
 May take over-the-counter Tylenol  every 8 hours for baseline pain control.  We also prescribed you tramadol  which can take for breakthrough pain.  Please follow-up with the orthopedic doctor in 1 week.  You may weight-bear as tolerated.  Return if develop fevers, chills, chest pain, shortness of breath abdominal pain, or any new or worsening symptoms that are concerning to you.

## 2024-03-20 ENCOUNTER — Non-Acute Institutional Stay (SKILLED_NURSING_FACILITY): Payer: Self-pay | Admitting: Adult Health

## 2024-03-20 ENCOUNTER — Encounter: Payer: Self-pay | Admitting: Adult Health

## 2024-03-20 DIAGNOSIS — E785 Hyperlipidemia, unspecified: Secondary | ICD-10-CM

## 2024-03-20 DIAGNOSIS — S32810S Multiple fractures of pelvis with stable disruption of pelvic ring, sequela: Secondary | ICD-10-CM | POA: Diagnosis not present

## 2024-03-20 DIAGNOSIS — E1122 Type 2 diabetes mellitus with diabetic chronic kidney disease: Secondary | ICD-10-CM | POA: Diagnosis not present

## 2024-03-20 DIAGNOSIS — I4510 Unspecified right bundle-branch block: Secondary | ICD-10-CM | POA: Diagnosis not present

## 2024-03-20 DIAGNOSIS — F32A Depression, unspecified: Secondary | ICD-10-CM

## 2024-03-20 DIAGNOSIS — R41841 Cognitive communication deficit: Secondary | ICD-10-CM | POA: Diagnosis not present

## 2024-03-20 DIAGNOSIS — S3282XA Multiple fractures of pelvis without disruption of pelvic ring, initial encounter for closed fracture: Secondary | ICD-10-CM

## 2024-03-20 DIAGNOSIS — M25552 Pain in left hip: Secondary | ICD-10-CM | POA: Diagnosis not present

## 2024-03-20 DIAGNOSIS — N1832 Chronic kidney disease, stage 3b: Secondary | ICD-10-CM

## 2024-03-20 DIAGNOSIS — K5901 Slow transit constipation: Secondary | ICD-10-CM | POA: Diagnosis not present

## 2024-03-20 DIAGNOSIS — R42 Dizziness and giddiness: Secondary | ICD-10-CM

## 2024-03-20 DIAGNOSIS — M6281 Muscle weakness (generalized): Secondary | ICD-10-CM | POA: Diagnosis not present

## 2024-03-20 DIAGNOSIS — G3184 Mild cognitive impairment, so stated: Secondary | ICD-10-CM

## 2024-03-20 DIAGNOSIS — R531 Weakness: Secondary | ICD-10-CM | POA: Diagnosis not present

## 2024-03-20 DIAGNOSIS — R0789 Other chest pain: Secondary | ICD-10-CM | POA: Diagnosis not present

## 2024-03-20 DIAGNOSIS — I2510 Atherosclerotic heart disease of native coronary artery without angina pectoris: Secondary | ICD-10-CM | POA: Diagnosis not present

## 2024-03-20 DIAGNOSIS — R2689 Other abnormalities of gait and mobility: Secondary | ICD-10-CM | POA: Diagnosis not present

## 2024-03-20 DIAGNOSIS — Z9181 History of falling: Secondary | ICD-10-CM | POA: Diagnosis not present

## 2024-03-20 MED ORDER — TRAMADOL HCL 50 MG PO TABS: 50.0000 mg | ORAL_TABLET | Freq: Four times a day (QID) | ORAL | 0 refills | Status: AC | PRN

## 2024-03-20 NOTE — Progress Notes (Signed)
 Location:  [facility] Room Number: 152-P Place of Service:  SNF [phone]) Provider:  Darlean Maus, NP  Charlanne Fredia CROME, MD  Patient Care Team: Charlanne Fredia CROME, MD as PCP - General (Internal Medicine) [place], Peter M, MD as PCP - Cardiology (Cardiology) Tat, Asberry RAMAN, DO as Attending Physician (Neurology) [place], Peter M, MD as Attending Physician (Cardiology) Twana Jeneal DASEN, MD (Hematology and Oncology) Rosalie Kitchens, MD as Attending Physician (Gastroenterology)  Extended Emergency Contact Information Primary Emergency Contact: Bartell,doug Mobile Phone: [phone] Relation: Son Secondary Emergency Contact: [facility] Phone: [phone] Relation: Daughter  Code Status:  DNR Goals of care: Advanced Directive information    03/19/2024    8:52 AM  Advanced Directives  Does Patient Have a Medical Advance Directive? No  Would patient like information on creating a medical advance directive? No - Patient declined     Chief Complaint  Patient presents with   Hospitalization Follow-up    Acute visit. Need for Shingles, Covid and AWV.    HPI:  Seen s/p ER visit on 03/19/24 due to a fall CT of the pelvis showed fractures of the left superior and inferior rami CT of the head with no acute findings, atrophy and microvascular disease noted.   [place] in [place] with [place] but was admitted to skilled rehab for therapy. Pain is controlled with tramadol   He reports he feels constipated. Does not remember last BM, asking for prune juice. NO pain or vomiting.  Felt dizzy this am and took meclizine  with resolution of symptoms.  This has been an ongoing symptom. BP was normal. Orthostatics normal in the past.  Unremarkable CBC BMP in ED Has had some progressive confusion, concern also for depression diagnosed by Dr Buck.  Tried zoloft but became weak, NA 131 so it was discontinued Ordered wellbutrin but he refused it 03/12/24 Intermittent chest  pain h/o CAD Seen in the ED 02/29/24 with negative troponin and work up Started on Imdur  but then refused it on 7/3 also  12 lead EKG June 21 RBBB 1st degree AVB, SR no ST abnormality  DM II: on tradjenta  and farxiga  Lab Results  Component Value Date   HGBA1C 6.9 (H) 02/01/2024  `  I found him to be put together and pleasant this morning. Much more alert and cooperative. Ready to work with therapy. Wants to go back to [place]. Has a walker and a motorized chair. Some incontinence. Past Medical History:  Diagnosis Date   Cancer (HCC) 09/10/2002   prostate   Chronic kidney disease, stage 3b (HCC)    Coronary artery disease    Depression    Diabetes mellitus type 2 in obese    Dyslipidemia    Esophageal stricture    Monoclonal paraproteinemia 03/03/2013   Palpitations    RBBB (right bundle branch block)    Past Surgical History:  Procedure Laterality Date   ANKLE SURGERY Left    APPENDECTOMY     CARDIAC CATHETERIZATION     Dr. [place]   CATARACT EXTRACTION W/ INTRAOCULAR LENS  IMPLANT, BILATERAL     CORONARY ARTERY BYPASS GRAFT  Oct. 2008   x 5,Dr.Gerhardt   CORONARY/GRAFT ACUTE MI REVASCULARIZATION N/A 04/22/2022   Procedure: Coronary/Graft Acute MI Revascularization;  Surgeon: [place]az[place], Peter M, MD;  Location: [facility] INVASIVE CV LAB;  Service: Cardiovascular;  Laterality: N/A;   HERNIA REPAIR Bilateral    LEFT HEART CATH AND CORONARY ANGIOGRAPHY N/A 04/22/2022   Procedure: LEFT HEART CATH AND  CORONARY ANGIOGRAPHY;  Surgeon: [place], Peter M, MD;  Location: [facility] INVASIVE CV LAB;  Service: Cardiovascular;  Laterality: N/A;   PROSTATECTOMY  2004   radical   right shoulder      replacement   vein strippping      Allergies  Allergen Reactions   Other Itching     Mepergan Fortis, (meprozine)  = itching (patient disputes this in 2023)    Outpatient Encounter Medications as of 03/20/2024  Medication Sig   acetaminophen  (TYLENOL ) 325 MG tablet Take 650 mg by mouth every 4 (four) hours as  needed for fever or headache.   aspirin  EC 81 MG tablet Take 1 tablet (81 mg total) by mouth daily. Swallow whole.   bismuth subsalicylate (PEPTO BISMOL) 262 MG chewable tablet Chew 524 mg by mouth as needed.   Black Pepper-Turmeric 3-500 MG CAPS Take 1 capsule by mouth every Monday, Wednesday, and Friday.   Cyanocobalamin (VITAMIN B-12 PO) Take 5,000 mcg by mouth at bedtime.   docusate sodium  (COLACE) 100 MG capsule Take 100 mg by mouth 2 (two) times daily.   FARXIGA 5 MG TABS tablet Take 5 mg by mouth daily.   fenofibrate  (TRICOR ) 48 MG tablet Take 1 tablet (48 mg total) by mouth daily.   loratadine (CLARITIN) 10 MG tablet Take 10 mg by mouth daily.   meclizine  (ANTIVERT ) 12.5 MG tablet Take 1 tablet (12.5 mg total) by mouth 3 (three) times daily as needed for dizziness.   Multiple Vitamins-Minerals (PRESERVISION AREDS 2) CAPS Take 1 capsule by mouth in the morning and at bedtime.   nitroGLYCERIN  (NITROSTAT ) 0.4 MG SL tablet Place 1 tablet (0.4 mg total) under the tongue every 5 (five) minutes as needed for chest pain.   omeprazole  (PRILOSEC) 20 MG capsule Take 1 capsule (20 mg total) by mouth daily.   ondansetron  (ZOFRAN ) 4 MG tablet Take 4 mg by mouth every 8 (eight) hours as needed for nausea or vomiting.   polyethylene glycol (MIRALAX  / GLYCOLAX ) 17 g packet Take 17 g by mouth daily as needed for mild constipation, moderate constipation or severe constipation.   simvastatin  (ZOCOR ) 20 MG tablet Take 20 mg by mouth at bedtime.     TRADJENTA  5 MG TABS tablet Take 5 mg by mouth daily.   traMADol  (ULTRAM ) 50 MG tablet Take 1 tablet (50 mg total) by mouth every 6 (six) hours as needed for up to 7 days.   nitroGLYCERIN  (NITROSTAT ) 0.4 MG SL tablet PLACE 1 TABLET (0.4 MG TOTAL) UNDER THE TONGUE EVERY 5 (FIVE) MINUTES AS NEEDED. (Patient not taking: Reported on 03/20/2024)   No facility-administered encounter medications on file as of 03/20/2024.    Review of Systems  Immunization History   Administered Date(s) Administered   Influenza, Quadrivalent, Recombinant, Inj, Pf 05/06/2018, 06/05/2019, 06/21/2020, 06/05/2021, 05/30/2023   Influenza-Unspecified 06/05/2021, 07/02/2023   Moderna Sars-Covid-2 Vaccination 09/22/2019, 10/20/2019, 07/26/2020, 06/27/2021   PNEUMOCOCCAL CONJUGATE-20 06/29/2014   Pneumococcal Conjugate-13 06/21/2013   Pneumococcal-Unspecified 08/12/2013   Td 07/29/2009, 07/29/2013   Tetanus 11/06/2023   Zoster Recombinant(Shingrix) 01/20/2013   Pertinent  Health Maintenance Due  Topic Date Due   INFLUENZA VACCINE  04/10/2024   HEMOGLOBIN A1C  08/03/2024   FOOT EXAM  11/03/2024   OPHTHALMOLOGY EXAM  11/25/2024      04/21/2022    9:00 AM 04/22/2022    7:45 AM 04/22/2022   10:00 PM 04/23/2022   10:00 AM 07/23/2023    2:39 PM  Fall Risk  Falls in the past  year?     0  Was there an injury with Fall?     0  Fall Risk Category Calculator     0  (RETIRED) Patient Fall Risk Level High fall risk  High fall risk  High fall risk  High fall risk    Patient at Risk for Falls Due to     Impaired balance/gait;Impaired mobility  Fall risk Follow up     Falls evaluation completed     Data saved with a previous flowsheet row definition   Functional Status Survey:    Vitals:   03/20/24 0806  BP: 124/76  Temp: 97.9 F (36.6 C)  SpO2: 95%  Weight: 160 lb (72.6 kg)  Height: 5' 7 (1.702 m)   Body mass index is 25.06 kg/m. Physical Exam  Labs reviewed: Recent Labs    02/01/24 0501 02/25/24 0000 02/29/24 1948 03/19/24 0935  NA 134* 132* 137 137  K 3.9 4.1 4.0 4.1  CL 106 97* 103 103  CO2 21* 20 24 24   GLUCOSE 118*  --  127* 142*  BUN 21 20 22  29*  CREATININE 1.31* 1.4* 1.39* 1.26*  CALCIUM  7.4* 9.3 8.5* 8.9  MG  --   --  2.2  --    Recent Labs    12/18/23 1532 01/30/24 1920 02/25/24 0000 02/29/24 1948  AST 11* 24 24 19   ALT 7 13 14 10   ALKPHOS 46 37* 66 36*  BILITOT 0.4 0.4  --  0.6  PROT 5.9* 5.7*  --  5.5*  ALBUMIN 3.6 2.9* 3.8 2.9*    Recent Labs    01/30/24 1920 02/01/24 0501 02/06/24 0000 02/25/24 0000 02/29/24 1948 03/19/24 0935  WBC 8.6 7.9   < > 4.0 8.2 9.2  NEUTROABS 8.0* 6.5  --   --  5.5  --   HGB 12.1* 10.8*   < > 12.3* 10.3* 11.8*  HCT 37.3* 33.4*   < > 37* 31.6* 37.0*  MCV 94.0 93.0  --   --  93.8 95.1  PLT 230 199   < > 238 236 237   < > = values in this interval not displayed.   Lab Results  Component Value Date   TSH 3.233 04/21/2022   Lab Results  Component Value Date   HGBA1C 6.9 (H) 02/01/2024   Lab Results  Component Value Date   CHOL 96 11/04/2023   HDL 44 11/04/2023   LDLCALC 42 11/04/2023   TRIG 52 11/04/2023   CHOLHDL 2.8 04/21/2022    Significant Diagnostic Results in last 30 days:  CT PELVIS WO CONTRAST Result Date: 03/19/2024 CLINICAL DATA:  Hip trauma.  Fracture suspected. EXAM: CT PELVIS WITHOUT CONTRAST TECHNIQUE: Multidetector CT imaging of the pelvis was performed following the standard protocol without intravenous contrast. RADIATION DOSE REDUCTION: This exam was performed according to the departmental dose-optimization program which includes automated exposure control, adjustment of the mA and/or kV according to patient size and/or use of iterative reconstruction technique. COMPARISON:  Left hip radiograph dated 03/19/2024. CT abdomen pelvis dated 01/31/2024. FINDINGS: Urinary Tract:  No abnormality visualized. Bowel:  Unremarkable visualized pelvic bowel loops. Vascular/Lymphatic: Advanced atherosclerotic calcification. Partially visualized aneurysm of the distal abdominal aorta better evaluated on the recent CT. Follow-up as per recommendation of prior CT. No pelvic adenopathy. Reproductive:  Prostatectomy. Other:  Small fat containing right inguinal hernia. Musculoskeletal: There is osteopenia with degenerative changes of the spine. Nondisplaced fracture of the left anterior superior pubic ramus adjacent to the symphysis  pubis and mildly displaced fracture of the left  inferior pubic ramus. No femoral neck fracture. No dislocation. There is subcutaneous contusion and hematoma of the lateral left hip. IMPRESSION: 1. Fractures of the left superior and inferior pubic rami. 2. Subcutaneous contusion and hematoma of the lateral left hip. 3.  Aortic Atherosclerosis (ICD10-I70.0). Electronically Signed   By: Vanetta Chou M.D.   On: 03/19/2024 10:45   CT Head Wo Contrast Result Date: 03/19/2024 CLINICAL DATA:  Head trauma.  Fall in bathroom EXAM: CT HEAD WITHOUT CONTRAST TECHNIQUE: Contiguous axial images were obtained from the base of the skull through the vertex without intravenous contrast. RADIATION DOSE REDUCTION: This exam was performed according to the departmental dose-optimization program which includes automated exposure control, adjustment of the mA and/or kV according to patient size and/or use of iterative reconstruction technique. COMPARISON:  None Available. FINDINGS: Brain: No acute intracranial hemorrhage. No focal mass lesion. No CT evidence of acute infarction. No midline shift or mass effect. No hydrocephalus. Basilar cisterns are patent. There are periventricular and subcortical white matter hypodensities. Generalized cortical atrophy. Vascular: No hyperdense vessel or unexpected calcification. Skull: Normal. Negative for fracture or focal lesion. Sinuses/Orbits: Paranasal sinuses and mastoid air cells are clear. Orbits are clear. Other: None. IMPRESSION: 1. No acute intracranial findings. 2. Atrophy and white matter microvascular disease. Electronically Signed   By: Jackquline Boxer M.D.   On: 03/19/2024 09:42   DG Hip Port [place] or [place] Pelvis 1 View Left Result Date: 03/19/2024 CLINICAL DATA:  Hip pain. EXAM: DG HIP (WITH OR WITHOUT PELVIS) 1V PORT LEFT COMPARISON:  None Available. FINDINGS: Frontal pelvis shows degenerative disc disease lower lumbar spine. SI joints and symphysis pubis unremarkable. No pubic ramus fracture. AP in limited frog-leg  lateral views of the left hip show no evidence for femoral neck fracture. No femoral head dislocation. IMPRESSION: 1. No acute bony findings. 2. Degenerative disc disease lower lumbar spine. Electronically Signed   By: Camellia Candle M.D.   On: 03/19/2024 09:14   DG Chest Portable 1 View Result Date: 02/29/2024 CLINICAL DATA:  Chest pain EXAM: PORTABLE CHEST 1 VIEW COMPARISON:  01/30/2024 FINDINGS: Cardiac shadow is stable. Postsurgical changes are seen. Aortic calcifications are noted. Mild elevation of the right hemidiaphragm is seen. No bony abnormality is noted. IMPRESSION: No acute abnormality noted. Electronically Signed   By: Oneil Devonshire M.D.   On: 02/29/2024 20:04    Assessment/Plan  1. Multiple closed fractures of pelvis without disruption of pelvic ring, initial encounter (HCC) (Primary) Therapy Pain control with tramadol   WBAT F/U with ortho  2. Dizziness Normal neuro exam Labs ok Bp ok Ongoing issue  ?farxiga Will d/c and monitor   3. MCI (mild cognitive impairment) Progressing Needs repeat MMSE   4. Acute depression Declined wellbutrin before Much better spirits today   5. Atypical chest pain No current issues Not on imdur  per pt request On prilosec  6. Slow transit constipation Requesting prune juice If no improvement schedule miralax   7. Type 2 diabetes mellitus with stage 3b chronic kidney disease, without long-term current use of insulin  (HCC) D/c farxiga  Monitored CBGs Continue tradjenta   8. RBBB (right bundle branch block) noted  9. Dyslipidemia Lab Results  Component Value Date   LDLCALC 42 11/04/2023  On zocor  and fenofibrate   10. Coronary artery disease involving native coronary artery of native heart without angina pectoris On asa Follows with cardiology   11. Stage 3b chronic kidney disease (HCC) Continue to  periodically monitor BMP and avoid nephrotoxic agents

## 2024-03-21 DIAGNOSIS — R8290 Unspecified abnormal findings in urine: Secondary | ICD-10-CM | POA: Diagnosis not present

## 2024-03-21 DIAGNOSIS — R4182 Altered mental status, unspecified: Secondary | ICD-10-CM | POA: Diagnosis not present

## 2024-03-23 ENCOUNTER — Encounter: Payer: Self-pay | Admitting: Internal Medicine

## 2024-03-23 ENCOUNTER — Non-Acute Institutional Stay (SKILLED_NURSING_FACILITY): Payer: Self-pay | Admitting: Internal Medicine

## 2024-03-23 DIAGNOSIS — E1122 Type 2 diabetes mellitus with diabetic chronic kidney disease: Secondary | ICD-10-CM | POA: Diagnosis not present

## 2024-03-23 DIAGNOSIS — R0789 Other chest pain: Secondary | ICD-10-CM | POA: Diagnosis not present

## 2024-03-23 DIAGNOSIS — S3282XA Multiple fractures of pelvis without disruption of pelvic ring, initial encounter for closed fracture: Secondary | ICD-10-CM | POA: Diagnosis not present

## 2024-03-23 DIAGNOSIS — E785 Hyperlipidemia, unspecified: Secondary | ICD-10-CM

## 2024-03-23 DIAGNOSIS — R42 Dizziness and giddiness: Secondary | ICD-10-CM | POA: Diagnosis not present

## 2024-03-23 DIAGNOSIS — F32A Depression, unspecified: Secondary | ICD-10-CM | POA: Diagnosis not present

## 2024-03-23 DIAGNOSIS — G3184 Mild cognitive impairment, so stated: Secondary | ICD-10-CM

## 2024-03-23 DIAGNOSIS — I2510 Atherosclerotic heart disease of native coronary artery without angina pectoris: Secondary | ICD-10-CM | POA: Diagnosis not present

## 2024-03-23 DIAGNOSIS — S32592A Other specified fracture of left pubis, initial encounter for closed fracture: Secondary | ICD-10-CM | POA: Diagnosis not present

## 2024-03-23 DIAGNOSIS — R531 Weakness: Secondary | ICD-10-CM | POA: Diagnosis not present

## 2024-03-23 DIAGNOSIS — N1832 Chronic kidney disease, stage 3b: Secondary | ICD-10-CM

## 2024-03-23 DIAGNOSIS — S32512A Fracture of superior rim of left pubis, initial encounter for closed fracture: Secondary | ICD-10-CM | POA: Diagnosis not present

## 2024-03-23 NOTE — Progress Notes (Signed)
 Provider:  Charlanne Fredia CROME, MD  Location:  [facility] Room Number: 152 P Place of Service:  SNF ([phone])  PCP: Charlanne Fredia CROME, MD Patient Care Team: Charlanne Fredia CROME, MD as PCP - General (Internal Medicine) [place]az[place], Peter M, MD as PCP - Cardiology (Cardiology) Tat, Asberry RAMAN, DO as Attending Physician (Neurology) [place], Peter M, MD as Attending Physician (Cardiology) Twana Jeneal DASEN, MD (Hematology and Oncology) Rosalie Kitchens, MD as Attending Physician (Gastroenterology)  Extended Emergency Contact Information Primary Emergency Contact: Rapley,doug Mobile Phone: [phone] Relation: Son Secondary Emergency Contact: Mcgaugh,Beth Mobile Phone: [phone] Relation: Daughter  Code Status: DNR Goals of Care: Advanced Directive information    03/19/2024    8:52 AM  Advanced Directives  Does Patient Have a Medical Advance Directive? No  Would patient like information on creating a medical advance directive? No - Patient declined      Chief Complaint  Patient presents with   New Admit To SNF    Discuss the need for Shingles, Covid and AWV    HPI: Patient is a 89 y.o. male seen today for admission to Rehab  Patient fell in her Room in [place] in  on 03/19/2024 and was found on CT  to have  Fractures of the left superior and inferior pubic rami.  Subcutaneous contusion and hematoma of the lateral left hip  Patient was admitted to Rehab for therapy and Pain control Pain seems to be controlled with Tylenol   He has been more confused for past few months Worse since been in Rehab Urine Done was negative for any Infection Usually able to Operate Power Chair He was also taken off Farixga to see if it will help with his Dizziness and falls He was more confused today Did not have any complains No Behaviors  He has h/o NSTEMI From 04/20/2022   coronary artery disease s/p CABG with 6 vessels in 2018, hypertension, diabetes mellitus, CKD stage IIIb,  HLD And diagnosis of cognitive impairment and depression by Dr. Buck   Past Medical History:  Diagnosis Date   Cancer (HCC) 09/10/2002   prostate   Chronic kidney disease, stage 3b (HCC)    Coronary artery disease    Depression    Diabetes mellitus type 2 in obese    Dyslipidemia    Esophageal stricture    Monoclonal paraproteinemia 03/03/2013   Palpitations    RBBB (right bundle branch block)    Past Surgical History:  Procedure Laterality Date   ANKLE SURGERY Left    APPENDECTOMY     CARDIAC CATHETERIZATION     Dr. Swaziland   CATARACT EXTRACTION W/ INTRAOCULAR LENS  IMPLANT, BILATERAL     CORONARY ARTERY BYPASS GRAFT  Oct. 2008   x 5,Dr.Gerhardt   CORONARY/GRAFT ACUTE MI REVASCULARIZATION N/A 04/22/2022   Procedure: Coronary/Graft Acute MI Revascularization;  Surgeon: Swaziland, Peter M, MD;  Location: [facility] INVASIVE CV LAB;  Service: Cardiovascular;  Laterality: N/A;   HERNIA REPAIR Bilateral    LEFT HEART CATH AND CORONARY ANGIOGRAPHY N/A 04/22/2022   Procedure: LEFT HEART CATH AND CORONARY ANGIOGRAPHY;  Surgeon: Swaziland, Peter M, MD;  Location: [facility] INVASIVE CV LAB;  Service: Cardiovascular;  Laterality: N/A;   PROSTATECTOMY  2004   radical   right shoulder      replacement   vein strippping      reports that he quit smoking about 56 years ago. His smoking use included cigarettes. He started smoking about 76 years ago. He has a 10  pack-year smoking history. He has never used smokeless tobacco. He reports current alcohol  use. He reports that he does not use drugs. Social History   Socioeconomic History   Marital status: Married    Spouse name: [name]   Number of children: 3   Years of education: Not on file   Highest education level: Bachelor's degree (e.g., BA, AB, BS)  Occupational History   Occupation: printing-retired  Tobacco Use   Smoking status: Former    Current packs/day: 0.00    Average packs/day: 0.5 packs/day for 20.0 years (10.0 ttl pk-yrs)    Types:  Cigarettes    Start date: 09/11/1947    Quit date: 09/11/1967    Years since quitting: 56.5   Smokeless tobacco: Never   Tobacco comments:    quit 45 years ago  Vaping Use   Vaping status: Never Used  Substance and Sexual Activity   Alcohol  use: Yes    Comment: social, 1-2 times per week    Drug use: No   Sexual activity: Not on file  Other Topics Concern   Not on file  Social History Narrative   Lives with wife at [facility]   Social Drivers of Health   Financial Resource Strain: Not on file  Food Insecurity: Patient Declined (01/31/2024)   Hunger Vital Sign    Worried About Running Out of Food in the Last Year: Patient declined    Ran Out of Food in the Last Year: Patient declined  Transportation Needs: Patient Declined (01/31/2024)   PRAPARE - Administrator, Civil Service (Medical): Patient declined    Lack of Transportation (Non-Medical): Patient declined  Physical Activity: Not on file  Stress: Not on file  Social Connections: Unknown (01/31/2024)   Social Connection and Isolation Panel    Frequency of Communication with Friends and Family: Patient declined    Frequency of Social Gatherings with Friends and Family: Patient declined    Attends Religious Services: Not on file    Active Member of Clubs or Organizations: Patient declined    Attends Banker Meetings: Patient declined    Marital Status: Patient declined  Intimate Partner Violence: Unknown (01/31/2024)   Humiliation, Afraid, Rape, and Kick questionnaire    Fear of Current or Ex-Partner: Patient declined    Emotionally Abused: Patient declined    Physically Abused: Not on file    Sexually Abused: Patient declined    Functional Status Survey:    Family History  Problem Relation Age of Onset   Heart failure Father        age 102   Heart attack Brother        age 73 post third open heart surgery   Heart attack Brother     Health Maintenance  Topic Date Due   Zoster Vaccines-  Shingrix (2 of 2) 03/17/2013   COVID-19 Vaccine (5 - 2024-25 season) 05/12/2023   Medicare Annual Wellness (AWV)  11/22/2023   INFLUENZA VACCINE  04/10/2024   HEMOGLOBIN A1C  08/03/2024   FOOT EXAM  11/03/2024   OPHTHALMOLOGY EXAM  11/25/2024   DTaP/Tdap/Td (4 - Tdap) 11/05/2033   Pneumococcal Vaccine: 50+ Years  Completed   Hepatitis B Vaccines  Aged Out   HPV VACCINES  Aged Out   Meningococcal B Vaccine  Aged Out    Allergies  Allergen Reactions   Other Itching     Mepergan Fortis, (meprozine)  = itching (patient disputes this in 2023)    Outpatient Encounter Medications as  of 03/23/2024  Medication Sig   acetaminophen  (TYLENOL ) 325 MG tablet Take 650 mg by mouth every 4 (four) hours as needed for fever or headache.   aspirin  EC 81 MG tablet Take 1 tablet (81 mg total) by mouth daily. Swallow whole.   bismuth subsalicylate (PEPTO BISMOL) 262 MG chewable tablet Chew 524 mg by mouth as needed.   Black Pepper-Turmeric 3-500 MG CAPS Take 1 capsule by mouth every Monday, Wednesday, and Friday.   Cyanocobalamin (VITAMIN B-12 PO) Take 5,000 mcg by mouth at bedtime.   docusate sodium  (COLACE) 100 MG capsule Take 100 mg by mouth 2 (two) times daily.   fenofibrate  (TRICOR ) 48 MG tablet Take 1 tablet (48 mg total) by mouth daily.   loratadine (CLARITIN) 10 MG tablet Take 10 mg by mouth daily.   meclizine  (ANTIVERT ) 12.5 MG tablet Take 1 tablet (12.5 mg total) by mouth 3 (three) times daily as needed for dizziness.   Multiple Vitamins-Minerals (PRESERVISION AREDS 2) CAPS Take 1 capsule by mouth in the morning and at bedtime.   nitroGLYCERIN  (NITROSTAT ) 0.4 MG SL tablet Place 1 tablet (0.4 mg total) under the tongue every 5 (five) minutes as needed for chest pain.   omeprazole  (PRILOSEC) 20 MG capsule Take 1 capsule (20 mg total) by mouth daily.   ondansetron  (ZOFRAN ) 4 MG tablet Take 4 mg by mouth every 8 (eight) hours as needed for nausea or vomiting.   polyethylene glycol (MIRALAX  /  GLYCOLAX ) 17 g packet Take 17 g by mouth daily as needed for mild constipation, moderate constipation or severe constipation.   simvastatin  (ZOCOR ) 20 MG tablet Take 20 mg by mouth at bedtime.     TRADJENTA  5 MG TABS tablet Take 5 mg by mouth daily.   traMADol  (ULTRAM ) 50 MG tablet Take 1 tablet (50 mg total) by mouth every 6 (six) hours as needed for up to 7 days.   nitroGLYCERIN  (NITROSTAT ) 0.4 MG SL tablet PLACE 1 TABLET (0.4 MG TOTAL) UNDER THE TONGUE EVERY 5 (FIVE) MINUTES AS NEEDED. (Patient not taking: Reported on 03/23/2024)   No facility-administered encounter medications on file as of 03/23/2024.    Review of Systems  Unable to perform ROS: Other    Vitals:   03/23/24 0926  BP: (!) 143/80  Resp: 14  Temp: 98 F (36.7 C)  SpO2: 97%  Weight: 160 lb (72.6 kg)  Height: 5' 7 (1.702 m)   Body mass index is 25.06 kg/m. Physical Exam Vitals reviewed.  Constitutional:      Appearance: Normal appearance.  HENT:     Head: Normocephalic.     Nose: Nose normal.     Mouth/Throat:     Mouth: Mucous membranes are moist.     Pharynx: Oropharynx is clear.  Eyes:     Pupils: Pupils are equal, round, and reactive to light.  Cardiovascular:     Rate and Rhythm: Normal rate and regular rhythm.     Pulses: Normal pulses.     Heart sounds: No murmur heard. Pulmonary:     Effort: Pulmonary effort is normal. No respiratory distress.     Breath sounds: Normal breath sounds. No rales.  Abdominal:     General: Abdomen is flat. Bowel sounds are normal.     Palpations: Abdomen is soft.  Musculoskeletal:        General: No swelling.     Cervical back: Neck supple.  Skin:    General: Skin is warm.  Neurological:     General:  No focal deficit present.     Mental Status: He is alert.  Psychiatric:        Mood and Affect: Mood normal.        Thought Content: Thought content normal.     Labs reviewed: Basic Metabolic Panel: Recent Labs    02/01/24 0501 02/25/24 0000  02/29/24 1948 03/19/24 0935  NA 134* 132* 137 137  K 3.9 4.1 4.0 4.1  CL 106 97* 103 103  CO2 21* 20 24 24   GLUCOSE 118*  --  127* 142*  BUN 21 20 22  29*  CREATININE 1.31* 1.4* 1.39* 1.26*  CALCIUM  7.4* 9.3 8.5* 8.9  MG  --   --  2.2  --    Liver Function Tests: Recent Labs    12/18/23 1532 01/30/24 1920 02/25/24 0000 02/29/24 1948  AST 11* 24 24 19   ALT 7 13 14 10   ALKPHOS 46 37* 66 36*  BILITOT 0.4 0.4  --  0.6  PROT 5.9* 5.7*  --  5.5*  ALBUMIN 3.6 2.9* 3.8 2.9*   Recent Labs    01/30/24 1920 02/29/24 1948  LIPASE 30 40   No results for input(s): AMMONIA in the last 8760 hours. CBC: Recent Labs    01/30/24 1920 02/01/24 0501 02/06/24 0000 02/25/24 0000 02/29/24 1948 03/19/24 0935  WBC 8.6 7.9   < > 4.0 8.2 9.2  NEUTROABS 8.0* 6.5  --   --  5.5  --   HGB 12.1* 10.8*   < > 12.3* 10.3* 11.8*  HCT 37.3* 33.4*   < > 37* 31.6* 37.0*  MCV 94.0 93.0  --   --  93.8 95.1  PLT 230 199   < > 238 236 237   < > = values in this interval not displayed.   Cardiac Enzymes: No results for input(s): CKTOTAL, CKMB, CKMBINDEX, TROPONINI in the last 8760 hours. BNP: Invalid input(s): POCBNP Lab Results  Component Value Date   HGBA1C 6.9 (H) 02/01/2024   Lab Results  Component Value Date   TSH 3.233 04/21/2022   Lab Results  Component Value Date   VITAMINB12 188 (L) 01/29/2013   Lab Results  Component Value Date   FOLATE 18.6 01/29/2013   No results found for: IRON, TIBC, FERRITIN  Imaging and Procedures obtained prior to SNF admission: CT PELVIS WO CONTRAST Result Date: 03/19/2024 CLINICAL DATA:  Hip trauma.  Fracture suspected. EXAM: CT PELVIS WITHOUT CONTRAST TECHNIQUE: Multidetector CT imaging of the pelvis was performed following the standard protocol without intravenous contrast. RADIATION DOSE REDUCTION: This exam was performed according to the departmental dose-optimization program which includes automated exposure control, adjustment  of the mA and/or kV according to patient size and/or use of iterative reconstruction technique. COMPARISON:  Left hip radiograph dated 03/19/2024. CT abdomen pelvis dated 01/31/2024. FINDINGS: Urinary Tract:  No abnormality visualized. Bowel:  Unremarkable visualized pelvic bowel loops. Vascular/Lymphatic: Advanced atherosclerotic calcification. Partially visualized aneurysm of the distal abdominal aorta better evaluated on the recent CT. Follow-up as per recommendation of prior CT. No pelvic adenopathy. Reproductive:  Prostatectomy. Other:  Small fat containing right inguinal hernia. Musculoskeletal: There is osteopenia with degenerative changes of the spine. Nondisplaced fracture of the left anterior superior pubic ramus adjacent to the symphysis pubis and mildly displaced fracture of the left inferior pubic ramus. No femoral neck fracture. No dislocation. There is subcutaneous contusion and hematoma of the lateral left hip. IMPRESSION: 1. Fractures of the left superior and inferior pubic rami. 2. Subcutaneous contusion  and hematoma of the lateral left hip. 3.  Aortic Atherosclerosis (ICD10-I70.0). Electronically Signed   By: Vanetta Chou M.D.   On: 03/19/2024 10:45   CT Head Wo Contrast Result Date: 03/19/2024 CLINICAL DATA:  Head trauma.  Fall in bathroom EXAM: CT HEAD WITHOUT CONTRAST TECHNIQUE: Contiguous axial images were obtained from the base of the skull through the vertex without intravenous contrast. RADIATION DOSE REDUCTION: This exam was performed according to the departmental dose-optimization program which includes automated exposure control, adjustment of the mA and/or kV according to patient size and/or use of iterative reconstruction technique. COMPARISON:  None Available. FINDINGS: Brain: No acute intracranial hemorrhage. No focal mass lesion. No CT evidence of acute infarction. No midline shift or mass effect. No hydrocephalus. Basilar cisterns are patent. There are periventricular and  subcortical white matter hypodensities. Generalized cortical atrophy. Vascular: No hyperdense vessel or unexpected calcification. Skull: Normal. Negative for fracture or focal lesion. Sinuses/Orbits: Paranasal sinuses and mastoid air cells are clear. Orbits are clear. Other: None. IMPRESSION: 1. No acute intracranial findings. 2. Atrophy and white matter microvascular disease. Electronically Signed   By: Jackquline Boxer M.D.   On: 03/19/2024 09:42   DG Hip [place] or [place] Pelvis 1 View Left Result Date: 03/19/2024 CLINICAL DATA:  Hip pain. EXAM: DG HIP (WITH OR WITHOUT PELVIS) 1V PORT LEFT COMPARISON:  None Available. FINDINGS: Frontal pelvis shows degenerative disc disease lower lumbar spine. SI joints and symphysis pubis unremarkable. No pubic ramus fracture. AP in limited frog-leg lateral views of the left hip show no evidence for femoral neck fracture. No femoral head dislocation. IMPRESSION: 1. No acute bony findings. 2. Degenerative disc disease lower lumbar spine. Electronically Signed   By: Camellia Candle M.D.   On: 03/19/2024 09:14    Assessment/Plan 1. Multiple closed fractures of pelvis without disruption of pelvic ring, initial encounter (HCC) (Primary) Pain Seems Controlled He is walking with Therapy Just using Tylenol  PRn WBAT 2. Dizziness Doreen was discontinued His labs and BP has been normal Does also uses Meclizine  PRn  3. MCI (mild cognitive impairment) Some worsening  This is worsening I am going to Discontinue his Tramadol  There is possibility he will move to SNF level of care  4. Acute depression He failed Zoloft due to it caused him weakness and Some drop in sodium level. He refused Wellbutrin Will continue to monitor  5. Atypical chest pain Refused Imdur  Also refused to change Prilosec to Protonix  Tod nurses to uses Tylenol  PRn  6. Type 2 diabetes mellitus with stage 3b chronic kidney disease, without long-term current use of insulin  (HCC) Was taken off  farixga to see if it helps with Dizziness On Tradjenta  only CBGS are running higher Will consider adding another med if Pattern continues  7. Dyslipidemia On statin  8. Coronary artery disease  Aspirin  and statin 9. Stage 3b chronic kidney disease (HCC) Creat stable  10. Weakness Is going to work with therapy     Family/ staff Communication:   Labs/tests ordered:

## 2024-03-31 DIAGNOSIS — F332 Major depressive disorder, recurrent severe without psychotic features: Secondary | ICD-10-CM | POA: Diagnosis not present

## 2024-04-02 DIAGNOSIS — R4182 Altered mental status, unspecified: Secondary | ICD-10-CM | POA: Diagnosis not present

## 2024-04-02 LAB — HEPATIC FUNCTION PANEL
ALT: 13 U/L (ref 10–40)
AST: 20 (ref 14–40)
Alkaline Phosphatase: 164 — AB (ref 25–125)
Bilirubin, Total: 0.5

## 2024-04-02 LAB — BASIC METABOLIC PANEL WITH GFR
BUN: 20 (ref 4–21)
CO2: 23 — AB (ref 13–22)
Chloride: 102 (ref 99–108)
Creatinine: 1.2 (ref 0.6–1.3)
Glucose: 169
Potassium: 4.5 meq/L (ref 3.5–5.1)
Sodium: 138 (ref 137–147)

## 2024-04-02 LAB — COMPREHENSIVE METABOLIC PANEL WITH GFR
Albumin: 3.5 (ref 3.5–5.0)
Calcium: 9 (ref 8.7–10.7)
Globulin: 2.8
eGFR: 57

## 2024-04-10 DIAGNOSIS — M6281 Muscle weakness (generalized): Secondary | ICD-10-CM | POA: Diagnosis not present

## 2024-04-10 DIAGNOSIS — R2689 Other abnormalities of gait and mobility: Secondary | ICD-10-CM | POA: Diagnosis not present

## 2024-04-10 DIAGNOSIS — R41841 Cognitive communication deficit: Secondary | ICD-10-CM | POA: Diagnosis not present

## 2024-04-10 DIAGNOSIS — M25552 Pain in left hip: Secondary | ICD-10-CM | POA: Diagnosis not present

## 2024-04-10 DIAGNOSIS — R531 Weakness: Secondary | ICD-10-CM | POA: Diagnosis not present

## 2024-04-10 DIAGNOSIS — Z9181 History of falling: Secondary | ICD-10-CM | POA: Diagnosis not present

## 2024-04-10 DIAGNOSIS — S32810S Multiple fractures of pelvis with stable disruption of pelvic ring, sequela: Secondary | ICD-10-CM | POA: Diagnosis not present

## 2024-04-10 DIAGNOSIS — R42 Dizziness and giddiness: Secondary | ICD-10-CM | POA: Diagnosis not present

## 2024-04-16 ENCOUNTER — Encounter: Payer: Self-pay | Admitting: Adult Health

## 2024-04-16 ENCOUNTER — Non-Acute Institutional Stay (SKILLED_NURSING_FACILITY): Payer: Self-pay | Admitting: Adult Health

## 2024-04-16 DIAGNOSIS — E1169 Type 2 diabetes mellitus with other specified complication: Secondary | ICD-10-CM

## 2024-04-16 DIAGNOSIS — N1832 Chronic kidney disease, stage 3b: Secondary | ICD-10-CM | POA: Diagnosis not present

## 2024-04-16 DIAGNOSIS — F0150 Vascular dementia without behavioral disturbance: Secondary | ICD-10-CM

## 2024-04-16 DIAGNOSIS — I10 Essential (primary) hypertension: Secondary | ICD-10-CM | POA: Insufficient documentation

## 2024-04-16 DIAGNOSIS — S3282XD Multiple fractures of pelvis without disruption of pelvic ring, subsequent encounter for fracture with routine healing: Secondary | ICD-10-CM | POA: Diagnosis not present

## 2024-04-16 DIAGNOSIS — E785 Hyperlipidemia, unspecified: Secondary | ICD-10-CM

## 2024-04-16 DIAGNOSIS — F0280 Dementia in other diseases classified elsewhere without behavioral disturbance: Secondary | ICD-10-CM | POA: Diagnosis not present

## 2024-04-16 DIAGNOSIS — G309 Alzheimer's disease, unspecified: Secondary | ICD-10-CM

## 2024-04-16 DIAGNOSIS — S3282XA Multiple fractures of pelvis without disruption of pelvic ring, initial encounter for closed fracture: Secondary | ICD-10-CM | POA: Insufficient documentation

## 2024-04-16 DIAGNOSIS — I7143 Infrarenal abdominal aortic aneurysm, without rupture: Secondary | ICD-10-CM | POA: Diagnosis not present

## 2024-04-16 DIAGNOSIS — I2510 Atherosclerotic heart disease of native coronary artery without angina pectoris: Secondary | ICD-10-CM | POA: Diagnosis not present

## 2024-04-16 DIAGNOSIS — E669 Obesity, unspecified: Secondary | ICD-10-CM

## 2024-04-16 DIAGNOSIS — I714 Abdominal aortic aneurysm, without rupture, unspecified: Secondary | ICD-10-CM | POA: Insufficient documentation

## 2024-04-16 NOTE — Progress Notes (Addendum)
 Location:  [facility] Room Number: 152 P Place of Service:  SNF [phone]) Provider:  Tawni America, NP    Patient Care Team: Charlanne Fredia CROME, MD as PCP - General (Internal Medicine) [place], Peter M, MD as PCP - Cardiology (Cardiology) Tat, Asberry RAMAN, DO as Attending Physician (Neurology) [place], Peter M, MD as Attending Physician (Cardiology) Twana Jeneal DASEN, MD (Hematology and Oncology) Rosalie Kitchens, MD as Attending Physician (Gastroenterology)  Extended Emergency Contact Information Primary Emergency Contact: [place],doug Mobile Phone: [phone] Relation: Son Secondary Emergency Contact: [facility] Phone: [phone] Relation: Daughter  Code Status:  DNR Goals of care: Advanced Directive information    03/19/2024    8:52 AM  Advanced Directives  Does Patient Have a Medical Advance Directive? No  Would patient like information on creating a medical advance directive? No - Patient declined     Chief Complaint  Patient presents with   Acute Visit    Fall f/u    HPI:  The patient is a 89 year old with cognitive impairment and coronary artery disease who presents after a fall.  Mechanical fall and pelvic fracture 03/19/24 ED visit  - Experienced a mechanical fall after rising from the toilet when his walker was not within reach - Sustained fractures of the left superior and inferior pubic rami and developed a a left hip hematoma - Currently ambulatory with a walker for short distances, uses a pWC for longer distances in skilled rehabilitation since the fall - No pain present during this visit  Cognitive impairment and neuropsychiatric symptoms - Cognitive impairment with sundowning, paranoia, and mild agitation without physical aggression - MMSE score of 16 out of 30 on July 17th, 2025  Cardiac symptoms and history - Coronary artery disease with prior non-ST elevation myocardial infarction and coronary artery bypass grafting -  Presented to the emergency room on June 21st, 2025, with stable angina and second-degree AV block - Continues on aspirin  and Zocor  and tricor    Diabetes mellitus CBG 150-190 - Diabetes mellitus with most recent hemoglobin A1c of 6.9% May 2025 - Currently on Tradjenta  5 mg  GERD No symptoms per pt Does have a chronic wet vocal quality No recent issues with cough or aspiration  LABS Urine culture: No growth (03/21/2024) Blood urea nitrogen (BUN): 19.6 mg/dL (92/75/7974) Serum creatinine: 1.2 mg/dL (92/75/7974) Hemoglobin A1c: 6.9% (02/01/2024) Lipid panel: LDL 42 mg/dL (97/7974) Troponin: No elevation (03/30/2024)  RADIOLOGY Pelvis CT: Fractures of the left superior and inferior pubic rami, hematoma, aortic atherosclerosis (03/19/2024) Head CT: No acute findings, cerebral atrophy, white matter microvascular disease (03/19/2024) Chest X-ray: Unremarkable (03/30/2024) Abdomen CT: Dilated pancreatic duct, dilation of abdominal aorta measuring 4.2 x 3.5 cm (01/31/2024)  DIAGNOSTIC Mini-Mental State Examination (MMSE): 16/30 (03/26/2024) Electrocardiogram (EKG): Second-degree atrioventricular (AV) block, multiple premature atrial contractions (PACs) (03/30/2024) Past Medical History:  Diagnosis Date   Cancer (HCC) 09/10/2002   prostate   Chronic kidney disease, stage 3b (HCC)    Coronary artery disease    Depression    Diabetes mellitus type 2 in obese    Dyslipidemia    Esophageal stricture    Monoclonal paraproteinemia 03/03/2013   Palpitations    RBBB (right bundle branch block)    Past Surgical History:  Procedure Laterality Date   ANKLE SURGERY Left    APPENDECTOMY     CARDIAC CATHETERIZATION     Dr. [place]   CATARACT EXTRACTION W/ INTRAOCULAR LENS  IMPLANT, BILATERAL     CORONARY ARTERY BYPASS GRAFT  Oct. 2008   x 5,Dr.Gerhardt   CORONARY/GRAFT ACUTE MI REVASCULARIZATION N/A 04/22/2022   Procedure: Coronary/Graft Acute MI Revascularization;  Surgeon: [place], Peter  M, MD;  Location: [facility] INVASIVE CV LAB;  Service: Cardiovascular;  Laterality: N/A;   HERNIA REPAIR Bilateral    LEFT HEART CATH AND CORONARY ANGIOGRAPHY N/A 04/22/2022   Procedure: LEFT HEART CATH AND CORONARY ANGIOGRAPHY;  Surgeon: [place], Peter M, MD;  Location: [facility] INVASIVE CV LAB;  Service: Cardiovascular;  Laterality: N/A;   PROSTATECTOMY  2004   radical   right shoulder      replacement   vein strippping      Allergies  Allergen Reactions   Other Itching     Mepergan Fortis, (meprozine)  = itching (patient disputes this in 2023)    Outpatient Encounter Medications as of 04/16/2024  Medication Sig   acetaminophen  (TYLENOL ) 325 MG tablet Take 650 mg by mouth every 4 (four) hours as needed for fever or headache.   aspirin  EC 81 MG tablet Take 1 tablet (81 mg total) by mouth daily. Swallow whole.   bismuth subsalicylate (PEPTO BISMOL) 262 MG chewable tablet Chew 524 mg by mouth as needed.   Cyanocobalamin (VITAMIN B-12 PO) Take 5,000 mcg by mouth at bedtime.   docusate sodium  (COLACE) 100 MG capsule Take 100 mg by mouth 2 (two) times daily.   fenofibrate  (TRICOR ) 48 MG tablet Take 1 tablet (48 mg total) by mouth daily.   loratadine (CLARITIN) 10 MG tablet Take 10 mg by mouth daily.   meclizine  (ANTIVERT ) 12.5 MG tablet Take 1 tablet (12.5 mg total) by mouth 3 (three) times daily as needed for dizziness.   Multiple Vitamins-Minerals (PRESERVISION AREDS 2) CAPS Take 1 capsule by mouth in the morning and at bedtime.   nitroGLYCERIN  (NITROSTAT ) 0.4 MG SL tablet Place 1 tablet (0.4 mg total) under the tongue every 5 (five) minutes as needed for chest pain.   omeprazole  (PRILOSEC) 20 MG capsule Take 1 capsule (20 mg total) by mouth daily.   ondansetron  (ZOFRAN ) 4 MG tablet Take 4 mg by mouth every 8 (eight) hours as needed for nausea or vomiting.   polyethylene glycol (MIRALAX  / GLYCOLAX ) 17 g packet Take 17 g by mouth daily as needed for mild constipation, moderate constipation or severe  constipation.   simvastatin  (ZOCOR ) 20 MG tablet Take 20 mg by mouth at bedtime.     TRADJENTA  5 MG TABS tablet Take 5 mg by mouth daily.   Black Pepper-Turmeric 3-500 MG CAPS Take 1 capsule by mouth every Monday, Wednesday, and Friday. (Patient not taking: Reported on 04/16/2024)   No facility-administered encounter medications on file as of 04/16/2024.    Review of Systems  Constitutional:  Positive for activity change. Negative for appetite change, chills, diaphoresis, fatigue, fever and unexpected weight change.  Respiratory:  Negative for cough, shortness of breath, wheezing and stridor.   Cardiovascular:  Negative for chest pain, palpitations and leg swelling.  Gastrointestinal:  Negative for abdominal distention, abdominal pain, constipation and diarrhea.  Genitourinary:  Negative for difficulty urinating and dysuria.  Musculoskeletal:  Positive for gait problem. Negative for arthralgias, back pain, joint swelling and myalgias.  Neurological:  Positive for dizziness. Negative for seizures, syncope, facial asymmetry, speech difficulty, weakness and headaches.  Hematological:  Negative for adenopathy. Does not bruise/bleed easily.  Psychiatric/Behavioral:  Positive for agitation, behavioral problems and confusion.     Immunization History  Administered Date(s) Administered   Influenza, Quadrivalent, Recombinant, Inj, Pf 05/06/2018, 06/05/2019, 06/21/2020,  06/05/2021, 05/30/2023   Influenza-Unspecified 06/05/2021, 07/02/2023   Moderna Sars-Covid-2 Vaccination 09/22/2019, 10/20/2019, 07/26/2020, 06/27/2021   PNEUMOCOCCAL CONJUGATE-20 06/29/2014   Pneumococcal Conjugate-13 06/21/2013   Pneumococcal-Unspecified 08/12/2013   Td 07/29/2009, 07/29/2013   Tetanus 11/06/2023   Zoster Recombinant(Shingrix) 01/20/2013   Pertinent  Health Maintenance Due  Topic Date Due   INFLUENZA VACCINE  04/10/2024   HEMOGLOBIN A1C  08/03/2024   FOOT EXAM  11/03/2024   OPHTHALMOLOGY EXAM  11/25/2024       04/21/2022    9:00 AM 04/22/2022    7:45 AM 04/22/2022   10:00 PM 04/23/2022   10:00 AM 07/23/2023    2:39 PM  Fall Risk  Falls in the past year?     0  Was there an injury with Fall?     0  Fall Risk Category Calculator     0  (RETIRED) Patient Fall Risk Level High fall risk  High fall risk  High fall risk  High fall risk    Patient at Risk for Falls Due to     Impaired balance/gait;Impaired mobility  Fall risk Follow up     Falls evaluation completed     Data saved with a previous flowsheet row definition   Functional Status Survey:    Vitals:   04/16/24 0845  BP: 116/72  Pulse: 76  Resp: 14  Temp: 98.1 F (36.7 C)  SpO2: 99%  Weight: 160 lb (72.6 kg)  Height: 5' 7 (1.702 m)   Body mass index is 25.06 kg/m. Physical Exam Vitals and nursing note reviewed.  Constitutional:      Appearance: Normal appearance.  HENT:     Head: Normocephalic and atraumatic.     Comments: Chronic wet vocal quality     Nose: Nose normal.     Mouth/Throat:     Mouth: Mucous membranes are moist.     Pharynx: Oropharynx is clear.  Eyes:     Conjunctiva/sclera: Conjunctivae normal.     Pupils: Pupils are equal, round, and reactive to light.  Cardiovascular:     Rate and Rhythm: Normal rate and regular rhythm.     Heart sounds: No murmur heard. Pulmonary:     Effort: Pulmonary effort is normal. No respiratory distress.     Breath sounds: Normal breath sounds. No wheezing.  Abdominal:     General: Bowel sounds are normal. There is no distension.     Palpations: Abdomen is soft.     Tenderness: There is no abdominal tenderness.  Musculoskeletal:     Right lower leg: No edema.     Left lower leg: No edema.     Comments: Strength 4/5 BLE  Skin:    General: Skin is warm and dry.  Neurological:     General: No focal deficit present.     Mental Status: He is alert. Mental status is at baseline.  Psychiatric:        Mood and Affect: Mood normal.     Labs reviewed: Recent Labs     02/01/24 0501 02/25/24 0000 02/29/24 1948 03/19/24 0935 04/02/24 0000  NA 134*   < > 137 137 138  K 3.9   < > 4.0 4.1 4.5  CL 106   < > 103 103 102  CO2 21*   < > 24 24 23*  GLUCOSE 118*  --  127* 142*  --   BUN 21   < > 22 29* 20  CREATININE 1.31*   < > 1.39* 1.26* 1.2  CALCIUM  7.4*   < > 8.5* 8.9 9.0  MG  --   --  2.2  --   --    < > = values in this interval not displayed.   Recent Labs    12/18/23 1532 01/30/24 1920 02/25/24 0000 02/29/24 1948 04/02/24 0000  AST 11* 24 24 19 20   ALT 7 13 14 10 13   ALKPHOS 46 37* 66 36* 164*  BILITOT 0.4 0.4  --  0.6  --   PROT 5.9* 5.7*  --  5.5*  --   ALBUMIN 3.6 2.9* 3.8 2.9* 3.5   Recent Labs    01/30/24 1920 02/01/24 0501 02/06/24 0000 02/25/24 0000 02/29/24 1948 03/19/24 0935  WBC 8.6 7.9   < > 4.0 8.2 9.2  NEUTROABS 8.0* 6.5  --   --  5.5  --   HGB 12.1* 10.8*   < > 12.3* 10.3* 11.8*  HCT 37.3* 33.4*   < > 37* 31.6* 37.0*  MCV 94.0 93.0  --   --  93.8 95.1  PLT 230 199   < > 238 236 237   < > = values in this interval not displayed.   Lab Results  Component Value Date   TSH 3.233 04/21/2022   Lab Results  Component Value Date   HGBA1C 6.9 (H) 02/01/2024   Lab Results  Component Value Date   CHOL 96 11/04/2023   HDL 44 11/04/2023   LDLCALC 42 11/04/2023   TRIG 52 11/04/2023   CHOLHDL 2.8 04/21/2022    Significant Diagnostic Results in last 30 days:  CT PELVIS WO CONTRAST Result Date: 03/19/2024 CLINICAL DATA:  Hip trauma.  Fracture suspected. EXAM: CT PELVIS WITHOUT CONTRAST TECHNIQUE: Multidetector CT imaging of the pelvis was performed following the standard protocol without intravenous contrast. RADIATION DOSE REDUCTION: This exam was performed according to the departmental dose-optimization program which includes automated exposure control, adjustment of the mA and/or kV according to patient size and/or use of iterative reconstruction technique. COMPARISON:  Left hip radiograph dated 03/19/2024. CT  abdomen pelvis dated 01/31/2024. FINDINGS: Urinary Tract:  No abnormality visualized. Bowel:  Unremarkable visualized pelvic bowel loops. Vascular/Lymphatic: Advanced atherosclerotic calcification. Partially visualized aneurysm of the distal abdominal aorta better evaluated on the recent CT. Follow-up as per recommendation of prior CT. No pelvic adenopathy. Reproductive:  Prostatectomy. Other:  Small fat containing right inguinal hernia. Musculoskeletal: There is osteopenia with degenerative changes of the spine. Nondisplaced fracture of the left anterior superior pubic ramus adjacent to the symphysis pubis and mildly displaced fracture of the left inferior pubic ramus. No femoral neck fracture. No dislocation. There is subcutaneous contusion and hematoma of the lateral left hip. IMPRESSION: 1. Fractures of the left superior and inferior pubic rami. 2. Subcutaneous contusion and hematoma of the lateral left hip. 3.  Aortic Atherosclerosis (ICD10-I70.0). Electronically Signed   By: Vanetta Chou M.D.   On: 03/19/2024 10:45   CT Head Wo Contrast Result Date: 03/19/2024 CLINICAL DATA:  Head trauma.  Fall in bathroom EXAM: CT HEAD WITHOUT CONTRAST TECHNIQUE: Contiguous axial images were obtained from the base of the skull through the vertex without intravenous contrast. RADIATION DOSE REDUCTION: This exam was performed according to the departmental dose-optimization program which includes automated exposure control, adjustment of the mA and/or kV according to patient size and/or use of iterative reconstruction technique. COMPARISON:  None Available. FINDINGS: Brain: No acute intracranial hemorrhage. No focal mass lesion. No CT evidence of acute infarction. No midline shift or mass  effect. No hydrocephalus. Basilar cisterns are patent. There are periventricular and subcortical white matter hypodensities. Generalized cortical atrophy. Vascular: No hyperdense vessel or unexpected calcification. Skull: Normal.  Negative for fracture or focal lesion. Sinuses/Orbits: Paranasal sinuses and mastoid air cells are clear. Orbits are clear. Other: None. IMPRESSION: 1. No acute intracranial findings. 2. Atrophy and white matter microvascular disease. Electronically Signed   By: Jackquline Boxer M.D.   On: 03/19/2024 09:42   DG Hip [place] or [place] Pelvis 1 View Left Result Date: 03/19/2024 CLINICAL DATA:  Hip pain. EXAM: DG HIP (WITH OR WITHOUT PELVIS) 1V PORT LEFT COMPARISON:  None Available. FINDINGS: Frontal pelvis shows degenerative disc disease lower lumbar spine. SI joints and symphysis pubis unremarkable. No pubic ramus fracture. AP in limited frog-leg lateral views of the left hip show no evidence for femoral neck fracture. No femoral head dislocation. IMPRESSION: 1. No acute bony findings. 2. Degenerative disc disease lower lumbar spine. Electronically Signed   By: Camellia Candle M.D.   On: 03/19/2024 09:14    Assessment/Plan  Left pubic rami fractures with pelvic hematoma Fractures of the left superior and inferior pubic rami with pelvic hematoma. Ambulatory with walker. Orthopedic follow-up indicates stability. - Continue weight bearing as tolerated. - Follow up with orthopedics in four weeks. - Continue therapy. - Administer Tylenol  for pain as needed. -likely will need a skilled care room vs AL with home care.   Dementia Mixed AD and Vascular  Chronic cognitive impairment with paranoia and sundowning. No depression per geropsych evaluation; paranoia noted, consider need for antipsychotic medication if symptoms worsen. - Monitor cognitive status and paranoia. -pt not agreeable to further meds.   Coronary artery disease with history of NSTEMI and CABG History of NSTEMI and CABG. Recent stable angina with second degree AV block due to PACs. Imdur  discontinued due to adverse effects. - Monitor for angina symptoms. - Continue aspirin  and statin therapy. -Refuses Imdur   -follows with cardiology    Aortic atherosclerosis and abdominal aortic aneurysm Aortic atherosclerosis with abdominal aortic aneurysm measuring 4.2 x 3.5 cm on CT 01/31/24 - Schedule follow-up CT of the abdomen in 12 months. - Consider referral to vascular specialist.  Type 2 diabetes mellitus Type 2 diabetes mellitus with last A1c of 6.9. Farxiga discontinued due to vertigo. Currently on Tradjenta  5 mg. - Repeat A1c in September.  Chronic kidney disease Chronic kidney disease with recent labs showing BUN of 19.6 and creatinine of 1.2.  Hypertension Hypertension currently controlled.  Hyperlipidemia Hyperlipidemia with last LDL of 42. On Zocor  and tricor    Gastroesophageal reflux disease (GERD) GERD managed with Prilosec. - Continue Prilosec.

## 2024-04-20 ENCOUNTER — Encounter: Admitting: Adult Health

## 2024-04-20 DIAGNOSIS — S32512D Fracture of superior rim of left pubis, subsequent encounter for fracture with routine healing: Secondary | ICD-10-CM | POA: Diagnosis not present

## 2024-04-27 ENCOUNTER — Non-Acute Institutional Stay (SKILLED_NURSING_FACILITY): Payer: Self-pay | Admitting: Internal Medicine

## 2024-04-27 ENCOUNTER — Encounter: Payer: Self-pay | Admitting: Internal Medicine

## 2024-04-27 ENCOUNTER — Encounter: Admitting: Adult Health

## 2024-04-27 DIAGNOSIS — S3282XD Multiple fractures of pelvis without disruption of pelvic ring, subsequent encounter for fracture with routine healing: Secondary | ICD-10-CM

## 2024-04-27 DIAGNOSIS — F0280 Dementia in other diseases classified elsewhere without behavioral disturbance: Secondary | ICD-10-CM

## 2024-04-27 DIAGNOSIS — E669 Obesity, unspecified: Secondary | ICD-10-CM

## 2024-04-27 DIAGNOSIS — N1832 Chronic kidney disease, stage 3b: Secondary | ICD-10-CM | POA: Diagnosis not present

## 2024-04-27 DIAGNOSIS — F0150 Vascular dementia without behavioral disturbance: Secondary | ICD-10-CM

## 2024-04-27 DIAGNOSIS — G309 Alzheimer's disease, unspecified: Secondary | ICD-10-CM | POA: Diagnosis not present

## 2024-04-27 DIAGNOSIS — I10 Essential (primary) hypertension: Secondary | ICD-10-CM

## 2024-04-27 DIAGNOSIS — E1169 Type 2 diabetes mellitus with other specified complication: Secondary | ICD-10-CM | POA: Diagnosis not present

## 2024-04-27 DIAGNOSIS — E782 Mixed hyperlipidemia: Secondary | ICD-10-CM

## 2024-04-27 NOTE — Progress Notes (Signed)
 Location:  [facility] Room Number: 152-P Place of Service:  SNF [phone]) Provider:  Charlanne Fredia CROME, MD  Patient Care Team: Charlanne Fredia CROME, MD as PCP - General (Internal Medicine) [place], Peter M, MD as PCP - Cardiology (Cardiology) Tat, Asberry RAMAN, DO as Attending Physician (Neurology) [place], Peter M, MD as Attending Physician (Cardiology) Twana Jeneal DASEN, MD (Hematology and Oncology) Rosalie Kitchens, MD as Attending Physician (Gastroenterology)  Extended Emergency Contact Information Primary Emergency Contact: Hardigree,doug Mobile Phone: [phone] Relation: Son Secondary Emergency Contact: [facility] Phone: [phone] Relation: Daughter  Code Status:  DNR Goals of care: Advanced Directive information    03/19/2024    8:52 AM  Advanced Directives  Does Patient Have a Medical Advance Directive? No  Would patient like information on creating a medical advance directive? No - Patient declined     Chief Complaint  Patient presents with   Stomach issues    HPI:  Pt is a 89 y.o. male seen today for an acute visit    Patient fell in his  Room in [place] in [place] on 03/19/2024 and was found on CT  to have  Fractures of the left superior and inferior pubic rami.  Subcutaneous contusion and hematoma of the lateral left hip   Patient was admitted to Rehab for therapy and Pain control  Since patient has been here he has had Worsening Cognition Continues to have issues with his gait High risk of falls Does not call for help.  And now he is not allowed to use his power chair without Supervision Also gets agitated when his wife leaves the unit  He wanted to talk to me to say he can go back to AL with his wife He feels he is here against his will He kept saying why they cannot trust him with walking independent His family  want him to go to SNF And that is the plan His Last MMSE was 16/30  Other history He has h/o NSTEMI From 04/20/2022   Diabetes Mellitus coronary artery disease s/p CABG with 6 vessels in 2018, hypertension, diabetes mellitus, CKD stage IIIb, HLD And diagnosis of cognitive impairment and depression by Dr. Buck  Past Medical History:  Diagnosis Date   Cancer (HCC) 09/10/2002   prostate   Chronic kidney disease, stage 3b (HCC)    Coronary artery disease    Depression    Diabetes mellitus type 2 in obese    Dyslipidemia    Esophageal stricture    Monoclonal paraproteinemia 03/03/2013   Palpitations    RBBB (right bundle branch block)    Past Surgical History:  Procedure Laterality Date   ANKLE SURGERY Left    APPENDECTOMY     CARDIAC CATHETERIZATION     Dr. [place]   CATARACT EXTRACTION W/ INTRAOCULAR LENS  IMPLANT, BILATERAL     CORONARY ARTERY BYPASS GRAFT  Oct. 2008   x 5,Dr.Gerhardt   CORONARY/GRAFT ACUTE MI REVASCULARIZATION N/A 04/22/2022   Procedure: Coronary/Graft Acute MI Revascularization;  Surgeon: [place], Peter M, MD;  Location: [facility] INVASIVE CV LAB;  Service: Cardiovascular;  Laterality: N/A;   HERNIA REPAIR Bilateral    LEFT HEART CATH AND CORONARY ANGIOGRAPHY N/A 04/22/2022   Procedure: LEFT HEART CATH AND CORONARY ANGIOGRAPHY;  Surgeon: [place], Peter M, MD;  Location: [facility] INVASIVE CV LAB;  Service: Cardiovascular;  Laterality: N/A;   PROSTATECTOMY  2004   radical   right shoulder      replacement   vein strippping  Allergies  Allergen Reactions   Other Itching     Mepergan Fortis, (meprozine)  = itching (patient disputes this in 2023)    Outpatient Encounter Medications as of 04/27/2024  Medication Sig   aspirin  EC 81 MG tablet Take 1 tablet (81 mg total) by mouth daily. Swallow whole.   Cyanocobalamin (VITAMIN B-12 PO) Take 5,000 mcg by mouth at bedtime.   docusate sodium  (COLACE) 100 MG capsule Take 100 mg by mouth 2 (two) times daily.   fenofibrate  (TRICOR ) 48 MG tablet Take 1 tablet (48 mg total) by mouth daily.   loratadine (CLARITIN) 10 MG tablet Take 10 mg by mouth  daily.   Multiple Vitamins-Minerals (PRESERVISION AREDS 2) CAPS Take 1 capsule by mouth in the morning and at bedtime.   omeprazole  (PRILOSEC) 20 MG capsule Take 1 capsule (20 mg total) by mouth daily.   simvastatin  (ZOCOR ) 20 MG tablet Take 20 mg by mouth at bedtime.     TRADJENTA  5 MG TABS tablet Take 5 mg by mouth daily.   acetaminophen  (TYLENOL ) 325 MG tablet Take 650 mg by mouth every 4 (four) hours as needed for fever or headache.   bismuth subsalicylate (PEPTO BISMOL) 262 MG chewable tablet Chew 524 mg by mouth as needed.   meclizine  (ANTIVERT ) 12.5 MG tablet Take 1 tablet (12.5 mg total) by mouth 3 (three) times daily as needed for dizziness.   nitroGLYCERIN  (NITROSTAT ) 0.4 MG SL tablet Place 1 tablet (0.4 mg total) under the tongue every 5 (five) minutes as needed for chest pain.   ondansetron  (ZOFRAN ) 4 MG tablet Take 4 mg by mouth every 8 (eight) hours as needed for nausea or vomiting.   polyethylene glycol (MIRALAX  / GLYCOLAX ) 17 g packet Take 17 g by mouth daily as needed for mild constipation, moderate constipation or severe constipation.   No facility-administered encounter medications on file as of 04/27/2024.    Review of Systems  Unable to perform ROS: Dementia    Immunization History  Administered Date(s) Administered   Influenza, Quadrivalent, Recombinant, Inj, Pf 05/06/2018, 06/05/2019, 06/21/2020, 06/05/2021, 05/30/2023   Influenza-Unspecified 06/05/2021, 07/02/2023   Moderna Sars-Covid-2 Vaccination 09/22/2019, 10/20/2019, 07/26/2020, 06/27/2021   PNEUMOCOCCAL CONJUGATE-20 06/29/2014   Pneumococcal Conjugate-13 06/21/2013   Pneumococcal-Unspecified 08/12/2013   Td 07/29/2009, 07/29/2013   Tetanus 11/06/2023   Zoster Recombinant(Shingrix) 01/20/2013   Pertinent  Health Maintenance Due  Topic Date Due   INFLUENZA VACCINE  04/10/2024   HEMOGLOBIN A1C  08/03/2024   FOOT EXAM  11/03/2024   OPHTHALMOLOGY EXAM  11/25/2024      04/21/2022    9:00 AM 04/22/2022     7:45 AM 04/22/2022   10:00 PM 04/23/2022   10:00 AM 07/23/2023    2:39 PM  Fall Risk  Falls in the past year?     0  Was there an injury with Fall?     0  Fall Risk Category Calculator     0  (RETIRED) Patient Fall Risk Level High fall risk  High fall risk  High fall risk  High fall risk    Patient at Risk for Falls Due to     Impaired balance/gait;Impaired mobility  Fall risk Follow up     Falls evaluation completed     Data saved with a previous flowsheet row definition   Functional Status Survey:    Vitals:   04/27/24 1002  BP: 138/76  Pulse: 79  Temp: 97.9 F (36.6 C)  SpO2: 96%  Weight: 159 lb 3.2 oz (  72.2 kg)  Height: 5' 7 (1.702 m)   Body mass index is 24.93 kg/m. Physical Exam Vitals reviewed.  Constitutional:      Appearance: Normal appearance.  HENT:     Head: Normocephalic.     Nose: Nose normal.     Mouth/Throat:     Mouth: Mucous membranes are moist.     Pharynx: Oropharynx is clear.  Eyes:     Pupils: Pupils are equal, round, and reactive to light.  Cardiovascular:     Rate and Rhythm: Normal rate and regular rhythm.     Pulses: Normal pulses.     Heart sounds: No murmur heard. Pulmonary:     Effort: Pulmonary effort is normal. No respiratory distress.     Breath sounds: Normal breath sounds. No rales.  Abdominal:     General: Abdomen is flat. Bowel sounds are normal.     Palpations: Abdomen is soft.  Musculoskeletal:        General: No swelling.     Cervical back: Neck supple.  Skin:    General: Skin is warm.  Neurological:     General: No focal deficit present.     Mental Status: He is alert.  Psychiatric:        Mood and Affect: Mood normal.        Thought Content: Thought content normal.     Labs reviewed: Recent Labs    02/01/24 0501 02/25/24 0000 02/29/24 1948 03/19/24 0935 04/02/24 0000  NA 134*   < > 137 137 138  K 3.9   < > 4.0 4.1 4.5  CL 106   < > 103 103 102  CO2 21*   < > 24 24 23*  GLUCOSE 118*  --  127* 142*  --    BUN 21   < > 22 29* 20  CREATININE 1.31*   < > 1.39* 1.26* 1.2  CALCIUM  7.4*   < > 8.5* 8.9 9.0  MG  --   --  2.2  --   --    < > = values in this interval not displayed.   Recent Labs    12/18/23 1532 01/30/24 1920 02/25/24 0000 02/29/24 1948 04/02/24 0000  AST 11* 24 24 19 20   ALT 7 13 14 10 13   ALKPHOS 46 37* 66 36* 164*  BILITOT 0.4 0.4  --  0.6  --   PROT 5.9* 5.7*  --  5.5*  --   ALBUMIN 3.6 2.9* 3.8 2.9* 3.5   Recent Labs    01/30/24 1920 02/01/24 0501 02/06/24 0000 02/25/24 0000 02/29/24 1948 03/19/24 0935  WBC 8.6 7.9   < > 4.0 8.2 9.2  NEUTROABS 8.0* 6.5  --   --  5.5  --   HGB 12.1* 10.8*   < > 12.3* 10.3* 11.8*  HCT 37.3* 33.4*   < > 37* 31.6* 37.0*  MCV 94.0 93.0  --   --  93.8 95.1  PLT 230 199   < > 238 236 237   < > = values in this interval not displayed.   Lab Results  Component Value Date   TSH 3.233 04/21/2022   Lab Results  Component Value Date   HGBA1C 6.9 (H) 02/01/2024   Lab Results  Component Value Date   CHOL 96 11/04/2023   HDL 44 11/04/2023   LDLCALC 42 11/04/2023   TRIG 52 11/04/2023   CHOLHDL 2.8 04/21/2022    Significant Diagnostic Results in last 30 days:  No results found.  Assessment/Plan 1. Mixed Alzheimer's and vascular dementia (HCC) (Primary) Initially hope was that patient will get better with therapy and would be able to go to his room in [place] But patient has had progressive worsening of Cogntion and is high risk for fall  His wife is not able to help him He would be admitted to SNF for now. His family son and Wife are agreeable Patient is being seeing Dr Tasia also He has not tolerated Lexapro. And Refused Wellbutrin Will wait for him to move to Skilled  Possible have to try Antidepressant again   2. Type 2 diabetes mellitus with obesity (HCC) Was taken off Farxiga to see If it helps his Gait and Dizziness His CBGS have been less then 200 mostly Plan to check A1C next month On Tradjenta   3. Essential  hypertension No Meds  4. Stage 3b chronic kidney disease (HCC) creat stable  5. Multiple closed fractures of pelvis without disruption of pelvic ring with routine healing, subsequent encounter Doing well  Gait is unstable Pain controlled  6. Mixed hyperlipidemia zocor  7 CAD On Statin and Aspirin    Family/ staff Communication:   Labs/tests ordered:

## 2024-05-07 ENCOUNTER — Non-Acute Institutional Stay (SKILLED_NURSING_FACILITY): Payer: Self-pay | Admitting: Adult Health

## 2024-05-07 ENCOUNTER — Encounter: Payer: Self-pay | Admitting: Adult Health

## 2024-05-07 DIAGNOSIS — N1832 Chronic kidney disease, stage 3b: Secondary | ICD-10-CM | POA: Diagnosis not present

## 2024-05-07 DIAGNOSIS — E785 Hyperlipidemia, unspecified: Secondary | ICD-10-CM | POA: Diagnosis not present

## 2024-05-07 DIAGNOSIS — E669 Obesity, unspecified: Secondary | ICD-10-CM

## 2024-05-07 DIAGNOSIS — I7143 Infrarenal abdominal aortic aneurysm, without rupture: Secondary | ICD-10-CM | POA: Diagnosis not present

## 2024-05-07 DIAGNOSIS — E1169 Type 2 diabetes mellitus with other specified complication: Secondary | ICD-10-CM | POA: Diagnosis not present

## 2024-05-07 DIAGNOSIS — S3282XA Multiple fractures of pelvis without disruption of pelvic ring, initial encounter for closed fracture: Secondary | ICD-10-CM

## 2024-05-07 DIAGNOSIS — I10 Essential (primary) hypertension: Secondary | ICD-10-CM | POA: Diagnosis not present

## 2024-05-07 NOTE — Progress Notes (Unsigned)
 Location:  [facility] Room Number: 140 P Place of Service:  SNF [phone]) Provider:  Tawni America, NP   Patient Care Team: Charlanne Fredia CROME, MD as PCP - General (Internal Medicine) [place], Peter M, MD as PCP - Cardiology (Cardiology) Tat, Asberry RAMAN, DO as Attending Physician (Neurology) [place], Peter M, MD as Attending Physician (Cardiology) Twana Jeneal DASEN, MD (Hematology and Oncology) Rosalie Kitchens, MD as Attending Physician (Gastroenterology)  Extended Emergency Contact Information Primary Emergency Contact: Gero,doug Mobile Phone: [phone] Relation: Son Secondary Emergency Contact: [facility] Phone: [phone] Relation: Daughter  Code Status:  DNR Goals of care: Advanced Directive information    03/19/2024    8:52 AM  Advanced Directives  Does Patient Have a Medical Advance Directive? No  Would patient like information on creating a medical advance directive? No - Patient declined     Chief Complaint  Patient presents with   Routine Visit    HPI:  Pt is a 89 y.o. male seen today for medical management of chronic diseases.    He moved to skilled care today. Somewhat down, says he will need time to adjust.   Ambulatory with a walker after a fall 03/19/24 with subsequent fractures of the left superior and inferior pubic rami and developed a a left hip hematoma  Denies pain  Cognitive impairment and neuropsychiatric symptoms - periods of increased confusion with lucidity. At times can be paranoid. No aggression. Has been evaluated by psych, no meds needed for mood at the time.  - MMSE score of 16 out of 30 on July 17th, 2025   Cardiac symptoms and history - Coronary artery disease with prior non-ST elevation myocardial infarction and coronary artery bypass grafting - Presented to the emergency room on June 21st, 2025, with stable angina and second-degree AV block - Continues on aspirin , Tricor , and Zocor    Diabetes  mellitus CBG 150-190 - Diabetes mellitus with most recent hemoglobin A1c of 6.9% May 2025 - Currently on Tradjenta  5 mg   GERD No symptoms per pt Does have a chronic wet vocal quality No recent issues with cough or aspiration  Intermittent dizziness uses meclizine  prn. No current issues.   Pelvis CT: Fractures of the left superior and inferior pubic rami, hematoma, aortic atherosclerosis (03/19/2024) Head CT: No acute findings, cerebral atrophy, white matter microvascular disease (03/19/2024) Chest X-ray: Unremarkable (03/30/2024) Abdomen CT: Dilated pancreatic duct, dilation of abdominal aorta measuring 4.2 x 3.5 cm (01/31/2024) Past Medical History:  Diagnosis Date   Cancer (HCC) 09/10/2002   prostate   Chronic kidney disease, stage 3b (HCC)    Coronary artery disease    Depression    Diabetes mellitus type 2 in obese    Dyslipidemia    Esophageal stricture    Monoclonal paraproteinemia 03/03/2013   Palpitations    RBBB (right bundle branch block)    Past Surgical History:  Procedure Laterality Date   ANKLE SURGERY Left    APPENDECTOMY     CARDIAC CATHETERIZATION     Dr. [place]   CATARACT EXTRACTION W/ INTRAOCULAR LENS  IMPLANT, BILATERAL     CORONARY ARTERY BYPASS GRAFT  Oct. 2008   x 5,Dr.Gerhardt   CORONARY/GRAFT ACUTE MI REVASCULARIZATION N/A 04/22/2022   Procedure: Coronary/Graft Acute MI Revascularization;  Surgeon: [place], Peter M, MD;  Location: [facility] INVASIVE CV LAB;  Service: Cardiovascular;  Laterality: N/A;   HERNIA REPAIR Bilateral    LEFT HEART CATH AND CORONARY ANGIOGRAPHY N/A 04/22/2022   Procedure: LEFT HEART CATH  AND CORONARY ANGIOGRAPHY;  Surgeon: [place], Peter M, MD;  Location: [facility] INVASIVE CV LAB;  Service: Cardiovascular;  Laterality: N/A;   PROSTATECTOMY  2004   radical   right shoulder      replacement   vein strippping      Allergies  Allergen Reactions   Other Itching     Mepergan Fortis, (meprozine)  = itching (patient disputes this in  2023)    Outpatient Encounter Medications as of 05/07/2024  Medication Sig   acetaminophen  (TYLENOL ) 325 MG tablet Take 650 mg by mouth every 4 (four) hours as needed for fever or headache.   aspirin  EC 81 MG tablet Take 1 tablet (81 mg total) by mouth daily. Swallow whole.   bismuth subsalicylate (PEPTO BISMOL) 262 MG chewable tablet Chew 262 mg by mouth as needed. Give 15 ml orally every 8 hours as needed for Loose Stool   Cyanocobalamin (VITAMIN B-12 PO) Take 5,000 mcg by mouth at bedtime.   docusate sodium  (COLACE) 100 MG capsule Take 100 mg by mouth 2 (two) times daily.   fenofibrate  (TRICOR ) 48 MG tablet Take 1 tablet (48 mg total) by mouth daily.   loratadine (CLARITIN) 10 MG tablet Take 10 mg by mouth daily.   meclizine  (ANTIVERT ) 12.5 MG tablet Take 1 tablet (12.5 mg total) by mouth 3 (three) times daily as needed for dizziness.   Multiple Vitamins-Minerals (PRESERVISION AREDS 2) CAPS Take 1 capsule by mouth in the morning and at bedtime.   nitroGLYCERIN  (NITROSTAT ) 0.4 MG SL tablet Place 1 tablet (0.4 mg total) under the tongue every 5 (five) minutes as needed for chest pain.   omeprazole  (PRILOSEC) 20 MG capsule Take 1 capsule (20 mg total) by mouth daily.   ondansetron  (ZOFRAN ) 4 MG tablet Take 4 mg by mouth every 8 (eight) hours as needed for nausea or vomiting.   polyethylene glycol (MIRALAX  / GLYCOLAX ) 17 g packet Take 17 g by mouth daily as needed for mild constipation, moderate constipation or severe constipation.   simvastatin  (ZOCOR ) 20 MG tablet Take 20 mg by mouth at bedtime.     TRADJENTA  5 MG TABS tablet Take 5 mg by mouth daily.   No facility-administered encounter medications on file as of 05/07/2024.    Review of Systems  Constitutional:  Negative for activity change, appetite change, chills, diaphoresis, fatigue, fever and unexpected weight change.  Respiratory:  Negative for cough, shortness of breath, wheezing and stridor.   Cardiovascular:  Negative for chest  pain, palpitations and leg swelling.  Gastrointestinal:  Negative for abdominal distention, abdominal pain, constipation and diarrhea.  Genitourinary:  Negative for difficulty urinating and dysuria.  Musculoskeletal:  Positive for gait problem. Negative for arthralgias, back pain, joint swelling and myalgias.  Neurological:  Negative for dizziness, seizures, syncope, facial asymmetry, speech difficulty, weakness and headaches.  Hematological:  Negative for adenopathy. Does not bruise/bleed easily.  Psychiatric/Behavioral:  Positive for confusion and dysphoric mood. Negative for agitation and behavioral problems.     Immunization History  Administered Date(s) Administered   Influenza, Quadrivalent, Recombinant, Inj, Pf 05/06/2018, 06/05/2019, 06/21/2020, 06/05/2021, 05/30/2023   Influenza-Unspecified 06/05/2021, 07/02/2023   Moderna Sars-Covid-2 Vaccination 09/22/2019, 10/20/2019, 07/26/2020, 06/27/2021   PNEUMOCOCCAL CONJUGATE-20 06/29/2014   Pneumococcal Conjugate-13 06/21/2013   Pneumococcal-Unspecified 08/12/2013   Td 07/29/2009, 07/29/2013   Tetanus 11/06/2023   Zoster Recombinant(Shingrix) 01/20/2013   Pertinent  Health Maintenance Due  Topic Date Due   INFLUENZA VACCINE  04/10/2024   HEMOGLOBIN A1C  08/03/2024   FOOT EXAM  11/03/2024   OPHTHALMOLOGY EXAM  11/25/2024      04/21/2022    9:00 AM 04/22/2022    7:45 AM 04/22/2022   10:00 PM 04/23/2022   10:00 AM 07/23/2023    2:39 PM  Fall Risk  Falls in the past year?     0  Was there an injury with Fall?     0  Fall Risk Category Calculator     0  (RETIRED) Patient Fall Risk Level High fall risk  High fall risk  High fall risk  High fall risk    Patient at Risk for Falls Due to     Impaired balance/gait;Impaired mobility  Fall risk Follow up     Falls evaluation completed     Data saved with a previous flowsheet row definition   Functional Status Survey:    Vitals:   05/07/24 1032 05/08/24 0855  BP: (!) 150/84 (!)  146/85  Pulse: 83   Resp: 15   Temp: 98 F (36.7 C)   SpO2: 99%   Weight: 156 lb 9.6 oz (71 kg)   Height: 5' 7 (1.702 m)    Body mass index is 24.53 kg/m. Physical Exam Vitals reviewed.  Constitutional:      Appearance: Normal appearance.  HENT:     Head: Normocephalic and atraumatic.  Cardiovascular:     Rate and Rhythm: Normal rate and regular rhythm.     Heart sounds: No murmur heard. Pulmonary:     Effort: Pulmonary effort is normal.     Breath sounds: Normal breath sounds.  Abdominal:     General: Bowel sounds are normal. There is no distension.     Palpations: Abdomen is soft.     Tenderness: There is no abdominal tenderness.  Musculoskeletal:     Comments: Non pitting edema to ankles  Skin:    General: Skin is warm and dry.  Neurological:     General: No focal deficit present.     Mental Status: He is alert. Mental status is at baseline.     Labs reviewed: Recent Labs    02/01/24 0501 02/25/24 0000 02/29/24 1948 03/19/24 0935 04/02/24 0000  NA 134*   < > 137 137 138  K 3.9   < > 4.0 4.1 4.5  CL 106   < > 103 103 102  CO2 21*   < > 24 24 23*  GLUCOSE 118*  --  127* 142*  --   BUN 21   < > 22 29* 20  CREATININE 1.31*   < > 1.39* 1.26* 1.2  CALCIUM  7.4*   < > 8.5* 8.9 9.0  MG  --   --  2.2  --   --    < > = values in this interval not displayed.   Recent Labs    12/18/23 1532 01/30/24 1920 02/25/24 0000 02/29/24 1948 04/02/24 0000  AST 11* 24 24 19 20   ALT 7 13 14 10 13   ALKPHOS 46 37* 66 36* 164*  BILITOT 0.4 0.4  --  0.6  --   PROT 5.9* 5.7*  --  5.5*  --   ALBUMIN 3.6 2.9* 3.8 2.9* 3.5   Recent Labs    01/30/24 1920 02/01/24 0501 02/06/24 0000 02/25/24 0000 02/29/24 1948 03/19/24 0935  WBC 8.6 7.9   < > 4.0 8.2 9.2  NEUTROABS 8.0* 6.5  --   --  5.5  --   HGB 12.1* 10.8*   < > 12.3* 10.3* 11.8*  HCT 37.3* 33.4*   < > 37* 31.6* 37.0*  MCV 94.0 93.0  --   --  93.8 95.1  PLT 230 199   < > 238 236 237   < > = values in this  interval not displayed.   Lab Results  Component Value Date   TSH 3.233 04/21/2022   Lab Results  Component Value Date   HGBA1C 6.9 (H) 02/01/2024   Lab Results  Component Value Date   CHOL 96 11/04/2023   HDL 44 11/04/2023   LDLCALC 42 11/04/2023   TRIG 52 11/04/2023   CHOLHDL 2.8 04/21/2022    Significant Diagnostic Results in last 30 days:  No results found.  Assessment/Plan  Left pubic rami fractures with pelvic hematoma Fractures of the left superior and inferior pubic rami with pelvic hematoma. Ambulatory with walker. Orthopedic follow-up indicates stability. Now has moved to skilled care Doing well with ambulation with fall risk due to dementia.    Dementia Mixed AD and Vascular  Chronic cognitive impairment with paranoia and sundowning. No depression per geropsych evaluation; paranoia noted, consider need for antipsychotic medication if symptoms worsen. - Monitor cognitive status and paranoia.  Coronary artery disease with history of NSTEMI and CABG History of NSTEMI and CABG. Recent stable angina with second degree AV block due to PACs. Imdur  discontinued due to adverse effects. - Monitor for angina symptoms. - Continue aspirin  and statin therapy. -Refuses Imdur   -follows with cardiology   Aortic atherosclerosis and abdominal aortic aneurysm Aortic atherosclerosis with abdominal aortic aneurysm measuring 4.2 x 3.5 cm on CT 01/31/24 - Schedule follow-up CT of the abdomen in 12 months. - Consider referral to vascular specialist.  Type 2 diabetes mellitus Type 2 diabetes mellitus with last A1c of 6.9. Farxiga discontinued due to dizziness. Currently on Tradjenta  5 mg. - Repeat A1c in September.  Chronic kidney disease Chronic kidney disease with recent labs showing BUN of 19.6 and creatinine of 1.2.  Hypertension Hypertension currently controlled.  Hyperlipidemia Hyperlipidemia with last LDL of 42. On Zocor .and Tricor   Gastroesophageal reflux disease  (GERD) GERD managed with Prilosec. - Continue Prilosec.

## 2024-05-08 ENCOUNTER — Encounter: Payer: Self-pay | Admitting: Adult Health

## 2024-05-11 DIAGNOSIS — S32810S Multiple fractures of pelvis with stable disruption of pelvic ring, sequela: Secondary | ICD-10-CM | POA: Diagnosis not present

## 2024-05-11 DIAGNOSIS — R42 Dizziness and giddiness: Secondary | ICD-10-CM | POA: Diagnosis not present

## 2024-05-11 DIAGNOSIS — M25552 Pain in left hip: Secondary | ICD-10-CM | POA: Diagnosis not present

## 2024-05-11 DIAGNOSIS — R531 Weakness: Secondary | ICD-10-CM | POA: Diagnosis not present

## 2024-05-11 DIAGNOSIS — Z9181 History of falling: Secondary | ICD-10-CM | POA: Diagnosis not present

## 2024-05-11 DIAGNOSIS — M6281 Muscle weakness (generalized): Secondary | ICD-10-CM | POA: Diagnosis not present

## 2024-05-11 DIAGNOSIS — R2689 Other abnormalities of gait and mobility: Secondary | ICD-10-CM | POA: Diagnosis not present

## 2024-05-11 DIAGNOSIS — R41841 Cognitive communication deficit: Secondary | ICD-10-CM | POA: Diagnosis not present

## 2024-05-12 ENCOUNTER — Non-Acute Institutional Stay (SKILLED_NURSING_FACILITY): Payer: Self-pay | Admitting: Orthopedic Surgery

## 2024-05-12 ENCOUNTER — Encounter: Payer: Self-pay | Admitting: Orthopedic Surgery

## 2024-05-12 DIAGNOSIS — F0280 Dementia in other diseases classified elsewhere without behavioral disturbance: Secondary | ICD-10-CM

## 2024-05-12 DIAGNOSIS — R600 Localized edema: Secondary | ICD-10-CM

## 2024-05-12 DIAGNOSIS — G309 Alzheimer's disease, unspecified: Secondary | ICD-10-CM | POA: Diagnosis not present

## 2024-05-12 DIAGNOSIS — F0150 Vascular dementia without behavioral disturbance: Secondary | ICD-10-CM

## 2024-05-12 NOTE — Progress Notes (Unsigned)
 [facility] Room Number: 140-P Place of Service:  SNF [phone]) Provider:  Greig Cluster, NP  PCP: Charlanne Fredia CROME, MD  Patient Care Team: Charlanne Fredia CROME, MD as PCP - General (Internal Medicine) [place], Peter M, MD as PCP - Cardiology (Cardiology) Tat, Asberry RAMAN, DO as Attending Physician (Neurology) [place], Peter M, MD as Attending Physician (Cardiology) Twana Jeneal DASEN, MD (Hematology and Oncology) Rosalie Kitchens, MD as Attending Physician (Gastroenterology)  Extended Emergency Contact Information Primary Emergency Contact: Klinker,doug Mobile Phone: [phone] Relation: Son Secondary Emergency Contact: [facility] Phone: [phone] Relation: Daughter  Code Status:  DNR Goals of care: Advanced Directive information    05/12/2024    3:01 PM  Advanced Directives  Does Patient Have a Medical Advance Directive? Yes  Type of Advance Directive Out of facility DNR (pink MOST or yellow form)  Does patient want to make changes to medical advance directive? No - Patient declined     Chief Complaint  Patient presents with   Leg Swelling    Lower leg swelling    HPI:  Pt is a 89 y.o. male seen today for an acute visit for    Past Medical History:  Diagnosis Date   Cancer (HCC) 09/10/2002   prostate   Chronic kidney disease, stage 3b (HCC)    Coronary artery disease    Depression    Diabetes mellitus type 2 in obese    Dyslipidemia    Esophageal stricture    Monoclonal paraproteinemia 03/03/2013   Palpitations    RBBB (right bundle branch block)    Past Surgical History:  Procedure Laterality Date   ANKLE SURGERY Left    APPENDECTOMY     CARDIAC CATHETERIZATION     Dr. [place]   CATARACT EXTRACTION W/ INTRAOCULAR LENS  IMPLANT, BILATERAL     CORONARY ARTERY BYPASS GRAFT  Oct. 2008   x 5,Dr.Gerhardt   CORONARY/GRAFT ACUTE MI REVASCULARIZATION N/A 04/22/2022   Procedure: Coronary/Graft Acute MI Revascularization;  Surgeon:  [place], Peter M, MD;  Location: [facility] INVASIVE CV LAB;  Service: Cardiovascular;  Laterality: N/A;   HERNIA REPAIR Bilateral    LEFT HEART CATH AND CORONARY ANGIOGRAPHY N/A 04/22/2022   Procedure: LEFT HEART CATH AND CORONARY ANGIOGRAPHY;  Surgeon: [place], Peter M, MD;  Location: [facility] INVASIVE CV LAB;  Service: Cardiovascular;  Laterality: N/A;   PROSTATECTOMY  2004   radical   right shoulder      replacement   vein strippping      Allergies  Allergen Reactions   Other Itching     Mepergan Fortis, (meprozine)  = itching (patient disputes this in 2023)    Allergies as of 05/12/2024       Reactions   Other Itching    Mepergan Fortis, (meprozine)  = itching (patient disputes this in 2023)        Medication List        Accurate as of May 12, 2024  3:01 PM. If you have any questions, ask your nurse or doctor.          acetaminophen  325 MG tablet Commonly known as: TYLENOL  Take 650 mg by mouth every 4 (four) hours as needed for fever or headache.   aspirin  EC 81 MG tablet Take 1 tablet (81 mg total) by mouth daily. Swallow whole.   bismuth subsalicylate 262 MG chewable tablet Commonly known as: PEPTO BISMOL Chew 262 mg by mouth as needed. Give 15 ml orally every 8  hours as needed for Loose Stool   docusate sodium  100 MG capsule Commonly known as: COLACE Take 100 mg by mouth 2 (two) times daily.   fenofibrate  48 MG tablet Commonly known as: Tricor  Take 1 tablet (48 mg total) by mouth daily.   loratadine 10 MG tablet Commonly known as: CLARITIN Take 10 mg by mouth daily.   meclizine  12.5 MG tablet Commonly known as: ANTIVERT  Take 1 tablet (12.5 mg total) by mouth 3 (three) times daily as needed for dizziness.   nitroGLYCERIN  0.4 MG SL tablet Commonly known as: NITROSTAT  Place 1 tablet (0.4 mg total) under the tongue every 5 (five) minutes as needed for chest pain.   omeprazole  20 MG capsule Commonly known as: PRILOSEC Take 1 capsule (20 mg total) by mouth  daily.   ondansetron  4 MG tablet Commonly known as: ZOFRAN  Take 4 mg by mouth every 8 (eight) hours as needed for nausea or vomiting.   polyethylene glycol 17 g packet Commonly known as: MIRALAX  / GLYCOLAX  Take 17 g by mouth as needed for mild constipation, moderate constipation or severe constipation.   PreserVision AREDS 2 Caps Take 1 capsule by mouth in the morning and at bedtime.   simvastatin  20 MG tablet Commonly known as: ZOCOR  Take 20 mg by mouth at bedtime.   Tradjenta  5 MG Tabs tablet Generic drug: linagliptin  Take 5 mg by mouth daily.   VITAMIN B-12 PO Take 5,000 mcg by mouth at bedtime.        Review of Systems  Immunization History  Administered Date(s) Administered   Influenza, Quadrivalent, Recombinant, Inj, Pf 05/06/2018, 06/05/2019, 06/21/2020, 06/05/2021, 05/30/2023   Influenza-Unspecified 06/05/2021, 07/02/2023   Moderna Sars-Covid-2 Vaccination 09/22/2019, 10/20/2019, 07/26/2020, 06/27/2021   PNEUMOCOCCAL CONJUGATE-20 06/29/2014   Pneumococcal Conjugate-13 06/21/2013   Pneumococcal-Unspecified 08/12/2013   Td 07/29/2009, 07/29/2013   Tetanus 11/06/2023   Zoster Recombinant(Shingrix) 01/20/2013   Pertinent  Health Maintenance Due  Topic Date Due   INFLUENZA VACCINE  04/10/2024   HEMOGLOBIN A1C  08/03/2024   FOOT EXAM  11/03/2024   OPHTHALMOLOGY EXAM  11/25/2024      04/21/2022    9:00 AM 04/22/2022    7:45 AM 04/22/2022   10:00 PM 04/23/2022   10:00 AM 07/23/2023    2:39 PM  Fall Risk  Falls in the past year?     0  Was there an injury with Fall?     0  Fall Risk Category Calculator     0  (RETIRED) Patient Fall Risk Level High fall risk  High fall risk  High fall risk  High fall risk    Patient at Risk for Falls Due to     Impaired balance/gait;Impaired mobility  Fall risk Follow up     Falls evaluation completed     Data saved with a previous flowsheet row definition   Functional Status Survey:    Vitals:   05/12/24 1451  BP: (!)  150/80  Pulse: 91  Resp: 18  Temp: (!) 97.1 F (36.2 C)  SpO2: 97%  Weight: 161 lb 12.8 oz (73.4 kg)  Height: 5' 7 (1.702 m)   Body mass index is 25.34 kg/m. Physical Exam  Labs reviewed: Recent Labs    02/01/24 0501 02/25/24 0000 02/29/24 1948 03/19/24 0935 04/02/24 0000  NA 134*   < > 137 137 138  K 3.9   < > 4.0 4.1 4.5  CL 106   < > 103 103 102  CO2 21*   < >  24 24 23*  GLUCOSE 118*  --  127* 142*  --   BUN 21   < > 22 29* 20  CREATININE 1.31*   < > 1.39* 1.26* 1.2  CALCIUM  7.4*   < > 8.5* 8.9 9.0  MG  --   --  2.2  --   --    < > = values in this interval not displayed.   Recent Labs    12/18/23 1532 01/30/24 1920 02/25/24 0000 02/29/24 1948 04/02/24 0000  AST 11* 24 24 19 20   ALT 7 13 14 10 13   ALKPHOS 46 37* 66 36* 164*  BILITOT 0.4 0.4  --  0.6  --   PROT 5.9* 5.7*  --  5.5*  --   ALBUMIN 3.6 2.9* 3.8 2.9* 3.5   Recent Labs    01/30/24 1920 02/01/24 0501 02/06/24 0000 02/25/24 0000 02/29/24 1948 03/19/24 0935  WBC 8.6 7.9   < > 4.0 8.2 9.2  NEUTROABS 8.0* 6.5  --   --  5.5  --   HGB 12.1* 10.8*   < > 12.3* 10.3* 11.8*  HCT 37.3* 33.4*   < > 37* 31.6* 37.0*  MCV 94.0 93.0  --   --  93.8 95.1  PLT 230 199   < > 238 236 237   < > = values in this interval not displayed.   Lab Results  Component Value Date   TSH 3.233 04/21/2022   Lab Results  Component Value Date   HGBA1C 6.9 (H) 02/01/2024   Lab Results  Component Value Date   CHOL 96 11/04/2023   HDL 44 11/04/2023   LDLCALC 42 11/04/2023   TRIG 52 11/04/2023   CHOLHDL 2.8 04/21/2022    Significant Diagnostic Results in last 30 days:  No results found.  Assessment/Plan There are no diagnoses linked to this encounter.   Family/ staff Communication:   Labs/tests ordered:

## 2024-05-18 ENCOUNTER — Other Ambulatory Visit: Payer: Self-pay | Admitting: Adult Health

## 2024-05-18 MED ORDER — AMOXICILLIN 500 MG PO TABS
2000.0000 mg | ORAL_TABLET | Freq: Every day | ORAL | Status: AC | PRN
Start: 2024-05-18 — End: ?

## 2024-05-25 ENCOUNTER — Non-Acute Institutional Stay (SKILLED_NURSING_FACILITY): Payer: Self-pay | Admitting: Adult Health

## 2024-05-25 ENCOUNTER — Encounter: Payer: Self-pay | Admitting: Adult Health

## 2024-05-25 DIAGNOSIS — R42 Dizziness and giddiness: Secondary | ICD-10-CM | POA: Diagnosis not present

## 2024-05-25 DIAGNOSIS — F0280 Dementia in other diseases classified elsewhere without behavioral disturbance: Secondary | ICD-10-CM | POA: Diagnosis not present

## 2024-05-25 DIAGNOSIS — K5901 Slow transit constipation: Secondary | ICD-10-CM

## 2024-05-25 DIAGNOSIS — G309 Alzheimer's disease, unspecified: Secondary | ICD-10-CM

## 2024-05-25 DIAGNOSIS — J310 Chronic rhinitis: Secondary | ICD-10-CM | POA: Diagnosis not present

## 2024-05-25 DIAGNOSIS — F0150 Vascular dementia without behavioral disturbance: Secondary | ICD-10-CM

## 2024-05-25 DIAGNOSIS — I10 Essential (primary) hypertension: Secondary | ICD-10-CM | POA: Diagnosis not present

## 2024-05-25 NOTE — Progress Notes (Unsigned)
 [facility] Room Number: 140 P Place of Service:  SNF [phone]) Provider:  Tawni America, NP    Patient Care Team: Charlanne Fredia CROME, MD as PCP - General (Internal Medicine) [place], Peter M, MD as PCP - Cardiology (Cardiology) Tat, Asberry RAMAN, DO as Attending Physician (Neurology) [place], Peter M, MD as Attending Physician (Cardiology) Twana Jeneal DASEN, MD (Hematology and Oncology) Rosalie Kitchens, MD as Attending Physician (Gastroenterology)  Extended Emergency Contact Information Primary Emergency Contact: Kuhrt,doug Mobile Phone: [phone] Relation: Son Secondary Emergency Contact: [facility] Phone: [phone] Relation: Daughter  Code Status:  DNR Goals of care: Advanced Directive information    05/12/2024    3:01 PM  Advanced Directives  Does Patient Have a Medical Advance Directive? Yes  Type of Advance Directive Out of facility DNR (pink MOST or yellow form)  Does patient want to make changes to medical advance directive? No - Patient declined     Chief Complaint  Patient presents with   Dizziness    HPI:  Pt is a 89 y.o. male seen today for an acute visit for dizziness.   One day ago he reported dizziness which resolved with meclizine . This is an ongoing issue CT of the head 03/19/24 showed no acute findings, with atrophy and microvascular disease.  He is more confused, becomes fixated on things, and moves from one complaint to the next.  He has tried zoloft which led to slightly low sodium and weakness and so it was stopped. He also refused wellbutrin.   He did report constipation and requested a change in meds.   Also reported a chronic nasal drip. No sore throat or cough. Reports chronic wet vocal quality. On Prilosec due to intermittent complaints of chest pain and reflux. Has been evaluated with no acute ST changes on EKG in the ED. No chest pain present at this time. Seems anxious at times.   Past Medical  History:  Diagnosis Date   Cancer (HCC) 09/10/2002   prostate   Chronic kidney disease, stage 3b (HCC)    Coronary artery disease    Depression    Diabetes mellitus type 2 in obese    Dyslipidemia    Esophageal stricture    Monoclonal paraproteinemia 03/03/2013   Palpitations    RBBB (right bundle branch block)    Past Surgical History:  Procedure Laterality Date   ANKLE SURGERY Left    APPENDECTOMY     CARDIAC CATHETERIZATION     Dr. [place]   CATARACT EXTRACTION W/ INTRAOCULAR LENS  IMPLANT, BILATERAL     CORONARY ARTERY BYPASS GRAFT  Oct. 2008   x 5,Dr.Gerhardt   CORONARY/GRAFT ACUTE MI REVASCULARIZATION N/A 04/22/2022   Procedure: Coronary/Graft Acute MI Revascularization;  Surgeon: [place], Peter M, MD;  Location: [facility] INVASIVE CV LAB;  Service: Cardiovascular;  Laterality: N/A;   HERNIA REPAIR Bilateral    LEFT HEART CATH AND CORONARY ANGIOGRAPHY N/A 04/22/2022   Procedure: LEFT HEART CATH AND CORONARY ANGIOGRAPHY;  Surgeon: [place], Peter M, MD;  Location: [facility] INVASIVE CV LAB;  Service: Cardiovascular;  Laterality: N/A;   PROSTATECTOMY  2004   radical   right shoulder      replacement   vein strippping      Allergies  Allergen Reactions   Other Itching     Mepergan Fortis, (meprozine)  = itching (patient disputes this in 2023)    Outpatient Encounter Medications as of 05/25/2024  Medication Sig   acetaminophen  (TYLENOL ) 325 MG tablet Take  650 mg by mouth every 4 (four) hours as needed for fever or headache.   amoxicillin  (AMOXIL ) 500 MG tablet Take 4 tablets (2,000 mg total) by mouth daily as needed. Give 1 hr prior to dental procedure   aspirin  EC 81 MG tablet Take 1 tablet (81 mg total) by mouth daily. Swallow whole.   bismuth subsalicylate (PEPTO BISMOL) 262 MG chewable tablet Chew 262 mg by mouth every 8 (eight) hours as needed.   Cyanocobalamin (VITAMIN B-12 PO) Take 5,000 mcg by mouth at bedtime.   docusate sodium  (COLACE) 100 MG capsule Take 100 mg by mouth 2  (two) times daily.   fenofibrate  (TRICOR ) 48 MG tablet Take 1 tablet (48 mg total) by mouth daily.   loratadine (CLARITIN) 10 MG tablet Take 10 mg by mouth daily.   meclizine  (ANTIVERT ) 12.5 MG tablet Take 1 tablet (12.5 mg total) by mouth 3 (three) times daily as needed for dizziness.   Misc Natural Products (TURMERIC, CURCUMIN,) CAPS Take 1 capsule by mouth 3 (three) times daily as needed. Give 1 capsule by mouth as needed for Supplement Take 1 capsule 3 times daily, preferably with meals. Capsules may be opened and prepared as a tea.   Multiple Vitamins-Minerals (PRESERVISION AREDS 2) CAPS Take 1 capsule by mouth in the morning and at bedtime.   nitroGLYCERIN  (NITROSTAT ) 0.4 MG SL tablet Place 1 tablet (0.4 mg total) under the tongue every 5 (five) minutes as needed for chest pain.   omeprazole  (PRILOSEC) 20 MG capsule Take 1 capsule (20 mg total) by mouth daily.   ondansetron  (ZOFRAN ) 4 MG tablet Take 4 mg by mouth every 8 (eight) hours as needed for nausea or vomiting.   polyethylene glycol (MIRALAX  / GLYCOLAX ) 17 g packet Take 17 g by mouth as needed for mild constipation, moderate constipation or severe constipation.   simvastatin  (ZOCOR ) 20 MG tablet Take 20 mg by mouth at bedtime.     TRADJENTA  5 MG TABS tablet Take 5 mg by mouth daily.   No facility-administered encounter medications on file as of 05/25/2024.    Review of Systems  Constitutional:  Negative for activity change, appetite change, chills, diaphoresis, fatigue and fever.  HENT:  Positive for congestion, postnasal drip and rhinorrhea.   Respiratory:  Negative for cough, shortness of breath and wheezing.   Cardiovascular:  Negative for chest pain and leg swelling.  Gastrointestinal:  Negative for abdominal distention, abdominal pain, constipation, diarrhea, nausea and vomiting.  Genitourinary:  Negative for difficulty urinating, dysuria and urgency.  Musculoskeletal:  Positive for gait problem. Negative for back pain,  myalgias and neck pain.  Skin:  Negative for rash.  Neurological:  Positive for dizziness. Negative for weakness.  Psychiatric/Behavioral:  Positive for confusion. The patient is nervous/anxious.     Immunization History  Administered Date(s) Administered   Influenza, Quadrivalent, Recombinant, Inj, Pf 05/06/2018, 06/05/2019, 06/21/2020, 06/05/2021, 05/30/2023   Influenza-Unspecified 06/05/2021, 07/02/2023   Moderna Sars-Covid-2 Vaccination 09/22/2019, 10/20/2019, 07/26/2020, 06/27/2021   PNEUMOCOCCAL CONJUGATE-20 06/29/2014   Pneumococcal Conjugate-13 06/21/2013   Pneumococcal-Unspecified 08/12/2013   Td 07/29/2009, 07/29/2013   Tetanus 11/06/2023   Zoster Recombinant(Shingrix) 01/20/2013   Pertinent  Health Maintenance Due  Topic Date Due   Influenza Vaccine  04/10/2024   HEMOGLOBIN A1C  08/03/2024   FOOT EXAM  11/03/2024   OPHTHALMOLOGY EXAM  11/25/2024      04/22/2022    7:45 AM 04/22/2022   10:00 PM 04/23/2022   10:00 AM 07/23/2023    2:39 PM 05/13/2024  10:00 AM  Fall Risk  Falls in the past year?    0 1  Was there an injury with Fall?    0 1  Fall Risk Category Calculator    0 3  (RETIRED) Patient Fall Risk Level High fall risk  High fall risk  High fall risk     Patient at Risk for Falls Due to    Impaired balance/gait;Impaired mobility History of fall(s);Impaired balance/gait;Impaired mobility  Fall risk Follow up    Falls evaluation completed Falls evaluation completed;Education provided     Data saved with a previous flowsheet row definition   Functional Status Survey:    Vitals:   05/25/24 0913  BP: (!) 176/98  Pulse: 83  Resp: 18  Temp: (!) 97.4 F (36.3 C)  SpO2: 96%  Weight: 161 lb 9.6 oz (73.3 kg)  Height: 5' 7 (1.702 m)   Body mass index is 25.31 kg/m. Physical Exam Vitals reviewed.  Constitutional:      Appearance: Normal appearance.  HENT:     Head: Normocephalic and atraumatic.     Right Ear: Tympanic membrane normal.     Left Ear:  Tympanic membrane normal.     Nose: Congestion present.     Mouth/Throat:     Mouth: Mucous membranes are moist.     Pharynx: Oropharynx is clear. No oropharyngeal exudate.  Eyes:     Extraocular Movements: Extraocular movements intact.     Conjunctiva/sclera: Conjunctivae normal.     Pupils: Pupils are equal, round, and reactive to light.  Cardiovascular:     Rate and Rhythm: Normal rate and regular rhythm.     Heart sounds: No murmur heard. Pulmonary:     Effort: Pulmonary effort is normal. No respiratory distress.     Breath sounds: Normal breath sounds.  Abdominal:     General: Bowel sounds are normal. There is no distension.     Palpations: Abdomen is soft.     Tenderness: There is no abdominal tenderness.  Musculoskeletal:     Right lower leg: No edema.     Left lower leg: No edema.  Skin:    General: Skin is warm and dry.  Neurological:     General: No focal deficit present.     Mental Status: He is alert. Mental status is at baseline.  Psychiatric:     Comments: anxious     Labs reviewed: Recent Labs    02/01/24 0501 02/25/24 0000 02/29/24 1948 03/19/24 0935 04/02/24 0000  NA 134*   < > 137 137 138  K 3.9   < > 4.0 4.1 4.5  CL 106   < > 103 103 102  CO2 21*   < > 24 24 23*  GLUCOSE 118*  --  127* 142*  --   BUN 21   < > 22 29* 20  CREATININE 1.31*   < > 1.39* 1.26* 1.2  CALCIUM  7.4*   < > 8.5* 8.9 9.0  MG  --   --  2.2  --   --    < > = values in this interval not displayed.   Recent Labs    12/18/23 1532 01/30/24 1920 02/25/24 0000 02/29/24 1948 04/02/24 0000  AST 11* 24 24 19 20   ALT 7 13 14 10 13   ALKPHOS 46 37* 66 36* 164*  BILITOT 0.4 0.4  --  0.6  --   PROT 5.9* 5.7*  --  5.5*  --   ALBUMIN 3.6 2.9* 3.8  2.9* 3.5   Recent Labs    01/30/24 1920 02/01/24 0501 02/06/24 0000 02/25/24 0000 02/29/24 1948 03/19/24 0935  WBC 8.6 7.9   < > 4.0 8.2 9.2  NEUTROABS 8.0* 6.5  --   --  5.5  --   HGB 12.1* 10.8*   < > 12.3* 10.3* 11.8*  HCT  37.3* 33.4*   < > 37* 31.6* 37.0*  MCV 94.0 93.0  --   --  93.8 95.1  PLT 230 199   < > 238 236 237   < > = values in this interval not displayed.   Lab Results  Component Value Date   TSH 3.233 04/21/2022   Lab Results  Component Value Date   HGBA1C 6.9 (H) 02/01/2024   Lab Results  Component Value Date   CHOL 96 11/04/2023   HDL 44 11/04/2023   LDLCALC 42 11/04/2023   TRIG 52 11/04/2023   CHOLHDL 2.8 04/21/2022    Significant Diagnostic Results in last 30 days:  No results found.  Assessment/Plan  1. Chronic rhinitis (Primary) Signs of pale boggy septum, possible allergies On claritin, add flonase .  - fluticasone  (FLONASE ) 50 MCG/ACT nasal spray; Place 2 sprays into both nostrils daily. X 1 month the 1 spray each nare qd  2. Slow transit constipation  - sennosides-docusate sodium  (SENOKOT-S) 8.6-50 MG tablet; Take 2 tablets by mouth at bedtime.  3. Essential hypertension Need manual bp daily for 1 week and labs  4. Dizziness Resolved  Intermittent issues No ear complaints and normal ear exam Also no focal findings Continue meclizine  prn Check labs   5. Mixed Alzheimer's and vascular dementia (HCC) Periods of perseveration on topics and anxiety  Non amenable to meds at this time.    Labs/tests ordered:  CBC CMP in am

## 2024-05-26 ENCOUNTER — Encounter: Payer: Self-pay | Admitting: Adult Health

## 2024-05-26 DIAGNOSIS — R531 Weakness: Secondary | ICD-10-CM | POA: Diagnosis not present

## 2024-05-26 LAB — HEPATIC FUNCTION PANEL
ALT: 10 U/L (ref 10–40)
AST: 19 (ref 14–40)
Alkaline Phosphatase: 66 (ref 25–125)
Bilirubin, Total: 0.4

## 2024-05-26 LAB — COMPREHENSIVE METABOLIC PANEL WITH GFR
Albumin: 3.8 (ref 3.5–5.0)
Calcium: 9.5 (ref 8.7–10.7)
Globulin: 2.3

## 2024-05-26 LAB — CBC AND DIFFERENTIAL
HCT: 37 — AB (ref 41–53)
Hemoglobin: 12.5 — AB (ref 13.5–17.5)
Platelets: 312 K/uL (ref 150–400)
WBC: 6.7

## 2024-05-26 LAB — BASIC METABOLIC PANEL WITH GFR
BUN: 26 — AB (ref 4–21)
CO2: 19 (ref 13–22)
Chloride: 105 (ref 99–108)
Creatinine: 1.2 (ref 0.6–1.3)
Glucose: 154
Potassium: 4.1 meq/L (ref 3.5–5.1)
Sodium: 138 (ref 137–147)

## 2024-05-26 LAB — CBC: RBC: 4.03 (ref 3.87–5.11)

## 2024-05-26 MED ORDER — FLUTICASONE PROPIONATE 50 MCG/ACT NA SUSP: 2.0000 | Freq: Every day | NASAL | Status: DC

## 2024-05-26 MED ORDER — FLUTICASONE PROPIONATE 50 MCG/ACT NA SUSP: 2.0000 | Freq: Every day | NASAL | Status: AC

## 2024-05-26 MED ORDER — SENNA-DOCUSATE SODIUM 8.6-50 MG PO TABS: 2.0000 | ORAL_TABLET | Freq: Every day | ORAL | Status: AC

## 2024-06-01 ENCOUNTER — Encounter: Payer: Self-pay | Admitting: Internal Medicine

## 2024-06-01 ENCOUNTER — Non-Acute Institutional Stay (SKILLED_NURSING_FACILITY): Payer: Self-pay | Admitting: Internal Medicine

## 2024-06-01 DIAGNOSIS — G3184 Mild cognitive impairment, so stated: Secondary | ICD-10-CM

## 2024-06-01 DIAGNOSIS — F32A Depression, unspecified: Secondary | ICD-10-CM

## 2024-06-01 MED ORDER — BUPROPION HCL ER (XL) 150 MG PO TB24: 150.0000 mg | ORAL_TABLET | Freq: Every day | ORAL | Status: DC

## 2024-06-01 NOTE — Progress Notes (Signed)
 Location: Medical illustrator of Service:  SNF (31)  Provider:   Code Status: DNR Goals of Care:     05/12/2024    3:01 PM  Advanced Directives  Does Patient Have a Medical Advance Directive? Yes  Type of Advance Directive Out of facility DNR (pink MOST or yellow form)  Does patient want to make changes to medical advance directive? No - Patient declined     Chief Complaint  Patient presents with   Acute Visit    HPI: Patient is a 89 y.o. male seen today for an acute visit for Depression and Behaviors  Patient is now in SNF in [place]  Patient has been admitted in SNF due to multiple falls Cognition and unstable Gait  He is not happy to be in Skilled care as [place] wife is in [place] He goes to visit her and Constantly complains She wants him seen for Depression Patient continues to be very paranoid.  He wanted to know how long he has to be in skilled.  He said the therapy department is not working him with him.  He continues to be very depressed to be away from his wife.  I had started him on sertraline many months ago and patient got very weak and had mild hyponatremia. Wt Readings from Last 3 Encounters:  06/01/24 161 lb (73 kg)  05/25/24 161 lb 9.6 oz (73.3 kg)  05/12/24 161 lb 12.8 oz (73.4 kg)    Past Medical History:  Diagnosis Date   Cancer (HCC) 09/10/2002   prostate   Chronic kidney disease, stage 3b (HCC)    Coronary artery disease    Depression    Diabetes mellitus type 2 in obese    Dyslipidemia    Esophageal stricture    Monoclonal paraproteinemia 03/03/2013   Palpitations    RBBB (right bundle branch block)     Past Surgical History:  Procedure Laterality Date   ANKLE SURGERY Left    APPENDECTOMY     CARDIAC CATHETERIZATION     Dr. Swaziland   CATARACT EXTRACTION W/ INTRAOCULAR LENS  IMPLANT, BILATERAL     CORONARY ARTERY BYPASS GRAFT  Oct. 2008   x 5,Dr.Gerhardt   CORONARY/GRAFT ACUTE MI REVASCULARIZATION N/A 04/22/2022    Procedure: Coronary/Graft Acute MI Revascularization;  Surgeon: Swaziland, Peter M, MD;  Location: [facility] INVASIVE CV LAB;  Service: Cardiovascular;  Laterality: N/A;   HERNIA REPAIR Bilateral    LEFT HEART CATH AND CORONARY ANGIOGRAPHY N/A 04/22/2022   Procedure: LEFT HEART CATH AND CORONARY ANGIOGRAPHY;  Surgeon: Swaziland, Peter M, MD;  Location: [facility] INVASIVE CV LAB;  Service: Cardiovascular;  Laterality: N/A;   PROSTATECTOMY  2004   radical   right shoulder      replacement   vein strippping      Allergies  Allergen Reactions   Other Itching     Mepergan Fortis, (meprozine)  = itching (patient disputes this in 2023)    Outpatient Encounter Medications as of 06/01/2024  Medication Sig   acetaminophen  (TYLENOL ) 325 MG tablet Take 650 mg by mouth every 4 (four) hours as needed for fever or headache.   amoxicillin  (AMOXIL ) 500 MG tablet Take 4 tablets (2,000 mg total) by mouth daily as needed. Give 1 hr prior to dental procedure   aspirin  EC 81 MG tablet Take 1 tablet (81 mg total) by mouth daily. Swallow whole.   bismuth subsalicylate (PEPTO BISMOL) 262 MG chewable tablet Chew 262 mg by mouth every 8 (  eight) hours as needed.   Cyanocobalamin (VITAMIN B-12 PO) Take 5,000 mcg by mouth at bedtime.   fenofibrate  (TRICOR ) 48 MG tablet Take 1 tablet (48 mg total) by mouth daily.   fluticasone  (FLONASE ) 50 MCG/ACT nasal spray Place 2 sprays into both nostrils daily. X 1 month the 1 spray each nare qd   loratadine (CLARITIN) 10 MG tablet Take 10 mg by mouth daily.   meclizine  (ANTIVERT ) 12.5 MG tablet Take 1 tablet (12.5 mg total) by mouth 3 (three) times daily as needed for dizziness.   Misc Natural Products (TURMERIC, CURCUMIN,) CAPS Take 1 capsule by mouth 3 (three) times daily as needed. Give 1 capsule by mouth as needed for Supplement Take 1 capsule 3 times daily, preferably with meals. Capsules may be opened and prepared as a tea.   Multiple Vitamins-Minerals (PRESERVISION AREDS 2) CAPS Take 1  capsule by mouth in the morning and at bedtime.   nitroGLYCERIN  (NITROSTAT ) 0.4 MG SL tablet Place 1 tablet (0.4 mg total) under the tongue every 5 (five) minutes as needed for chest pain.   omeprazole  (PRILOSEC) 20 MG capsule Take 1 capsule (20 mg total) by mouth daily.   ondansetron  (ZOFRAN ) 4 MG tablet Take 4 mg by mouth every 8 (eight) hours as needed for nausea or vomiting.   polyethylene glycol (MIRALAX  / GLYCOLAX ) 17 g packet Take 17 g by mouth as needed for mild constipation, moderate constipation or severe constipation.   sennosides-docusate sodium  (SENOKOT-S) 8.6-50 MG tablet Take 2 tablets by mouth at bedtime.   simvastatin  (ZOCOR ) 20 MG tablet Take 20 mg by mouth at bedtime.     TRADJENTA  5 MG TABS tablet Take 5 mg by mouth daily.   No facility-administered encounter medications on file as of 06/01/2024.    Review of Systems:  Review of Systems  Constitutional:  Negative for activity change, appetite change and unexpected weight change.  HENT: Negative.    Respiratory:  Negative for cough and shortness of breath.   Cardiovascular:  Negative for leg swelling.  Gastrointestinal:  Negative for constipation.  Genitourinary:  Negative for frequency.  Musculoskeletal:  Positive for gait problem. Negative for arthralgias and myalgias.  Skin: Negative.  Negative for rash.  Neurological:  Negative for dizziness and weakness.  Psychiatric/Behavioral:  Positive for confusion. Negative for sleep disturbance.   All other systems reviewed and are negative.   Health Maintenance  Topic Date Due   Zoster Vaccines- Shingrix (2 of 2) 03/17/2013   Medicare Annual Wellness (AWV)  11/22/2023   Influenza Vaccine  04/10/2024   COVID-19 Vaccine (5 - 2025-26 season) 05/11/2024   HEMOGLOBIN A1C  08/03/2024   FOOT EXAM  11/03/2024   OPHTHALMOLOGY EXAM  11/25/2024   DTaP/Tdap/Td (4 - Tdap) 11/05/2033   Pneumococcal Vaccine: 50+ Years  Completed   HPV VACCINES  Aged Out   Meningococcal B Vaccine   Aged Out    Physical Exam: Vitals:   06/01/24 1458  BP: (!) 152/81  Pulse: 76  Resp: 16  Temp: 97.6 F (36.4 C)  Weight: 161 lb (73 kg)   Body mass index is 25.22 kg/m. Physical Exam Vitals reviewed.  Constitutional:      Appearance: Normal appearance.  HENT:     Head: Normocephalic.     Nose: Nose normal.     Mouth/Throat:     Mouth: Mucous membranes are moist.     Pharynx: Oropharynx is clear.  Eyes:     Pupils: Pupils are equal, round, and reactive  to light.  Cardiovascular:     Pulses: Normal pulses.     Heart sounds: Normal heart sounds.  Pulmonary:     Effort: Pulmonary effort is normal.     Breath sounds: Normal breath sounds.  Musculoskeletal:        General: No swelling.     Cervical back: Neck supple.  Neurological:     General: No focal deficit present.     Mental Status: He is alert.     Labs reviewed: Basic Metabolic Panel: Recent Labs    02/01/24 0501 02/25/24 0000 02/29/24 1948 03/19/24 0935 04/02/24 0000  NA 134*   < > 137 137 138  K 3.9   < > 4.0 4.1 4.5  CL 106   < > 103 103 102  CO2 21*   < > 24 24 23*  GLUCOSE 118*  --  127* 142*  --   BUN 21   < > 22 29* 20  CREATININE 1.31*   < > 1.39* 1.26* 1.2  CALCIUM  7.4*   < > 8.5* 8.9 9.0  MG  --   --  2.2  --   --    < > = values in this interval not displayed.   Liver Function Tests: Recent Labs    12/18/23 1532 01/30/24 1920 02/25/24 0000 02/29/24 1948 04/02/24 0000  AST 11* 24 24 19 20   ALT 7 13 14 10 13   ALKPHOS 46 37* 66 36* 164*  BILITOT 0.4 0.4  --  0.6  --   PROT 5.9* 5.7*  --  5.5*  --   ALBUMIN 3.6 2.9* 3.8 2.9* 3.5   Recent Labs    01/30/24 1920 02/29/24 1948  LIPASE 30 40   No results for input(s): AMMONIA in the last 8760 hours. CBC: Recent Labs    01/30/24 1920 02/01/24 0501 02/06/24 0000 02/25/24 0000 02/29/24 1948 03/19/24 0935  WBC 8.6 7.9   < > 4.0 8.2 9.2  NEUTROABS 8.0* 6.5  --   --  5.5  --   HGB 12.1* 10.8*   < > 12.3* 10.3* 11.8*  HCT  37.3* 33.4*   < > 37* 31.6* 37.0*  MCV 94.0 93.0  --   --  93.8 95.1  PLT 230 199   < > 238 236 237   < > = values in this interval not displayed.   Lipid Panel: Recent Labs    11/04/23 0000  CHOL 96  HDL 44  LDLCALC 42  TRIG 52   Lab Results  Component Value Date   HGBA1C 6.9 (H) 02/01/2024    Procedures since last visit: No results found.  Assessment/Plan 1. Acute depression (Primary) Will start him on Wellbutrin  XL 150 mg QD  2. MCI (mild cognitive impairment) Will Consider Namenda for his behaviors next visit  Labs done recently were good No A1C was done  Labs/tests ordered:  * No order type specified * Next appt:  Visit date not found

## 2024-06-07 ENCOUNTER — Telehealth: Payer: Self-pay | Admitting: Family

## 2024-06-07 DIAGNOSIS — R4182 Altered mental status, unspecified: Secondary | ICD-10-CM | POA: Diagnosis not present

## 2024-06-07 NOTE — Telephone Encounter (Signed)
 Facility Nurse called stated patient fell and hit his head.Patient.current on Aspirin .stated required to send to ED per facility policy but patient has a MOST form indicating not to send to Hospital.Nurse has contacted patient's POA still awaiting call back.Orders given to obtain if patient does not go to ED: CBC/diff,urine for U/A and C/s to r/o UTI Neuro check per facility protocol. Please follow up.

## 2024-06-08 ENCOUNTER — Non-Acute Institutional Stay (SKILLED_NURSING_FACILITY): Payer: Self-pay | Admitting: Internal Medicine

## 2024-06-08 ENCOUNTER — Encounter: Payer: Self-pay | Admitting: Internal Medicine

## 2024-06-08 DIAGNOSIS — F0280 Dementia in other diseases classified elsewhere without behavioral disturbance: Secondary | ICD-10-CM | POA: Diagnosis not present

## 2024-06-08 DIAGNOSIS — G309 Alzheimer's disease, unspecified: Secondary | ICD-10-CM

## 2024-06-08 DIAGNOSIS — F32A Depression, unspecified: Secondary | ICD-10-CM

## 2024-06-08 DIAGNOSIS — R4182 Altered mental status, unspecified: Secondary | ICD-10-CM | POA: Diagnosis not present

## 2024-06-08 DIAGNOSIS — R296 Repeated falls: Secondary | ICD-10-CM | POA: Diagnosis not present

## 2024-06-08 DIAGNOSIS — F0150 Vascular dementia without behavioral disturbance: Secondary | ICD-10-CM

## 2024-06-08 MED ORDER — MEMANTINE HCL 5 MG PO TABS: 5.0000 mg | ORAL_TABLET | Freq: Two times a day (BID) | ORAL | Status: DC

## 2024-06-08 NOTE — Progress Notes (Signed)
 Location:  [facility] Room Number: 140-P Place of Service:  SNF [phone]) Provider:  Charlanne Fredia CROME, MD  Patient Care Team: Charlanne Fredia CROME, MD as PCP - General (Internal Medicine) [place]azil[place], Peter M, MD as PCP - Cardiology (Cardiology) Tat, Asberry RAMAN, DO as Attending Physician (Neurology) [place], Peter M, MD as Attending Physician (Cardiology) Twana Jeneal DASEN, MD (Hematology and Oncology) Rosalie Kitchens, MD as Attending Physician (Gastroenterology)  Extended Emergency Contact Information Primary Emergency Contact: Pontarelli,doug Mobile Phone: [phone] Relation: Son Secondary Emergency Contact: [facility] Phone: [phone] Relation: Daughter  Code Status:  DNR Goals of care: Advanced Directive information    06/08/2024   10:51 AM  Advanced Directives  Does Patient Have a Medical Advance Directive? Yes  Type of Advance Directive Out of facility DNR (pink MOST or yellow form)  Does patient want to make changes to medical advance directive? No - Patient declined     Chief Complaint  Patient presents with   Fall    falls    HPI:  Pt is a 89 y.o. male seen today for an acute visit for Falls, Depression and Behaviors  Patient is now in SNF in [place]   Patient has been admitted in SNF due to multiple falls Cognition and unstable Gait  He has had 3 falls in past few days Also has been more confused and Paranoid Most of the falls are when he forgets to call for help and Legs give away and he falls He was started on Wellbutrin  a week ago Stat Labs done did not  show anything concerning He was responsive but then gets confused in between For me looks as his Normal Baseline  Therapy is now not letting him use his power chair Ans he was ok with that He is Do not hospitalize        Past Medical History:  Diagnosis Date   Cancer (HCC) 09/10/2002   prostate   Chronic kidney disease, stage 3b (HCC)    Coronary artery disease     Depression    Diabetes mellitus type 2 in obese    Dyslipidemia    Esophageal stricture    Monoclonal paraproteinemia 03/03/2013   Palpitations    RBBB (right bundle branch block)    Past Surgical History:  Procedure Laterality Date   ANKLE SURGERY Left    APPENDECTOMY     CARDIAC CATHETERIZATION     Dr. Swaziland   CATARACT EXTRACTION W/ INTRAOCULAR LENS  IMPLANT, BILATERAL     CORONARY ARTERY BYPASS GRAFT  Oct. 2008   x 5,Dr.Gerhardt   CORONARY/GRAFT ACUTE MI REVASCULARIZATION N/A 04/22/2022   Procedure: Coronary/Graft Acute MI Revascularization;  Surgeon: Swaziland, Peter M, MD;  Location: [facility] INVASIVE CV LAB;  Service: Cardiovascular;  Laterality: N/A;   HERNIA REPAIR Bilateral    LEFT HEART CATH AND CORONARY ANGIOGRAPHY N/A 04/22/2022   Procedure: LEFT HEART CATH AND CORONARY ANGIOGRAPHY;  Surgeon: Swaziland, Peter M, MD;  Location: [facility] INVASIVE CV LAB;  Service: Cardiovascular;  Laterality: N/A;   PROSTATECTOMY  2004   radical   right shoulder      replacement   vein strippping      Allergies  Allergen Reactions   Other Itching     Mepergan Fortis, (meprozine)  = itching (patient disputes this in 2023)    Outpatient Encounter Medications as of 06/08/2024  Medication Sig   acetaminophen  (TYLENOL ) 325 MG tablet Take 650 mg by mouth every 4 (four) hours as needed for  fever or headache.   amoxicillin  (AMOXIL ) 500 MG tablet Take 4 tablets (2,000 mg total) by mouth daily as needed. Give 1 hr prior to dental procedure   aspirin  EC 81 MG tablet Take 1 tablet (81 mg total) by mouth daily. Swallow whole.   bismuth subsalicylate (PEPTO BISMOL) 262 MG chewable tablet Chew 262 mg by mouth every 8 (eight) hours as needed.   buPROPion  (WELLBUTRIN  XL) 150 MG 24 hr tablet Take 1 tablet (150 mg total) by mouth daily.   Cyanocobalamin (VITAMIN B-12 PO) Take 5,000 mcg by mouth at bedtime.   fenofibrate  (TRICOR ) 48 MG tablet Take 1 tablet (48 mg total) by mouth daily.   fluticasone  (FLONASE ) 50  MCG/ACT nasal spray Place 2 sprays into both nostrils daily. X 1 month the 1 spray each nare qd   loratadine (CLARITIN) 10 MG tablet Take 10 mg by mouth daily.   meclizine  (ANTIVERT ) 12.5 MG tablet Take 1 tablet (12.5 mg total) by mouth 3 (three) times daily as needed for dizziness.   Misc Natural Products (TURMERIC, CURCUMIN,) CAPS Take 1 capsule by mouth 3 (three) times daily as needed. Give 1 capsule by mouth as needed for Supplement Take 1 capsule 3 times daily, preferably with meals. Capsules may be opened and prepared as a tea.   Multiple Vitamins-Minerals (PRESERVISION AREDS 2) CAPS Take 1 capsule by mouth in the morning and at bedtime.   nitroGLYCERIN  (NITROSTAT ) 0.4 MG SL tablet Place 1 tablet (0.4 mg total) under the tongue every 5 (five) minutes as needed for chest pain.   nystatin (MYCOSTATIN/NYSTOP) powder Apply 1 Application topically 2 (two) times daily.   omeprazole  (PRILOSEC) 20 MG capsule Take 1 capsule (20 mg total) by mouth daily.   ondansetron  (ZOFRAN ) 4 MG tablet Take 4 mg by mouth every 8 (eight) hours as needed for nausea or vomiting.   polyethylene glycol (MIRALAX  / GLYCOLAX ) 17 g packet Take 17 g by mouth as needed for mild constipation, moderate constipation or severe constipation.   sennosides-docusate sodium  (SENOKOT-S) 8.6-50 MG tablet Take 2 tablets by mouth at bedtime.   simvastatin  (ZOCOR ) 20 MG tablet Take 20 mg by mouth at bedtime.     TRADJENTA  5 MG TABS tablet Take 5 mg by mouth daily.   No facility-administered encounter medications on file as of 06/08/2024.    Review of Systems  Constitutional:  Positive for activity change. Negative for appetite change and unexpected weight change.  HENT: Negative.    Respiratory:  Negative for cough and shortness of breath.   Cardiovascular:  Negative for leg swelling.  Gastrointestinal:  Negative for constipation.  Genitourinary:  Negative for frequency.  Musculoskeletal:  Positive for gait problem. Negative for  arthralgias and myalgias.  Skin: Negative.  Negative for rash.  Neurological:  Positive for weakness. Negative for dizziness.  Psychiatric/Behavioral:  Positive for confusion. Negative for sleep disturbance.   All other systems reviewed and are negative.   Immunization History  Administered Date(s) Administered   Influenza, Quadrivalent, Recombinant, Inj, Pf 05/06/2018, 06/05/2019, 06/21/2020, 06/05/2021, 05/30/2023   Influenza-Unspecified 06/05/2021, 07/02/2023   Moderna Sars-Covid-2 Vaccination 09/22/2019, 10/20/2019, 07/26/2020, 06/27/2021   PNEUMOCOCCAL CONJUGATE-20 06/29/2014   Pneumococcal Conjugate-13 06/21/2013   Pneumococcal-Unspecified 08/12/2013   Td 07/29/2009, 07/29/2013   Tetanus 11/06/2023   Zoster Recombinant(Shingrix) 01/20/2013   Pertinent  Health Maintenance Due  Topic Date Due   Influenza Vaccine  04/10/2024   HEMOGLOBIN A1C  08/03/2024   FOOT EXAM  11/03/2024   OPHTHALMOLOGY EXAM  11/25/2024  04/22/2022    7:45 AM 04/22/2022   10:00 PM 04/23/2022   10:00 AM 07/23/2023    2:39 PM 05/13/2024   10:00 AM  Fall Risk  Falls in the past year?    0 1  Was there an injury with Fall?    0 1  Fall Risk Category Calculator    0 3  (RETIRED) Patient Fall Risk Level High fall risk  High fall risk  High fall risk     Patient at Risk for Falls Due to    Impaired balance/gait;Impaired mobility History of fall(s);Impaired balance/gait;Impaired mobility  Fall risk Follow up    Falls evaluation completed Falls evaluation completed;Education provided     Data saved with a previous flowsheet row definition   Functional Status Survey:    Vitals:   06/08/24 1048  BP: 128/73  Pulse: 97  Resp: (!) 22  Temp: 97.6 F (36.4 C)  SpO2: 97%  Weight: 161 lb 9.6 oz (73.3 kg)  Height: 5' 7 (1.702 m)   Body mass index is 25.31 kg/m. Physical Exam Vitals reviewed.  Constitutional:      Appearance: Normal appearance.  HENT:     Head: Normocephalic.     Nose: Nose  normal.     Mouth/Throat:     Mouth: Mucous membranes are moist.     Pharynx: Oropharynx is clear.  Eyes:     Pupils: Pupils are equal, round, and reactive to light.  Cardiovascular:     Rate and Rhythm: Normal rate and regular rhythm.     Pulses: Normal pulses.     Heart sounds: No murmur heard. Pulmonary:     Effort: Pulmonary effort is normal. No respiratory distress.     Breath sounds: Normal breath sounds. No rales.  Abdominal:     General: Abdomen is flat. Bowel sounds are normal.     Palpations: Abdomen is soft.  Musculoskeletal:        General: No swelling.     Cervical back: Neck supple.  Skin:    General: Skin is warm.  Neurological:     Mental Status: He is alert.     Comments: Patient recognized me Remember that he fell Again told me he does not want his life prolong  Psychiatric:        Mood and Affect: Mood normal.        Thought Content: Thought content normal.     Labs reviewed: Recent Labs    02/01/24 0501 02/25/24 0000 02/29/24 1948 03/19/24 0935 04/02/24 0000  NA 134*   < > 137 137 138  K 3.9   < > 4.0 4.1 4.5  CL 106   < > 103 103 102  CO2 21*   < > 24 24 23*  GLUCOSE 118*  --  127* 142*  --   BUN 21   < > 22 29* 20  CREATININE 1.31*   < > 1.39* 1.26* 1.2  CALCIUM  7.4*   < > 8.5* 8.9 9.0  MG  --   --  2.2  --   --    < > = values in this interval not displayed.   Recent Labs    12/18/23 1532 01/30/24 1920 02/25/24 0000 02/29/24 1948 04/02/24 0000  AST 11* 24 24 19 20   ALT 7 13 14 10 13   ALKPHOS 46 37* 66 36* 164*  BILITOT 0.4 0.4  --  0.6  --   PROT 5.9* 5.7*  --  5.5*  --  ALBUMIN 3.6 2.9* 3.8 2.9* 3.5   Recent Labs    01/30/24 1920 02/01/24 0501 02/06/24 0000 02/25/24 0000 02/29/24 1948 03/19/24 0935  WBC 8.6 7.9   < > 4.0 8.2 9.2  NEUTROABS 8.0* 6.5  --   --  5.5  --   HGB 12.1* 10.8*   < > 12.3* 10.3* 11.8*  HCT 37.3* 33.4*   < > 37* 31.6* 37.0*  MCV 94.0 93.0  --   --  93.8 95.1  PLT 230 199   < > 238 236 237   <  > = values in this interval not displayed.   Lab Results  Component Value Date   TSH 3.233 04/21/2022   Lab Results  Component Value Date   HGBA1C 6.9 (H) 02/01/2024   Lab Results  Component Value Date   CHOL 96 11/04/2023   HDL 44 11/04/2023   LDLCALC 42 11/04/2023   TRIG 52 11/04/2023   CHOLHDL 2.8 04/21/2022    Significant Diagnostic Results in last 30 days:  No results found.  Assessment/Plan 1. Recurrent falls (Primary) Labs done were all WNL Most likely cause is multifactorial with Proximal muscle weakness and Cognitive impairment   2. Mixed Alzheimer's and vascular dementia (HCC) Worsening Cognition since starting Wellbutrin  Will Discontinue it Will try Namenda to see if it helps with his behaviors of Paranoia   3. Acute depression Has failed Zoloft and Wellbutrin  due to worsening confusion Will Consider Remeron but hold off till patient get used to Namenda    Family/ staff Communication:  Labs/tests ordered:

## 2024-06-09 ENCOUNTER — Encounter: Payer: Self-pay | Admitting: Internal Medicine

## 2024-06-09 ENCOUNTER — Non-Acute Institutional Stay (SKILLED_NURSING_FACILITY): Payer: Self-pay | Admitting: Internal Medicine

## 2024-06-09 DIAGNOSIS — F0280 Dementia in other diseases classified elsewhere without behavioral disturbance: Secondary | ICD-10-CM

## 2024-06-09 DIAGNOSIS — R296 Repeated falls: Secondary | ICD-10-CM

## 2024-06-09 DIAGNOSIS — G309 Alzheimer's disease, unspecified: Secondary | ICD-10-CM

## 2024-06-09 DIAGNOSIS — F32A Depression, unspecified: Secondary | ICD-10-CM | POA: Diagnosis not present

## 2024-06-09 DIAGNOSIS — F0150 Vascular dementia without behavioral disturbance: Secondary | ICD-10-CM | POA: Diagnosis not present

## 2024-06-09 NOTE — Progress Notes (Unsigned)
 Location:  [facility] Room Number: 140 P Place of Service:  SNF [phone]) Provider:  Charlanne Fredia CROME, MD  Patient Care Team: Charlanne Fredia CROME, MD as PCP - General (Internal Medicine) [place], Peter M, MD as PCP - Cardiology (Cardiology) Tat, Asberry RAMAN, DO as Attending Physician (Neurology) [place], Peter M, MD as Attending Physician (Cardiology) Twana Jeneal DASEN, MD (Hematology and Oncology) Rosalie Kitchens, MD as Attending Physician (Gastroenterology)  Extended Emergency Contact Information Primary Emergency Contact: Hazan,doug Mobile Phone: [phone] Relation: Son Secondary Emergency Contact: [facility] Phone: [phone] Relation: Daughter  Code Status: DNR Goals of care: Advanced Directive information    06/08/2024   10:51 AM  Advanced Directives  Does Patient Have a Medical Advance Directive? Yes  Type of Advance Directive Out of facility DNR (pink MOST or yellow form)  Does patient want to make changes to medical advance directive? No - Patient declined     Chief Complaint  Patient presents with   Acute Visit    HPI:  Pt is a 89 y.o. male seen today for an acute visit for Behaviors  Patient lives in [place] in [place] Patient has a history of progressive dementia most likely mixed Alzheimer's and vascular dementia. He also was having depression.  I had started him on Wellbutrin  as he had failed SSRIs in the past due to hyponatremia and weakness. The patient had couple of falls over the weekend.  I saw him yesterday and stopped his Wellbutrin .  All the labs are negative for any acute process.  His UA was negative Today patient has been very confused.  He is paranoid.  Not eating because he thinks something is mixed into his food. He recognized me but told me that however he thinks everything is going in the wrong direction. He would not comply with the nurses.  Trying to hit them   Past Medical History:  Diagnosis Date   Cancer  (HCC) 09/10/2002   prostate   Chronic kidney disease, stage 3b (HCC)    Coronary artery disease    Depression    Diabetes mellitus type 2 in obese    Dyslipidemia    Esophageal stricture    Monoclonal paraproteinemia 03/03/2013   Palpitations    RBBB (right bundle branch block)    Past Surgical History:  Procedure Laterality Date   ANKLE SURGERY Left    APPENDECTOMY     CARDIAC CATHETERIZATION     Dr. Swaziland   CATARACT EXTRACTION W/ INTRAOCULAR LENS  IMPLANT, BILATERAL     CORONARY ARTERY BYPASS GRAFT  Oct. 2008   x 5,Dr.Gerhardt   CORONARY/GRAFT ACUTE MI REVASCULARIZATION N/A 04/22/2022   Procedure: Coronary/Graft Acute MI Revascularization;  Surgeon: Swazil[place], Peter M, MD;  Location: [facility] INVASIVE CV LAB;  Service: Cardiovascular;  Laterality: N/A;   HERNIA REPAIR Bilateral    LEFT HEART CATH AND CORONARY ANGIOGRAPHY N/A 04/22/2022   Procedure: LEFT HEART CATH AND CORONARY ANGIOGRAPHY;  Surgeon: [place], Peter M, MD;  Location: [facility] INVASIVE CV LAB;  Service: Cardiovascular;  Laterality: N/A;   PROSTATECTOMY  2004   radical   right shoulder      replacement   vein strippping      Allergies  Allergen Reactions   Other Itching     Mepergan Fortis, (meprozine)  = itching (patient disputes this in 2023)    Outpatient Encounter Medications as of 06/09/2024  Medication Sig   acetaminophen  (TYLENOL ) 325 MG tablet Take 650 mg by mouth every 4 (four)  hours as needed for fever or headache.   amoxicillin  (AMOXIL ) 500 MG tablet Take 4 tablets (2,000 mg total) by mouth daily as needed. Give 1 hr prior to dental procedure   aspirin  EC 81 MG tablet Take 1 tablet (81 mg total) by mouth daily. Swallow whole.   bismuth subsalicylate (PEPTO BISMOL) 262 MG chewable tablet Chew 262 mg by mouth every 8 (eight) hours as needed.   Cyanocobalamin (VITAMIN B-12 PO) Take 5,000 mcg by mouth at bedtime.   fenofibrate  (TRICOR ) 48 MG tablet Take 1 tablet (48 mg total) by mouth daily.   fluticasone  (FLONASE )  50 MCG/ACT nasal spray Place 2 sprays into both nostrils daily. X 1 month the 1 spray each nare qd   hydrOXYzine (ATARAX) 25 MG tablet Take 25 mg by mouth as directed. Give 1 tablet by mouth as needed for Anxiety/Agitation for 14 Days BID PRN   loratadine (CLARITIN) 10 MG tablet Take 10 mg by mouth daily.   meclizine  (ANTIVERT ) 12.5 MG tablet Take 1 tablet (12.5 mg total) by mouth 3 (three) times daily as needed for dizziness.   memantine (NAMENDA) 5 MG tablet Take 1 tablet (5 mg total) by mouth 2 (two) times daily.   Misc Natural Products (TURMERIC, CURCUMIN,) CAPS Take 1 capsule by mouth 3 (three) times daily as needed. Give 1 capsule by mouth as needed for Supplement Take 1 capsule 3 times daily, preferably with meals. Capsules may be opened and prepared as a tea.   Multiple Vitamins-Minerals (PRESERVISION AREDS 2) CAPS Take 1 capsule by mouth in the morning and at bedtime.   nitroGLYCERIN  (NITROSTAT ) 0.4 MG SL tablet Place 1 tablet (0.4 mg total) under the tongue every 5 (five) minutes as needed for chest pain.   nystatin (MYCOSTATIN/NYSTOP) powder Apply 1 Application topically 2 (two) times daily.   omeprazole  (PRILOSEC) 20 MG capsule Take 1 capsule (20 mg total) by mouth daily.   ondansetron  (ZOFRAN ) 4 MG tablet Take 4 mg by mouth every 8 (eight) hours as needed for nausea or vomiting.   polyethylene glycol (MIRALAX  / GLYCOLAX ) 17 g packet Take 17 g by mouth as needed for mild constipation, moderate constipation or severe constipation.   sennosides-docusate sodium  (SENOKOT-S) 8.6-50 MG tablet Take 2 tablets by mouth at bedtime.   simvastatin  (ZOCOR ) 20 MG tablet Take 20 mg by mouth at bedtime.     TRADJENTA  5 MG TABS tablet Take 5 mg by mouth daily.   No facility-administered encounter medications on file as of 06/09/2024.    Review of Systems  Constitutional:  Positive for activity change. Negative for appetite change and unexpected weight change.  HENT: Negative.    Respiratory:   Negative for cough and shortness of breath.   Cardiovascular:  Negative for leg swelling.  Gastrointestinal:  Negative for constipation.  Genitourinary:  Negative for frequency.  Musculoskeletal:  Positive for gait problem. Negative for arthralgias and myalgias.  Skin: Negative.  Negative for rash.  Neurological:  Positive for dizziness and weakness.  Psychiatric/Behavioral:  Positive for confusion, dysphoric mood and hallucinations. Negative for sleep disturbance. The patient is nervous/anxious.   All other systems reviewed and are negative.   Immunization History  Administered Date(s) Administered   Influenza, Quadrivalent, Recombinant, Inj, Pf 05/06/2018, 06/05/2019, 06/21/2020, 06/05/2021, 05/30/2023   Influenza-Unspecified 06/05/2021, 07/02/2023   Moderna Sars-Covid-2 Vaccination 09/22/2019, 10/20/2019, 07/26/2020, 06/27/2021   PNEUMOCOCCAL CONJUGATE-20 06/29/2014   Pneumococcal Conjugate-13 06/21/2013   Pneumococcal-Unspecified 08/12/2013   Td 07/29/2009, 07/29/2013   Tetanus 11/06/2023  Zoster Recombinant(Shingrix) 01/20/2013   Pertinent  Health Maintenance Due  Topic Date Due   Influenza Vaccine  04/10/2024   HEMOGLOBIN A1C  08/03/2024   FOOT EXAM  11/03/2024   OPHTHALMOLOGY EXAM  11/25/2024      04/22/2022    7:45 AM 04/22/2022   10:00 PM 04/23/2022   10:00 AM 07/23/2023    2:39 PM 05/13/2024   10:00 AM  Fall Risk  Falls in the past year?    0 1  Was there an injury with Fall?    0 1  Fall Risk Category Calculator    0 3  (RETIRED) Patient Fall Risk Level High fall risk  High fall risk  High fall risk     Patient at Risk for Falls Due to    Impaired balance/gait;Impaired mobility History of fall(s);Impaired balance/gait;Impaired mobility  Fall risk Follow up    Falls evaluation completed Falls evaluation completed;Education provided     Data saved with a previous flowsheet row definition   Functional Status Survey:    Vitals:   06/09/24 1419  BP: 130/73   Pulse: 81  Resp: 16  Temp: 97.7 F (36.5 C)  SpO2: 96%  Weight: 161 lb 9.6 oz (73.3 kg)  Height: 5' 7 (1.702 m)   Body mass index is 25.31 kg/m. Physical Exam Vitals reviewed.  Constitutional:      Appearance: Normal appearance.  HENT:     Head: Normocephalic.     Nose: Nose normal.     Mouth/Throat:     Mouth: Mucous membranes are moist.     Pharynx: Oropharynx is clear.  Eyes:     Pupils: Pupils are equal, round, and reactive to light.  Cardiovascular:     Rate and Rhythm: Normal rate and regular rhythm.     Pulses: Normal pulses.     Heart sounds: No murmur heard. Pulmonary:     Effort: Pulmonary effort is normal. No respiratory distress.     Breath sounds: Normal breath sounds. No rales.  Abdominal:     General: Abdomen is flat. Bowel sounds are normal.     Palpations: Abdomen is soft.  Musculoskeletal:        General: No swelling.     Cervical back: Neck supple.  Skin:    General: Skin is warm.  Neurological:     Mental Status: He is alert.     Comments: Very Confused But alert and will follow commands Paranoid about the staff  Psychiatric:        Mood and Affect: Mood normal.        Thought Content: Thought content normal.     Labs reviewed: Recent Labs    02/01/24 0501 02/25/24 0000 02/29/24 1948 03/19/24 0935 04/02/24 0000 05/26/24 0000  NA 134*   < > 137 137 138 138  K 3.9   < > 4.0 4.1 4.5 4.1  CL 106   < > 103 103 102 105  CO2 21*   < > 24 24 23* 19  GLUCOSE 118*  --  127* 142*  --   --   BUN 21   < > 22 29* 20 26*  CREATININE 1.31*   < > 1.39* 1.26* 1.2 1.2  CALCIUM  7.4*   < > 8.5* 8.9 9.0 9.5  MG  --   --  2.2  --   --   --    < > = values in this interval not displayed.   Recent Labs  12/18/23 1532 01/30/24 1920 02/25/24 0000 02/29/24 1948 04/02/24 0000 05/26/24 0000  AST 11* 24   < > 19 20 19   ALT 7 13   < > 10 13 10   ALKPHOS 46 37*   < > 36* 164* 66  BILITOT 0.4 0.4  --  0.6  --   --   PROT 5.9* 5.7*  --  5.5*  --    --   ALBUMIN 3.6 2.9*   < > 2.9* 3.5 3.8   < > = values in this interval not displayed.   Recent Labs    01/30/24 1920 02/01/24 0501 02/06/24 0000 02/29/24 1948 03/19/24 0935 05/26/24 0000  WBC 8.6 7.9   < > 8.2 9.2 6.7  NEUTROABS 8.0* 6.5  --  5.5  --   --   HGB 12.1* 10.8*   < > 10.3* 11.8* 12.5*  HCT 37.3* 33.4*   < > 31.6* 37.0* 37*  MCV 94.0 93.0  --  93.8 95.1  --   PLT 230 199   < > 236 237 312   < > = values in this interval not displayed.   Lab Results  Component Value Date   TSH 3.233 04/21/2022   Lab Results  Component Value Date   HGBA1C 6.9 (H) 02/01/2024   Lab Results  Component Value Date   CHOL 96 11/04/2023   HDL 44 11/04/2023   LDLCALC 42 11/04/2023   TRIG 52 11/04/2023   CHOLHDL 2.8 04/21/2022    Significant Diagnostic Results in last 30 days:  No results found.  Assessment/Plan 1. Mixed Alzheimer's and vascular dementia (HCC) (Primary) Discussed with the patient's son.  Patient has had lessening of his cognition with behaviors.  He did have a fall on the weekend.  But we agreed that this point sending him to ED would not change his prognosis.  His goals of care are comfort Will start him on Seroquel 12.5 mg twice daily And Depakote 125 mg twice daily Have already started him on Namenda 5 mg twice daily for behaviors.   2. Recurrent falls   3. Acute depression Has not tolerated SSRI and Wellbutrin  Will Consider Remeron    Family/ staff Communication:   Labs/tests ordered:   Total time spent in this patient care encounter was  45_  minutes; greater than 50% of the visit spent counseling patient and staff, reviewing records , Labs and coordinating care for problems addressed at this encounter.

## 2024-06-10 DIAGNOSIS — R41841 Cognitive communication deficit: Secondary | ICD-10-CM | POA: Diagnosis not present

## 2024-06-10 DIAGNOSIS — R531 Weakness: Secondary | ICD-10-CM | POA: Diagnosis not present

## 2024-06-10 DIAGNOSIS — R42 Dizziness and giddiness: Secondary | ICD-10-CM | POA: Diagnosis not present

## 2024-06-10 DIAGNOSIS — Z9181 History of falling: Secondary | ICD-10-CM | POA: Diagnosis not present

## 2024-06-10 MED ORDER — QUETIAPINE FUMARATE 25 MG PO TABS: 12.5000 mg | ORAL_TABLET | Freq: Two times a day (BID) | ORAL | Status: DC

## 2024-06-10 MED ORDER — DIVALPROEX SODIUM 125 MG PO DR TAB: 125.0000 mg | DELAYED_RELEASE_TABLET | Freq: Two times a day (BID) | ORAL | Status: AC

## 2024-06-11 ENCOUNTER — Encounter: Payer: Self-pay | Admitting: Adult Health

## 2024-06-11 ENCOUNTER — Non-Acute Institutional Stay (SKILLED_NURSING_FACILITY): Payer: Self-pay | Admitting: Adult Health

## 2024-06-11 DIAGNOSIS — F0280 Dementia in other diseases classified elsewhere without behavioral disturbance: Secondary | ICD-10-CM | POA: Diagnosis not present

## 2024-06-11 DIAGNOSIS — F32A Depression, unspecified: Secondary | ICD-10-CM | POA: Diagnosis not present

## 2024-06-11 DIAGNOSIS — F0150 Vascular dementia without behavioral disturbance: Secondary | ICD-10-CM | POA: Diagnosis not present

## 2024-06-11 DIAGNOSIS — G309 Alzheimer's disease, unspecified: Secondary | ICD-10-CM

## 2024-06-11 MED ORDER — ALPRAZOLAM 0.5 MG PO TABS: 0.5000 mg | ORAL_TABLET | Freq: Two times a day (BID) | ORAL | 0 refills | Status: DC | PRN

## 2024-06-11 NOTE — Progress Notes (Signed)
 Location:  Medical illustrator of Service:  SNF (31) Provider:   Bari [place], ANP [facility] [phone]   Charlanne Fredia CROME, MD  Patient Care Team: Charlanne Fredia CROME, MD as PCP - General (Internal Medicine) [place], Peter M, MD as PCP - Cardiology (Cardiology) Tat, Asberry RAMAN, DO as Attending Physician (Neurology) [place], Peter M, MD as Attending Physician (Cardiology) Twana Jeneal DASEN, MD (Hematology and Oncology) Rosalie Kitchens, MD as Attending Physician (Gastroenterology)  Extended Emergency Contact Information Primary Emergency Contact: Sigman,doug Mobile Phone: [phone] Relation: Son Secondary Emergency Contact: [facility] Phone: [phone] Relation: Daughter  Code Status:  DNR Goals of care: Advanced Directive information    06/08/2024   10:51 AM  Advanced Directives  Does Patient Have a Medical Advance Directive? Yes  Type of Advance Directive Out of facility DNR (pink MOST or yellow form)  Does patient want to make changes to medical advance directive? No - Patient declined     Chief Complaint  Patient presents with   Acute Visit    agitation    HPI:    The patient is a 89 year old with progressive dementia, mixed Alzheimer's and vascular, who presents with severe agitation and confusion.  Cognitive impairment and neuropsychiatric symptoms - Progressive dementia, mixed Alzheimer's and vascular etiology - Severe agitation and confusion - Aggressive behavior, including attempts to hit nursing staff and verbal aggression - Non-compliance with medications - Occasional refusal to eat or take medications  Frequent falls due to attempts to get up without help, poor safety  Last fall 06/04/24 per notes  Evaluation for acute medical processes - Urinalysis and laboratory studies negative for acute process  Psychotropic medication management - Previously started on Wellbutrin  for depression but refused to take it - SSRIs  avoided due to history of hyponatremia and weakness - Currently on Seroquel and Depakote started 06/08/24 - started on Namenda 06/08/24  06/08/24 Negative UA  Past Medical History:  Diagnosis Date   Cancer (HCC) 09/10/2002   prostate   Chronic kidney disease, stage 3b (HCC)    Coronary artery disease    Depression    Diabetes mellitus type 2 in obese    Dyslipidemia    Esophageal stricture    Monoclonal paraproteinemia 03/03/2013   Palpitations    RBBB (right bundle branch block)    Past Surgical History:  Procedure Laterality Date   ANKLE SURGERY Left    APPENDECTOMY     CARDIAC CATHETERIZATION     Dr. [place]   CATARACT EXTRACTION W/ INTRAOCULAR LENS  IMPLANT, BILATERAL     CORONARY ARTERY BYPASS GRAFT  Oct. 2008   x 5,Dr.Gerhardt   CORONARY/GRAFT ACUTE MI REVASCULARIZATION N/A 04/22/2022   Procedure: Coronary/Graft Acute MI Revascularization;  Surgeon: [place], Peter M, MD;  Location: [facility] INVASIVE CV LAB;  Service: Cardiovascular;  Laterality: N/A;   HERNIA REPAIR Bilateral    LEFT HEART CATH AND CORONARY ANGIOGRAPHY N/A 04/22/2022   Procedure: LEFT HEART CATH AND CORONARY ANGIOGRAPHY;  Surgeon: [place], Peter M, MD;  Location: [facility] INVASIVE CV LAB;  Service: Cardiovascular;  Laterality: N/A;   PROSTATECTOMY  2004   radical   right shoulder      replacement   vein strippping      Allergies  Allergen Reactions   Other Itching     Mepergan Fortis, (meprozine)  = itching (patient disputes this in 2023)    Outpatient Encounter Medications as of 06/11/2024  Medication Sig   ALPRAZolam  (XANAX ) 0.5  MG tablet Take 1 tablet (0.5 mg total) by mouth 2 (two) times daily as needed for anxiety.   acetaminophen  (TYLENOL ) 325 MG tablet Take 650 mg by mouth every 4 (four) hours as needed for fever or headache.   amoxicillin  (AMOXIL ) 500 MG tablet Take 4 tablets (2,000 mg total) by mouth daily as needed. Give 1 hr prior to dental procedure   aspirin  EC 81 MG tablet Take 1 tablet (81 mg total)  by mouth daily. Swallow whole.   bismuth subsalicylate (PEPTO BISMOL) 262 MG chewable tablet Chew 262 mg by mouth every 8 (eight) hours as needed.   Cyanocobalamin (VITAMIN B-12 PO) Take 5,000 mcg by mouth at bedtime.   divalproex (DEPAKOTE) 125 MG DR tablet Take 1 tablet (125 mg total) by mouth 2 (two) times daily.   fenofibrate  (TRICOR ) 48 MG tablet Take 1 tablet (48 mg total) by mouth daily.   fluticasone  (FLONASE ) 50 MCG/ACT nasal spray Place 2 sprays into both nostrils daily. X 1 month the 1 spray each nare qd   hydrOXYzine (ATARAX) 25 MG tablet Take 25 mg by mouth as directed. Give 1 tablet by mouth as needed for Anxiety/Agitation for 14 Days BID PRN   loratadine (CLARITIN) 10 MG tablet Take 10 mg by mouth daily.   meclizine  (ANTIVERT ) 12.5 MG tablet Take 1 tablet (12.5 mg total) by mouth 3 (three) times daily as needed for dizziness.   memantine (NAMENDA) 5 MG tablet Take 1 tablet (5 mg total) by mouth 2 (two) times daily.   Misc Natural Products (TURMERIC, CURCUMIN,) CAPS Take 1 capsule by mouth 3 (three) times daily as needed. Give 1 capsule by mouth as needed for Supplement Take 1 capsule 3 times daily, preferably with meals. Capsules may be opened and prepared as a tea.   Multiple Vitamins-Minerals (PRESERVISION AREDS 2) CAPS Take 1 capsule by mouth in the morning and at bedtime.   nitroGLYCERIN  (NITROSTAT ) 0.4 MG SL tablet Place 1 tablet (0.4 mg total) under the tongue every 5 (five) minutes as needed for chest pain.   omeprazole  (PRILOSEC) 20 MG capsule Take 1 capsule (20 mg total) by mouth daily.   ondansetron  (ZOFRAN ) 4 MG tablet Take 4 mg by mouth every 8 (eight) hours as needed for nausea or vomiting.   polyethylene glycol (MIRALAX  / GLYCOLAX ) 17 g packet Take 17 g by mouth as needed for mild constipation, moderate constipation or severe constipation.   QUEtiapine (SEROQUEL) 25 MG tablet Take 0.5 tablets (12.5 mg total) by mouth 2 (two) times daily.   sennosides-docusate sodium   (SENOKOT-S) 8.6-50 MG tablet Take 2 tablets by mouth at bedtime.   simvastatin  (ZOCOR ) 20 MG tablet Take 20 mg by mouth at bedtime.     TRADJENTA  5 MG TABS tablet Take 5 mg by mouth daily.   No facility-administered encounter medications on file as of 06/11/2024.    Review of Systems  Unable to perform ROS: Dementia    Immunization History  Administered Date(s) Administered   Influenza, Quadrivalent, Recombinant, Inj, Pf 05/06/2018, 06/05/2019, 06/21/2020, 06/05/2021, 05/30/2023   Influenza-Unspecified 06/05/2021, 07/02/2023   Moderna Sars-Covid-2 Vaccination 09/22/2019, 10/20/2019, 07/26/2020, 06/27/2021   PNEUMOCOCCAL CONJUGATE-20 06/29/2014   Pneumococcal Conjugate-13 06/21/2013   Pneumococcal-Unspecified 08/12/2013   Td 07/29/2009, 07/29/2013   Tetanus 11/06/2023   Zoster Recombinant(Shingrix) 01/20/2013   Pertinent  Health Maintenance Due  Topic Date Due   Influenza Vaccine  04/10/2024   HEMOGLOBIN A1C  08/03/2024   FOOT EXAM  11/03/2024   OPHTHALMOLOGY EXAM  11/25/2024  04/22/2022    7:45 AM 04/22/2022   10:00 PM 04/23/2022   10:00 AM 07/23/2023    2:39 PM 05/13/2024   10:00 AM  Fall Risk  Falls in the past year?    0 1  Was there an injury with Fall?    0 1  Fall Risk Category Calculator    0 3  (RETIRED) Patient Fall Risk Level High fall risk  High fall risk  High fall risk     Patient at Risk for Falls Due to    Impaired balance/gait;Impaired mobility History of fall(s);Impaired balance/gait;Impaired mobility  Fall risk Follow up    Falls evaluation completed Falls evaluation completed;Education provided     Data saved with a previous flowsheet row definition   Functional Status Survey:    Vitals:   06/11/24 1624  BP: 106/62  Pulse: 82  Resp: 20  Temp: 98.7 F (37.1 C)  SpO2: 96%   There is no height or weight on file to calculate BMI. Physical Exam Vitals and nursing note reviewed.  Constitutional:      General: He is not in acute distress.     Appearance: Normal appearance. He is not ill-appearing.     Comments: Asleep but arouses  Musculoskeletal:     Right lower leg: No edema.     Left lower leg: No edema.  Skin:    Comments: Skin tears to both hands and right leg covered with polymem  Neurological:     Comments: Confused, no focal deficit     Labs reviewed: Recent Labs    02/01/24 0501 02/25/24 0000 02/29/24 1948 03/19/24 0935 04/02/24 0000 05/26/24 0000  NA 134*   < > 137 137 138 138  K 3.9   < > 4.0 4.1 4.5 4.1  CL 106   < > 103 103 102 105  CO2 21*   < > 24 24 23* 19  GLUCOSE 118*  --  127* 142*  --   --   BUN 21   < > 22 29* 20 26*  CREATININE 1.31*   < > 1.39* 1.26* 1.2 1.2  CALCIUM  7.4*   < > 8.5* 8.9 9.0 9.5  MG  --   --  2.2  --   --   --    < > = values in this interval not displayed.   Recent Labs    12/18/23 1532 01/30/24 1920 02/25/24 0000 02/29/24 1948 04/02/24 0000 05/26/24 0000  AST 11* 24   < > 19 20 19   ALT 7 13   < > 10 13 10   ALKPHOS 46 37*   < > 36* 164* 66  BILITOT 0.4 0.4  --  0.6  --   --   PROT 5.9* 5.7*  --  5.5*  --   --   ALBUMIN 3.6 2.9*   < > 2.9* 3.5 3.8   < > = values in this interval not displayed.   Recent Labs    01/30/24 1920 02/01/24 0501 02/06/24 0000 02/29/24 1948 03/19/24 0935 05/26/24 0000  WBC 8.6 7.9   < > 8.2 9.2 6.7  NEUTROABS 8.0* 6.5  --  5.5  --   --   HGB 12.1* 10.8*   < > 10.3* 11.8* 12.5*  HCT 37.3* 33.4*   < > 31.6* 37.0* 37*  MCV 94.0 93.0  --  93.8 95.1  --   PLT 230 199   < > 236 237 312   < > =  values in this interval not displayed.   Lab Results  Component Value Date   TSH 3.233 04/21/2022   Lab Results  Component Value Date   HGBA1C 6.9 (H) 02/01/2024   Lab Results  Component Value Date   CHOL 96 11/04/2023   HDL 44 11/04/2023   LDLCALC 42 11/04/2023   TRIG 52 11/04/2023   CHOLHDL 2.8 04/21/2022    Significant Diagnostic Results in last 30 days:  No results found.  Assessment/Plan Dementia with behavioral  disturbance Progressive dementia with mixed Alzheimer's and vascular components. Severe paranoia, agitation, confusion, and aggression.  - Continue Seroquel and Depakote. - Administer Xanax  0.5 mg BID PRN for severe agitation. -current meds needs more time to work.  -give xanax  short term until depakote and seroquel take full effect.  -DNR, avoid hospitalizations.   History of falls Recent falls with head impact. Increased fall risk due to agitation and medication side effects. Home care to prevent falls at night currently in place.  Monitor closely  Depression Depression with previous refusal of Wellbutrin . SSRIs avoided due to low sodium and weakness. Consider Remeron at a future date

## 2024-06-15 ENCOUNTER — Non-Acute Institutional Stay (SKILLED_NURSING_FACILITY): Payer: Self-pay | Admitting: Internal Medicine

## 2024-06-15 ENCOUNTER — Encounter: Payer: Self-pay | Admitting: Internal Medicine

## 2024-06-15 DIAGNOSIS — G309 Alzheimer's disease, unspecified: Secondary | ICD-10-CM | POA: Diagnosis not present

## 2024-06-15 DIAGNOSIS — F0280 Dementia in other diseases classified elsewhere without behavioral disturbance: Secondary | ICD-10-CM

## 2024-06-15 DIAGNOSIS — F32A Depression, unspecified: Secondary | ICD-10-CM

## 2024-06-15 DIAGNOSIS — F0150 Vascular dementia without behavioral disturbance: Secondary | ICD-10-CM

## 2024-06-15 NOTE — Progress Notes (Signed)
 Location:  [facility] Room Number: 140-P Place of Service:  SNF [id number]) Provider:  Charlanne Fredia CROME, MD  Tony Fredia CROME, MD  Patient Care Team: Tony Fredia CROME, MD as PCP - General (Internal Medicine) Lee, Tony M, MD as PCP - Cardiology (Cardiology) Lee, Tony RAMAN, DO as Attending Physician (Neurology) Lee, Tony M, MD as Attending Physician (Cardiology) Tony Jeneal DASEN, MD (Hematology and Oncology) Tony Kitchens, MD as Attending Physician (Gastroenterology)  Extended Emergency Contact Information Primary Emergency Contact: Lee,Tony Mobile Phone: [phone] Relation: Son Secondary Emergency Contact: [facility] Phone: [phone] Relation: Daughter  Code Status:  DNR Goals of care: Advanced Directive information    06/15/2024   12:35 PM  Advanced Directives  Does Patient Have a Medical Advance Directive? Yes  Type of Advance Directive Out of facility DNR (pink MOST or yellow form);Living will  Does patient want to make changes to medical advance directive? No - Patient declined  Pre-existing out of facility DNR order (yellow form or pink MOST form) Pink MOST form placed in chart (order not valid for inpatient use);Yellow form placed in chart (order not valid for inpatient use)     Chief Complaint  Patient presents with   Acute Visit    HPI:  Pt is a 89 y.o. male seen today for an acute visit for behaviors  Patient lives in [facility] in [place] Patient has a history of progressive dementia most likely mixed Alzheimer's and vascular dementia.  He also was having depression. Failed  Wellbutrin  as he got confused had failed SSRIs in the past due to hyponatremia and weakness.  Continues to have behaviors Issues Discussed with the son last visit Does not want him to go to ED or have further work up  All the labs are negative for any acute process.  His UA was negative  Over the weekend started on Haldol due to confusion and  agitation and hitting the staff He has responded well to that  Got Haldol this morning and was calmer for my visit But still was talking how staff is trying to get him and he scared them away He also slipped from his chair He seems to also have responded to low dose of Xanax  I started him also on Namenda and Depakote  Past Medical History:  Diagnosis Date   Cancer (HCC) 09/10/2002   prostate   Chronic kidney disease, stage 3b (HCC)    Coronary artery disease    Depression    Diabetes mellitus type 2 in obese    Dyslipidemia    Esophageal stricture    Monoclonal paraproteinemia 03/03/2013   Palpitations    RBBB (right bundle branch block)    Past Surgical History:  Procedure Laterality Date   ANKLE SURGERY Left    APPENDECTOMY     CARDIAC CATHETERIZATION     Dr. Swaziland   CATARACT EXTRACTION W/ INTRAOCULAR LENS  IMPLANT, BILATERAL     CORONARY ARTERY BYPASS GRAFT  Oct. 2008   x 5,Dr.Gerhardt   CORONARY/GRAFT ACUTE MI REVASCULARIZATION N/A 04/22/2022   Procedure: Coronary/Graft Acute MI Revascularization;  Surgeon: Lee, Tony M, MD;  Location: [facility] INVASIVE CV LAB;  Service: Cardiovascular;  Laterality: N/A;   HERNIA REPAIR Bilateral    LEFT HEART CATH AND CORONARY ANGIOGRAPHY N/A 04/22/2022   Procedure: LEFT HEART CATH AND CORONARY ANGIOGRAPHY;  Surgeon: Lee, Tony M, MD;  Location: [facility] INVASIVE CV LAB;  Service: Cardiovascular;  Laterality: N/A;   PROSTATECTOMY  2004   radical  right shoulder      replacement   vein strippping      Allergies  Allergen Reactions   Other Itching     Mepergan Fortis, (meprozine)  = itching (patient disputes this in 2023)    Allergies as of 06/15/2024       Reactions   Other Itching    Mepergan Fortis, (meprozine)  = itching (patient disputes this in 2023)        Medication List        Accurate as of June 15, 2024  7:40 PM. If you have any questions, ask your nurse or doctor.          acetaminophen  325 MG  tablet Commonly known as: TYLENOL  Take 650 mg by mouth every 4 (four) hours as needed for fever or headache.   ALPRAZolam  0.5 MG tablet Commonly known as: XANAX  Take 1 tablet (0.5 mg total) by mouth 2 (two) times daily as needed for anxiety.   amoxicillin  500 MG tablet Commonly known as: AMOXIL  Take 4 tablets (2,000 mg total) by mouth daily as needed. Give 1 hr prior to dental procedure   aspirin  EC 81 MG tablet Take 1 tablet (81 mg total) by mouth daily. Swallow whole.   bismuth subsalicylate 262 MG chewable tablet Commonly known as: PEPTO BISMOL Chew 262 mg by mouth every 8 (eight) hours as needed.   divalproex 125 MG DR tablet Commonly known as: Depakote Take 1 tablet (125 mg total) by mouth 2 (two) times daily.   fenofibrate  48 MG tablet Commonly known as: Tricor  Take 1 tablet (48 mg total) by mouth daily.   fluticasone  50 MCG/ACT nasal spray Commonly known as: FLONASE  Place 2 sprays into both nostrils daily. X 1 month the 1 spray each nare qd   hydrOXYzine 25 MG tablet Commonly known as: ATARAX Take 25 mg by mouth as directed. Give 1 tablet by mouth as needed for Anxiety/Agitation for 14 Days BID PRN   loratadine 10 MG tablet Commonly known as: CLARITIN Take 10 mg by mouth daily.   meclizine  12.5 MG tablet Commonly known as: ANTIVERT  Take 1 tablet (12.5 mg total) by mouth 3 (three) times daily as needed for dizziness.   memantine 5 MG tablet Commonly known as: NAMENDA Take 1 tablet (5 mg total) by mouth 2 (two) times daily.   nitroGLYCERIN  0.4 MG SL tablet Commonly known as: NITROSTAT  Place 1 tablet (0.4 mg total) under the tongue every 5 (five) minutes as needed for chest pain.   omeprazole  20 MG capsule Commonly known as: PRILOSEC Take 1 capsule (20 mg total) by mouth daily.   ondansetron  4 MG tablet Commonly known as: ZOFRAN  Take 4 mg by mouth every 8 (eight) hours as needed for nausea or vomiting.   polyethylene glycol 17 g packet Commonly known  as: MIRALAX  / GLYCOLAX  Take 17 g by mouth as needed for mild constipation, moderate constipation or severe constipation.   PreserVision AREDS 2 Caps Take 1 capsule by mouth in the morning and at bedtime.   QUEtiapine 25 MG tablet Commonly known as: SEROquel Take 0.5 tablets (12.5 mg total) by mouth 2 (two) times daily.   sennosides-docusate sodium  8.6-50 MG tablet Commonly known as: SENOKOT-S Take 2 tablets by mouth at bedtime.   simvastatin  20 MG tablet Commonly known as: ZOCOR  Take 20 mg by mouth at bedtime.   Tradjenta  5 MG Tabs tablet Generic drug: linagliptin  Take 5 mg by mouth daily.   Turmeric (Curcumin) Caps Take 1 capsule  by mouth 3 (three) times daily as needed. Give 1 capsule by mouth as needed for Supplement Take 1 capsule 3 times daily, preferably with meals. Capsules may be opened and prepared as a tea.   VITAMIN B-12 PO Take 5,000 mcg by mouth at bedtime.        Review of Systems  Unable to perform ROS: Dementia    Immunization History  Administered Date(s) Administered   Influenza, Quadrivalent, Recombinant, Inj, Pf 05/06/2018, 06/05/2019, 06/21/2020, 06/05/2021, 05/30/2023   Influenza-Unspecified 06/05/2021, 07/02/2023   Moderna Sars-Covid-2 Vaccination 09/22/2019, 10/20/2019, 07/26/2020, 06/27/2021   PNEUMOCOCCAL CONJUGATE-20 06/29/2014   Pneumococcal Conjugate-13 06/21/2013   Pneumococcal-Unspecified 08/12/2013   Td 07/29/2009, 07/29/2013   Tetanus 11/06/2023   Zoster Recombinant(Shingrix) 01/20/2013   Pertinent  Health Maintenance Due  Topic Date Due   Mammogram  Never done   Influenza Vaccine  04/10/2024   HEMOGLOBIN A1C  08/03/2024   FOOT EXAM  11/03/2024   OPHTHALMOLOGY EXAM  11/25/2024      04/22/2022    7:45 AM 04/22/2022   10:00 PM 04/23/2022   10:00 AM 07/23/2023    2:39 PM 05/13/2024   10:00 AM  Fall Risk  Falls in the past year?    0 1  Was there an injury with Fall?    0 1  Fall Risk Category Calculator    0 3  (RETIRED)  Patient Fall Risk Level High fall risk  High fall risk  High fall risk     Patient at Risk for Falls Due to    Impaired balance/gait;Impaired mobility History of fall(s);Impaired balance/gait;Impaired mobility  Fall risk Follow up    Falls evaluation completed Falls evaluation completed;Education provided     Data saved with a previous flowsheet row definition   Functional Status Survey:    Vitals:   06/15/24 1230  BP: (!) 137/97  Pulse: (!) 103  Temp: (!) 97.4 F (36.3 C)  SpO2: 95%  Weight: 161 lb 9.6 oz (73.3 kg)   Body mass index is 25.31 kg/m. Physical Exam Vitals reviewed.  Constitutional:      Appearance: Normal appearance.  HENT:     Head: Normocephalic.     Nose: Nose normal.     Mouth/Throat:     Mouth: Mucous membranes are moist.     Pharynx: Oropharynx is clear.  Eyes:     Pupils: Pupils are equal, round, and reactive to light.  Cardiovascular:     Rate and Rhythm: Normal rate and regular rhythm.     Pulses: Normal pulses.     Heart sounds: No murmur heard. Pulmonary:     Effort: Pulmonary effort is normal. No respiratory distress.     Breath sounds: Normal breath sounds. No rales.  Abdominal:     General: Abdomen is flat. Bowel sounds are normal.     Palpations: Abdomen is soft.  Musculoskeletal:        General: No swelling.     Cervical back: Neck supple.  Skin:    General: Skin is warm.  Neurological:     Mental Status: He is alert.     Comments: No Focal deficit Very Calm with me but paranoid about the Nurses  Psychiatric:     Comments: Agitation with Anxiety and Paranoia     Labs reviewed: Recent Labs    02/01/24 0501 02/25/24 0000 02/29/24 1948 03/19/24 0935 04/02/24 0000 05/26/24 0000  NA 134*   < > 137 137 138 138  K 3.9   < >  4.0 4.1 4.5 4.1  CL 106   < > 103 103 102 105  CO2 21*   < > 24 24 23* 19  GLUCOSE 118*  --  127* 142*  --   --   BUN 21   < > 22 29* 20 26*  CREATININE 1.31*   < > 1.39* 1.26* 1.2 1.2  CALCIUM  7.4*   <  > 8.5* 8.9 9.0 9.5  MG  --   --  2.2  --   --   --    < > = values in this interval not displayed.   Recent Labs    12/18/23 1532 01/30/24 1920 02/25/24 0000 02/29/24 1948 04/02/24 0000 05/26/24 0000  AST 11* 24   < > 19 20 19   ALT 7 13   < > 10 13 10   ALKPHOS 46 37*   < > 36* 164* 66  BILITOT 0.4 0.4  --  0.6  --   --   PROT 5.9* 5.7*  --  5.5*  --   --   ALBUMIN 3.6 2.9*   < > 2.9* 3.5 3.8   < > = values in this interval not displayed.   Recent Labs    01/30/24 1920 02/01/24 0501 02/06/24 0000 02/29/24 1948 03/19/24 0935 05/26/24 0000  WBC 8.6 7.9   < > 8.2 9.2 6.7  NEUTROABS 8.0* 6.5  --  5.5  --   --   HGB 12.1* 10.8*   < > 10.3* 11.8* 12.5*  HCT 37.3* 33.4*   < > 31.6* 37.0* 37*  MCV 94.0 93.0  --  93.8 95.1  --   PLT 230 199   < > 236 237 312   < > = values in this interval not displayed.   Lab Results  Component Value Date   TSH 3.233 04/21/2022   Lab Results  Component Value Date   HGBA1C 6.9 (H) 02/01/2024   Lab Results  Component Value Date   CHOL 96 11/04/2023   HDL 44 11/04/2023   LDLCALC 42 11/04/2023   TRIG 52 11/04/2023   CHOLHDL 2.8 04/21/2022    Significant Diagnostic Results in last 30 days:  No results found.  Assessment/Plan 1. Mixed Alzheimer's and vascular dementia (HCC) (Primary) Discontinue seroquel Start on Xanax  0.5 mg BID Also Continue Prn dose Haldol I/m For extreme agitation Already on Depakote and Namenda   2. Depression, unspecified depression type Will Consider Remeron when he is more calmer Failed Wellbutrin  and SSRI    Family/ staff Communication:   Labs/tests ordered:

## 2024-06-16 DIAGNOSIS — B351 Tinea unguium: Secondary | ICD-10-CM | POA: Diagnosis not present

## 2024-06-16 DIAGNOSIS — M2042 Other hammer toe(s) (acquired), left foot: Secondary | ICD-10-CM | POA: Diagnosis not present

## 2024-06-16 DIAGNOSIS — M79672 Pain in left foot: Secondary | ICD-10-CM | POA: Diagnosis not present

## 2024-06-16 DIAGNOSIS — E119 Type 2 diabetes mellitus without complications: Secondary | ICD-10-CM | POA: Diagnosis not present

## 2024-06-16 DIAGNOSIS — M79671 Pain in right foot: Secondary | ICD-10-CM | POA: Diagnosis not present

## 2024-06-16 DIAGNOSIS — M2041 Other hammer toe(s) (acquired), right foot: Secondary | ICD-10-CM | POA: Diagnosis not present

## 2024-06-26 ENCOUNTER — Encounter: Payer: Self-pay | Admitting: Adult Health

## 2024-06-26 ENCOUNTER — Non-Acute Institutional Stay (SKILLED_NURSING_FACILITY): Payer: Self-pay | Admitting: Adult Health

## 2024-06-26 DIAGNOSIS — E538 Deficiency of other specified B group vitamins: Secondary | ICD-10-CM

## 2024-06-26 DIAGNOSIS — R5383 Other fatigue: Secondary | ICD-10-CM

## 2024-06-26 DIAGNOSIS — M7989 Other specified soft tissue disorders: Secondary | ICD-10-CM

## 2024-06-26 MED ORDER — CEPHALEXIN 500 MG PO CAPS: 500.0000 mg | ORAL_CAPSULE | Freq: Three times a day (TID) | ORAL | Status: AC

## 2024-06-26 MED ORDER — ALPRAZOLAM 0.5 MG PO TABS: 0.2500 mg | ORAL_TABLET | Freq: Two times a day (BID) | ORAL | 2 refills | Status: DC

## 2024-06-26 NOTE — Progress Notes (Addendum)
 Location:  Medical illustrator of Service:  SNF (31) Provider:   Bari America, ANP [facility] [phone]   Charlanne Fredia CROME, MD  Patient Care Team: Charlanne Fredia CROME, MD as PCP - General (Internal Medicine) [place]az[place], Peter M, MD as PCP - Cardiology (Cardiology) Tat, Asberry RAMAN, DO as Attending Physician (Neurology) [place], Peter M, MD as Attending Physician (Cardiology) Twana Jeneal DASEN, MD (Hematology and Oncology) Rosalie Kitchens, MD as Attending Physician (Gastroenterology)  Extended Emergency Contact Information Primary Emergency Contact: Castillo,doug Mobile Phone: [phone] Relation: Son Secondary Emergency Contact: [facility] Phone: [phone] Relation: Daughter  Code Status:  DNR Goals of care: Advanced Directive information    06/15/2024   12:35 PM  Advanced Directives  Does Patient Have a Medical Advance Directive? Yes  Type of Advance Directive Out of facility DNR (pink MOST or yellow form);Living will  Does patient want to make changes to medical advance directive? No - Patient declined  Pre-existing out of facility DNR order (yellow form or pink MOST form) Pink MOST form placed in chart (order not valid for inpatient use);Yellow form placed in chart (order not valid for inpatient use)     Chief Complaint  Patient presents with   Acute Visit    Right hand swelling, lethargy    HPI:  Pt is a 89 y.o. male seen today for an acute visit for right hand swelling  The nurse noticed right hand redness and swelling, also the area is tender. He has had several falls but not recently and staff was not aware of a fall that injured his hand. He is a poor historian due to underlying dementia.  Also he was treated for cellulitis with keflex to the left hand due to an infected skin tear which is healed.  An SBAR was written with a request to reduce meds from the family due to lethargy. For my visit he is alert but confused. He was dealing  with issues of agitation and aggression. Currently on depakote, xanax , namenda, and prn haldol and hydroxyzine. The prn meds are being used less. He failed wellbutrin  and had issuew with low sodium with SSRI.  Also there was an SBAR to start IM B12 per his family He is currently on oral supplementation.  He has a hx of B12 deficiency. NO recent level. Hgb 12 MCV 91.6 06/07/24  Past Medical History:  Diagnosis Date   Cancer (HCC) 09/10/2002   prostate   Chronic kidney disease, stage 3b (HCC)    Coronary artery disease    Depression    Diabetes mellitus type 2 in obese    Dyslipidemia    Esophageal stricture    Monoclonal paraproteinemia 03/03/2013   Palpitations    RBBB (right bundle branch block)    Past Surgical History:  Procedure Laterality Date   ANKLE SURGERY Left    APPENDECTOMY     CARDIAC CATHETERIZATION     Dr. [place]   CATARACT EXTRACTION W/ INTRAOCULAR LENS  IMPLANT, BILATERAL     CORONARY ARTERY BYPASS GRAFT  Oct. 2008   x 5,Dr.Gerhardt   CORONARY/GRAFT ACUTE MI REVASCULARIZATION N/A 04/22/2022   Procedure: Coronary/Graft Acute MI Revascularization;  Surgeon: Swaziland, Peter M, MD;  Location: [facility] INVASIVE CV LAB;  Service: Cardiovascular;  Laterality: N/A;   HERNIA REPAIR Bilateral    LEFT HEART CATH AND CORONARY ANGIOGRAPHY N/A 04/22/2022   Procedure: LEFT HEART CATH AND CORONARY ANGIOGRAPHY;  Surgeon: Swaziland, Peter M, MD;  Location: [facility] INVASIVE CV  LAB;  Service: Cardiovascular;  Laterality: N/A;   PROSTATECTOMY  2004   radical   right shoulder      replacement   vein strippping      Allergies  Allergen Reactions   Other Itching     Mepergan Fortis, (meprozine)  = itching (patient disputes this in 2023)    Outpatient Encounter Medications as of 06/26/2024  Medication Sig   haloperidol lactate (HALDOL) 5 MG/ML injection Inject 3 mg into the muscle every 8 (eight) hours as needed.   sennosides-docusate sodium  (SENOKOT-S) 8.6-50 MG tablet Take 2 tablets by mouth  at bedtime.   simvastatin  (ZOCOR ) 20 MG tablet Take 20 mg by mouth at bedtime.     TRADJENTA  5 MG TABS tablet Take 5 mg by mouth daily.   acetaminophen  (TYLENOL ) 325 MG tablet Take 650 mg by mouth every 4 (four) hours as needed for fever or headache.   ALPRAZolam  (XANAX ) 0.5 MG tablet Take 1 tablet (0.5 mg total) by mouth 2 (two) times daily as needed for anxiety.   ALPRAZolam  (XANAX ) 0.5 MG tablet Take 0.5 tablets (0.25 mg total) by mouth 2 (two) times daily.   amoxicillin  (AMOXIL ) 500 MG tablet Take 4 tablets (2,000 mg total) by mouth daily as needed. Give 1 hr prior to dental procedure   aspirin  EC 81 MG tablet Take 1 tablet (81 mg total) by mouth daily. Swallow whole.   bismuth subsalicylate (PEPTO BISMOL) 262 MG chewable tablet Chew 262 mg by mouth every 8 (eight) hours as needed.   Cyanocobalamin (VITAMIN B-12 PO) Take 5,000 mcg by mouth at bedtime.   divalproex (DEPAKOTE) 125 MG DR tablet Take 1 tablet (125 mg total) by mouth 2 (two) times daily.   fenofibrate  (TRICOR ) 48 MG tablet Take 1 tablet (48 mg total) by mouth daily.   fluticasone  (FLONASE ) 50 MCG/ACT nasal spray Place 2 sprays into both nostrils daily. X 1 month the 1 spray each nare qd   hydrOXYzine (ATARAX) 25 MG tablet Take 25 mg by mouth as directed. Give 1 tablet by mouth as needed for Anxiety/Agitation for 14 Days BID PRN   loratadine (CLARITIN) 10 MG tablet Take 10 mg by mouth daily.   meclizine  (ANTIVERT ) 12.5 MG tablet Take 1 tablet (12.5 mg total) by mouth 3 (three) times daily as needed for dizziness.   memantine (NAMENDA) 5 MG tablet Take 1 tablet (5 mg total) by mouth 2 (two) times daily.   Misc Natural Products (TURMERIC, CURCUMIN,) CAPS Take 1 capsule by mouth 3 (three) times daily as needed. Give 1 capsule by mouth as needed for Supplement Take 1 capsule 3 times daily, preferably with meals. Capsules may be opened and prepared as a tea.   Multiple Vitamins-Minerals (PRESERVISION AREDS 2) CAPS Take 1 capsule by  mouth in the morning and at bedtime.   nitroGLYCERIN  (NITROSTAT ) 0.4 MG SL tablet Place 1 tablet (0.4 mg total) under the tongue every 5 (five) minutes as needed for chest pain.   omeprazole  (PRILOSEC) 20 MG capsule Take 1 capsule (20 mg total) by mouth daily.   ondansetron  (ZOFRAN ) 4 MG tablet Take 4 mg by mouth every 8 (eight) hours as needed for nausea or vomiting.   polyethylene glycol (MIRALAX  / GLYCOLAX ) 17 g packet Take 17 g by mouth as needed for mild constipation, moderate constipation or severe constipation.   [DISCONTINUED] ALPRAZolam  (XANAX ) 0.5 MG tablet Take 0.25 mg by mouth 2 (two) times daily.   [DISCONTINUED] QUEtiapine (SEROQUEL) 25 MG tablet Take 0.5  tablets (12.5 mg total) by mouth 2 (two) times daily.   No facility-administered encounter medications on file as of 06/26/2024.    Review of Systems  Constitutional:  Positive for activity change (less ambulatory). Negative for appetite change, chills, diaphoresis, fatigue and fever.  HENT:  Negative for congestion.   Respiratory:  Negative for cough, shortness of breath and wheezing.   Cardiovascular:  Negative for chest pain and leg swelling.  Gastrointestinal:  Negative for abdominal distention, abdominal pain, constipation, diarrhea, nausea and vomiting.  Genitourinary:  Negative for difficulty urinating, dysuria and urgency.  Musculoskeletal:  Positive for arthralgias, gait problem and joint swelling. Negative for back pain, myalgias and neck pain.  Skin:  Positive for color change. Negative for rash.  Neurological:  Negative for dizziness and weakness.  Psychiatric/Behavioral:  Positive for agitation, behavioral problems and confusion.     Immunization History  Administered Date(s) Administered   Influenza, Quadrivalent, Recombinant, Inj, Pf 05/06/2018, 06/05/2019, 06/21/2020, 06/05/2021, 05/30/2023   Influenza-Unspecified 06/05/2021, 07/02/2023   Moderna Sars-Covid-2 Vaccination 09/22/2019, 10/20/2019, 07/26/2020,  06/27/2021   PNEUMOCOCCAL CONJUGATE-20 06/29/2014   Pneumococcal Conjugate-13 06/21/2013   Pneumococcal-Unspecified 08/12/2013   Td 07/29/2009, 07/29/2013   Tetanus 11/06/2023   Zoster Recombinant(Shingrix) 01/20/2013   Pertinent  Health Maintenance Due  Topic Date Due   Influenza Vaccine  04/10/2024   HEMOGLOBIN A1C  08/03/2024   FOOT EXAM  11/03/2024   OPHTHALMOLOGY EXAM  11/25/2024      04/22/2022    7:45 AM 04/22/2022   10:00 PM 04/23/2022   10:00 AM 07/23/2023    2:39 PM 05/13/2024   10:00 AM  Fall Risk  Falls in the past year?    0 1  Was there an injury with Fall?    0 1  Fall Risk Category Calculator    0 3  (RETIRED) Patient Fall Risk Level High fall risk  High fall risk  High fall risk     Patient at Risk for Falls Due to    Impaired balance/gait;Impaired mobility History of fall(s);Impaired balance/gait;Impaired mobility  Fall risk Follow up    Falls evaluation completed Falls evaluation completed;Education provided     Data saved with a previous flowsheet row definition   Functional Status Survey:    Vitals:   06/26/24 1324  BP: 100/62  Pulse: 71  Resp: 16  Temp: 97.7 F (36.5 C)  SpO2: 99%   There is no height or weight on file to calculate BMI. Physical Exam Vitals and nursing note reviewed.  Constitutional:      Appearance: Normal appearance.  HENT:     Head:     Comments: Bruising to left scalp and temple area Eyes:     Conjunctiva/sclera: Conjunctivae normal.     Pupils: Pupils are equal, round, and reactive to light.  Cardiovascular:     Rate and Rhythm: Normal rate and regular rhythm.     Heart sounds: No murmur heard. Pulmonary:     Effort: Pulmonary effort is normal.     Breath sounds: Normal breath sounds.  Abdominal:     General: Abdomen is flat. Bowel sounds are normal.     Palpations: Abdomen is soft.  Musculoskeletal:        General: Swelling (right wrist with erythema and warmth, no bruising or defomrity normal ROM pos radial  pulse) present.     Right lower leg: No edema.     Left lower leg: No edema.  Skin:    General: Skin is warm and  dry.  Neurological:     General: No focal deficit present.     Mental Status: He is alert. Mental status is at baseline.  Psychiatric:        Mood and Affect: Mood normal.     Labs reviewed: Recent Labs    02/01/24 0501 02/25/24 0000 02/29/24 1948 03/19/24 0935 04/02/24 0000 05/26/24 0000  NA 134*   < > 137 137 138 138  K 3.9   < > 4.0 4.1 4.5 4.1  CL 106   < > 103 103 102 105  CO2 21*   < > 24 24 23* 19  GLUCOSE 118*  --  127* 142*  --   --   BUN 21   < > 22 29* 20 26*  CREATININE 1.31*   < > 1.39* 1.26* 1.2 1.2  CALCIUM  7.4*   < > 8.5* 8.9 9.0 9.5  MG  --   --  2.2  --   --   --    < > = values in this interval not displayed.   Recent Labs    12/18/23 1532 01/30/24 1920 02/25/24 0000 02/29/24 1948 04/02/24 0000 05/26/24 0000  AST 11* 24   < > 19 20 19   ALT 7 13   < > 10 13 10   ALKPHOS 46 37*   < > 36* 164* 66  BILITOT 0.4 0.4  --  0.6  --   --   PROT 5.9* 5.7*  --  5.5*  --   --   ALBUMIN 3.6 2.9*   < > 2.9* 3.5 3.8   < > = values in this interval not displayed.   Recent Labs    01/30/24 1920 02/01/24 0501 02/06/24 0000 02/29/24 1948 03/19/24 0935 05/26/24 0000  WBC 8.6 7.9   < > 8.2 9.2 6.7  NEUTROABS 8.0* 6.5  --  5.5  --   --   HGB 12.1* 10.8*   < > 10.3* 11.8* 12.5*  HCT 37.3* 33.4*   < > 31.6* 37.0* 37*  MCV 94.0 93.0  --  93.8 95.1  --   PLT 230 199   < > 236 237 312   < > = values in this interval not displayed.   Lab Results  Component Value Date   TSH 3.233 04/21/2022   Lab Results  Component Value Date   HGBA1C 6.9 (H) 02/01/2024   Lab Results  Component Value Date   CHOL 96 11/04/2023   HDL 44 11/04/2023   LDLCALC 42 11/04/2023   TRIG 52 11/04/2023   CHOLHDL 2.8 04/21/2022    Significant Diagnostic Results in last 30 days:  No results found.  Assessment/Plan 1. Swelling of right hand (Primary) With redness  and warmth Recently treated for cellulitis on the other hand,will extend keflex Due to his recent falls will obtain xray of right hand and wrist.   2. Lethargy Reduce xanax  to 0.25 mg bid  Behaviors are improving Continue depakote and namenda for dementia with behaviors.  Normal neuro exam  3. B12 deficiency Recheck B12 level   4. Type 2 DM On Tradgenta Recheck A1C  5. Vascular dementia See above.      Labs/tests ordered:  A1C B12 right hand xray

## 2024-06-29 ENCOUNTER — Telehealth: Payer: Self-pay | Admitting: Adult Health

## 2024-06-29 DIAGNOSIS — E1159 Type 2 diabetes mellitus with other circulatory complications: Secondary | ICD-10-CM | POA: Diagnosis not present

## 2024-06-29 LAB — HEMOGLOBIN A1C: Hemoglobin A1C: 6.9

## 2024-06-29 LAB — VITAMIN B12: Vitamin B-12: 3953

## 2024-06-29 LAB — AMB RESULTS CONSOLE CBG: Glucose: 0

## 2024-06-29 NOTE — Telephone Encounter (Signed)
 B12 level returned at 3953 Will reduce B12 supplement A1C 6.9 no changes

## 2024-07-06 ENCOUNTER — Non-Acute Institutional Stay (SKILLED_NURSING_FACILITY): Payer: Self-pay | Admitting: Adult Health

## 2024-07-06 ENCOUNTER — Encounter: Payer: Self-pay | Admitting: Adult Health

## 2024-07-06 DIAGNOSIS — F0280 Dementia in other diseases classified elsewhere without behavioral disturbance: Secondary | ICD-10-CM | POA: Diagnosis not present

## 2024-07-06 DIAGNOSIS — F0150 Vascular dementia without behavioral disturbance: Secondary | ICD-10-CM

## 2024-07-06 DIAGNOSIS — E538 Deficiency of other specified B group vitamins: Secondary | ICD-10-CM | POA: Diagnosis not present

## 2024-07-06 DIAGNOSIS — J310 Chronic rhinitis: Secondary | ICD-10-CM | POA: Insufficient documentation

## 2024-07-06 DIAGNOSIS — N1832 Chronic kidney disease, stage 3b: Secondary | ICD-10-CM | POA: Diagnosis not present

## 2024-07-06 DIAGNOSIS — I2510 Atherosclerotic heart disease of native coronary artery without angina pectoris: Secondary | ICD-10-CM

## 2024-07-06 DIAGNOSIS — E785 Hyperlipidemia, unspecified: Secondary | ICD-10-CM

## 2024-07-06 DIAGNOSIS — I10 Essential (primary) hypertension: Secondary | ICD-10-CM | POA: Diagnosis not present

## 2024-07-06 DIAGNOSIS — G309 Alzheimer's disease, unspecified: Secondary | ICD-10-CM | POA: Diagnosis not present

## 2024-07-06 DIAGNOSIS — E118 Type 2 diabetes mellitus with unspecified complications: Secondary | ICD-10-CM | POA: Diagnosis not present

## 2024-07-06 NOTE — Assessment & Plan Note (Signed)
 Progressive decline in cognition and physical function c/w the disease. Continue supportive care in the skilled environment. Behaviors have improved with some periods of agitation.

## 2024-07-06 NOTE — Assessment & Plan Note (Signed)
 Improved continue claritin and flonase 

## 2024-07-06 NOTE — Assessment & Plan Note (Signed)
 Controlled Not currently on meds.

## 2024-07-06 NOTE — Assessment & Plan Note (Signed)
 Dose reduced due to elevated b12

## 2024-07-06 NOTE — Assessment & Plan Note (Signed)
 A1C at goal Continue tradjenta 

## 2024-07-06 NOTE — Progress Notes (Signed)
 Location:  [facility] Room Number: 140 P Place of Service:  SNF [phone]) Provider:  Tawni America, NP   Patient Care Team: Charlanne Fredia CROME, MD as PCP - General (Internal Medicine) Jordan, Peter M, MD as PCP - Cardiology (Cardiology) Tat, Asberry RAMAN, DO as Attending Physician (Neurology) Jordan, Peter M, MD as Attending Physician (Cardiology) Twana Jeneal DASEN, MD (Hematology and Oncology) Rosalie Kitchens, MD as Attending Physician (Gastroenterology)  Extended Emergency Contact Information Primary Emergency Contact: Radoncic,doug Mobile Phone: [phone] Relation: Son Secondary Emergency Contact: [facility] Phone: [phone] Relation: Daughter  Code Status:  DNR Goals of care: Advanced Directive information    06/15/2024   12:35 PM  Advanced Directives  Does Patient Have a Medical Advance Directive? Yes  Type of Advance Directive Out of facility DNR (pink MOST or yellow form);Living will  Does patient want to make changes to medical advance directive? No - Patient declined  Pre-existing out of facility DNR order (yellow form or pink MOST form) Pink MOST form placed in chart (order not valid for inpatient use);Yellow form placed in chart (order not valid for inpatient use)     Chief Complaint  Patient presents with   Routine Visit    HPI:  Pt is a 89 y.o. male seen today for medical management of chronic diseases.    Resides in SNF to progressive dementia Falls frequently.  Unsteady gait with ambulation Uses WC most of the time.  Prior fall with pelvic fracture July 2025  DM 2 : A1C 6.9 on 10/25 on tradjenta   CKD: BUN 24 Cr 1.2 06/07/24  B12 3953 on 06/29/24: dose reduced of B12 supplement  HLD: LDL 42 TC 96 on statin   [facility]ights [facility] indicate a 10 lb loss in the past month.  Intake is adequate per staff Will re weigh   CAD: on asa and statin  Depression/dementia with behaviors. MMSE 16/30 03/22/24 - Previously  started on Wellbutrin  for depression but refused to take it - SSRIs avoided due to history of hyponatremia and weakness - Currently on Seroquel and Depakote started 06/08/24 - started on Namenda 06/08/24  Behaviors improved Mood is improving, still has some agitation in the evening  Chronic rhinitis: started on flonase  and reports it helped.  Past Medical History:  Diagnosis Date   Cancer (HCC) 09/10/2002   prostate   Chronic kidney disease, stage 3b (HCC)    Coronary artery disease    Depression    Diabetes mellitus type 2 in obese    Dyslipidemia    Esophageal stricture    Monoclonal paraproteinemia 03/03/2013   Palpitations    RBBB (right bundle branch block)    Past Surgical History:  Procedure Laterality Date   ANKLE SURGERY Left    APPENDECTOMY     CARDIAC CATHETERIZATION     Dr. Jordan   CATARACT EXTRACTION W/ INTRAOCULAR LENS  IMPLANT, BILATERAL     CORONARY ARTERY BYPASS GRAFT  Oct. 2008   x 5,Dr.Gerhardt   CORONARY/GRAFT ACUTE MI REVASCULARIZATION N/A 04/22/2022   Procedure: Coronary/Graft Acute MI Revascularization;  Surgeon: Jordan, Peter M, MD;  Location: [facility] INVASIVE CV LAB;  Service: Cardiovascular;  Laterality: N/A;   HERNIA REPAIR Bilateral    LEFT HEART CATH AND CORONARY ANGIOGRAPHY N/A 04/22/2022   Procedure: LEFT HEART CATH AND CORONARY ANGIOGRAPHY;  Surgeon: Jordan, Peter M, MD;  Location: [facility] INVASIVE CV LAB;  Service: Cardiovascular;  Laterality: N/A;   PROSTATECTOMY  2004   radical  right shoulder      replacement   vein strippping      Allergies  Allergen Reactions   Other Itching     Mepergan Fortis, (meprozine)  = itching (patient disputes this in 2023)    Outpatient Encounter Medications as of 07/06/2024  Medication Sig   acetaminophen  (TYLENOL ) 325 MG tablet Take 650 mg by mouth every 4 (four) hours as needed for fever or headache.   ALPRAZolam  (XANAX ) 0.5 MG tablet Take 0.5 tablets (0.25 mg total) by mouth 2 (two) times daily.    amoxicillin  (AMOXIL ) 500 MG tablet Take 4 tablets (2,000 mg total) by mouth daily as needed. Give 1 hr prior to dental procedure   aspirin  EC 81 MG tablet Take 1 tablet (81 mg total) by mouth daily. Swallow whole.   bismuth subsalicylate (PEPTO BISMOL) 262 MG chewable tablet Chew 262 mg by mouth every 8 (eight) hours as needed.   Cyanocobalamin (VITAMIN B-12 PO) Take 2,500 mcg by mouth at bedtime.   divalproex (DEPAKOTE) 125 MG DR tablet Take 1 tablet (125 mg total) by mouth 2 (two) times daily.   fenofibrate  (TRICOR ) 48 MG tablet Take 1 tablet (48 mg total) by mouth daily.   fluticasone  (FLONASE ) 50 MCG/ACT nasal spray Place 2 sprays into both nostrils daily. X 1 month the 1 spray each nare qd   haloperidol lactate (HALDOL) 5 MG/ML injection Inject 3 mg into the muscle every 8 (eight) hours as needed.   loratadine (CLARITIN) 10 MG tablet Take 10 mg by mouth daily.   meclizine  (ANTIVERT ) 12.5 MG tablet Take 1 tablet (12.5 mg total) by mouth 3 (three) times daily as needed for dizziness.   memantine (NAMENDA) 5 MG tablet Take 1 tablet (5 mg total) by mouth 2 (two) times daily.   Misc Natural Products (TURMERIC, CURCUMIN,) CAPS Take 1 capsule by mouth 3 (three) times daily as needed. Give 1 capsule by mouth as needed for Supplement Take 1 capsule 3 times daily, preferably with meals. Capsules may be opened and prepared as a tea.   Multiple Vitamins-Minerals (PRESERVISION AREDS 2) CAPS Take 1 capsule by mouth in the morning and at bedtime.   nitroGLYCERIN  (NITROSTAT ) 0.4 MG SL tablet Place 1 tablet (0.4 mg total) under the tongue every 5 (five) minutes as needed for chest pain.   omeprazole  (PRILOSEC) 20 MG capsule Take 1 capsule (20 mg total) by mouth daily.   ondansetron  (ZOFRAN ) 4 MG tablet Take 4 mg by mouth every 8 (eight) hours as needed for nausea or vomiting.   polyethylene glycol (MIRALAX  / GLYCOLAX ) 17 g packet Take 17 g by mouth as needed for mild constipation, moderate constipation or  severe constipation.   sennosides-docusate sodium  (SENOKOT-S) 8.6-50 MG tablet Take 2 tablets by mouth at bedtime.   simvastatin  (ZOCOR ) 20 MG tablet Take 20 mg by mouth at bedtime.     TRADJENTA  5 MG TABS tablet Take 5 mg by mouth daily.   ALPRAZolam  (XANAX ) 0.5 MG tablet Take 1 tablet (0.5 mg total) by mouth 2 (two) times daily as needed for anxiety. (Patient not taking: Reported on 07/06/2024)   hydrOXYzine (ATARAX) 25 MG tablet Take 25 mg by mouth as directed. Give 1 tablet by mouth as needed for Anxiety/Agitation for 14 Days BID PRN (Patient not taking: Reported on 07/06/2024)   No facility-administered encounter medications on file as of 07/06/2024.    Review of Systems  Constitutional:  Positive for activity change. Negative for appetite change, chills, diaphoresis, fatigue and  fever.  HENT:  Negative for congestion.   Respiratory:  Negative for cough, shortness of breath and wheezing.   Cardiovascular:  Negative for chest pain and leg swelling.  Gastrointestinal:  Negative for abdominal distention, abdominal pain, constipation, diarrhea, nausea and vomiting.  Genitourinary:  Negative for difficulty urinating, dysuria and urgency.  Musculoskeletal:  Positive for gait problem. Negative for back pain, myalgias and neck pain.  Skin:  Negative for rash.  Neurological:  Negative for dizziness and weakness.  Psychiatric/Behavioral:  Positive for behavioral problems and confusion.     Immunization History  Administered Date(s) Administered   Influenza, Quadrivalent, Recombinant, Inj, Pf 05/06/2018, 06/05/2019, 06/21/2020, 06/05/2021, 05/30/2023   Influenza-Unspecified 06/05/2021, 07/02/2023   Moderna Sars-Covid-2 Vaccination 09/22/2019, 10/20/2019, 07/26/2020, 06/27/2021   PNEUMOCOCCAL CONJUGATE-20 06/29/2014   Pneumococcal Conjugate-13 06/21/2013   Pneumococcal-Unspecified 08/12/2013   Td 07/29/2009, 07/29/2013   Tetanus 11/06/2023   Zoster Recombinant(Shingrix) 01/20/2013    Pertinent  Health Maintenance Due  Topic Date Due   Influenza Vaccine  04/10/2024   HEMOGLOBIN A1C  08/03/2024   FOOT EXAM  11/03/2024   OPHTHALMOLOGY EXAM  11/25/2024      04/22/2022    7:45 AM 04/22/2022   10:00 PM 04/23/2022   10:00 AM 07/23/2023    2:39 PM 05/13/2024   10:00 AM  Fall Risk  Falls in the past year?    0 1  Was there an injury with Fall?    0 1  Fall Risk Category Calculator    0 3  (RETIRED) Patient Fall Risk Level High fall risk  High fall risk  High fall risk     Patient at Risk for Falls Due to    Impaired balance/gait;Impaired mobility History of fall(s);Impaired balance/gait;Impaired mobility  Fall risk Follow up    Falls evaluation completed Falls evaluation completed;Education provided     Data saved with a previous flowsheet row definition   Functional Status Survey:    Vitals:   07/06/24 0942  BP: 139/76  Pulse: 99  Temp: (!) 97.4 F (36.3 C)  SpO2: 99%  Weight: 151 lb 9.6 oz (68.8 kg)  Height: 5' 7 (1.702 m)   Body mass index is 23.74 kg/m. Physical Exam Vitals reviewed.  Constitutional:      Appearance: Normal appearance.  Cardiovascular:     Rate and Rhythm: Normal rate and regular rhythm.     Heart sounds: No murmur heard. Pulmonary:     Effort: Pulmonary effort is normal.     Breath sounds: Normal breath sounds.  Abdominal:     General: Bowel sounds are normal. There is no distension.     Palpations: Abdomen is soft.     Tenderness: There is no abdominal tenderness.  Musculoskeletal:     Right lower leg: No edema.     Left lower leg: No edema.  Skin:    General: Skin is warm and dry.  Neurological:     General: No focal deficit present.     Mental Status: He is alert. Mental status is at baseline.  Psychiatric:        Mood and Affect: Mood normal.     Labs reviewed: Recent Labs    02/01/24 0501 02/25/24 0000 02/29/24 1948 03/19/24 0935 04/02/24 0000 05/26/24 0000  NA 134*   < > 137 137 138 138  K 3.9   < >  4.0 4.1 4.5 4.1  CL 106   < > 103 103 102 105  CO2 21*   < >  24 24 23* 19  GLUCOSE 118*  --  127* 142*  --   --   BUN 21   < > 22 29* 20 26*  CREATININE 1.31*   < > 1.39* 1.26* 1.2 1.2  CALCIUM  7.4*   < > 8.5* 8.9 9.0 9.5  MG  --   --  2.2  --   --   --    < > = values in this interval not displayed.   Recent Labs    12/18/23 1532 01/30/24 1920 02/25/24 0000 02/29/24 1948 04/02/24 0000 05/26/24 0000  AST 11* 24   < > 19 20 19   ALT 7 13   < > 10 13 10   ALKPHOS 46 37*   < > 36* 164* 66  BILITOT 0.4 0.4  --  0.6  --   --   PROT 5.9* 5.7*  --  5.5*  --   --   ALBUMIN 3.6 2.9*   < > 2.9* 3.5 3.8   < > = values in this interval not displayed.   Recent Labs    01/30/24 1920 02/01/24 0501 02/06/24 0000 02/29/24 1948 03/19/24 0935 05/26/24 0000  WBC 8.6 7.9   < > 8.2 9.2 6.7  NEUTROABS 8.0* 6.5  --  5.5  --   --   HGB 12.1* 10.8*   < > 10.3* 11.8* 12.5*  HCT 37.3* 33.4*   < > 31.6* 37.0* 37*  MCV 94.0 93.0  --  93.8 95.1  --   PLT 230 199   < > 236 237 312   < > = values in this interval not displayed.   Lab Results  Component Value Date   TSH 3.233 04/21/2022   Lab Results  Component Value Date   HGBA1C 6.9 (H) 02/01/2024   Lab Results  Component Value Date   CHOL 96 11/04/2023   HDL 44 11/04/2023   LDLCALC 42 11/04/2023   TRIG 52 11/04/2023   CHOLHDL 2.8 04/21/2022    Significant Diagnostic Results in last 30 days:  No results found.  Assessment/Plan Type 2 diabetes with complication (HCC) A1C at goal Continue tradjenta   Stage 3b chronic kidney disease (HCC) Continue to periodically monitor BMP and avoid nephrotoxic agents   Essential hypertension Controlled Not currently on meds.   Dyslipidemia Continue zocor , tricor ,   Coronary artery disease On statin and asa  Mixed Alzheimer's and vascular dementia (HCC) Progressive decline in cognition and physical function c/w the disease. Continue supportive care in the skilled environment. Behaviors  have improved with some periods of agitation.   Chronic rhinitis Improved continue claritin and flonase   B12 deficiency Dose reduced due to elevated b12

## 2024-07-06 NOTE — Assessment & Plan Note (Signed)
On statin and asa

## 2024-07-06 NOTE — Assessment & Plan Note (Signed)
 Continue zocor , tricor ,

## 2024-07-06 NOTE — Assessment & Plan Note (Signed)
Continue to periodically monitor BMP and avoid nephrotoxic agents  

## 2024-07-09 ENCOUNTER — Non-Acute Institutional Stay (SKILLED_NURSING_FACILITY): Payer: Self-pay | Admitting: Adult Health

## 2024-07-09 ENCOUNTER — Encounter: Payer: Self-pay | Admitting: Adult Health

## 2024-07-09 DIAGNOSIS — Z66 Do not resuscitate: Secondary | ICD-10-CM | POA: Diagnosis not present

## 2024-07-09 DIAGNOSIS — M7989 Other specified soft tissue disorders: Secondary | ICD-10-CM

## 2024-07-09 NOTE — Progress Notes (Signed)
 Location:  [facility] Room Number: 140-P Place of Service:  SNF [phone]) Provider:  Tawni America, NP   Patient Care Team: Charlanne Fredia CROME, MD as PCP - General (Internal Medicine) Jordan, Peter M, MD as PCP - Cardiology (Cardiology) Tat, Asberry RAMAN, DO as Attending Physician (Neurology) Jordan, Peter M, MD as Attending Physician (Cardiology) Twana Jeneal DASEN, MD (Hematology and Oncology) Rosalie Kitchens, MD as Attending Physician (Gastroenterology)  Extended Emergency Contact Information Primary Emergency Contact: Boeh,doug Mobile Phone: [phone] Relation: Son Secondary Emergency Contact: [facility] Phone: [phone] Relation: Daughter  Code Status:  DNR Goals of care: Advanced Directive information    06/15/2024   12:35 PM  Advanced Directives  Does Patient Have a Medical Advance Directive? Yes  Type of Advance Directive Out of facility DNR (pink MOST or yellow form);Living will  Does patient want to make changes to medical advance directive? No - Patient declined  Pre-existing out of facility DNR order (yellow form or pink MOST form) Pink MOST form placed in chart (order not valid for inpatient use);Yellow form placed in chart (order not valid for inpatient use)     Chief Complaint  Patient presents with   Acute Visit    Left hand swelling.     HPI:  Pt is a 89 y.o. male seen today for an acute visit for left hand swelling.   Previously he was treated with Keflex due to cellulitis of the left hand due to a skin tear a couple of weeks ago Also had some left hand swelling and keflex was extended. 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 There is no wound, no warmth, and no redness.    Past Medical History:  Diagnosis Date   Cancer (HCC) 09/10/2002   prostate   Chronic kidney disease, stage 3b (HCC)    Coronary artery  disease    Depression    Diabetes mellitus type 2 in obese    Dyslipidemia    Esophageal stricture    Monoclonal paraproteinemia 03/03/2013   Palpitations    RBBB (right bundle branch block)    Past Surgical History:  Procedure Laterality Date   ANKLE SURGERY Left    APPENDECTOMY     CARDIAC CATHETERIZATION     Dr. Jordan   CATARACT EXTRACTION W/ INTRAOCULAR LENS  IMPLANT, BILATERAL     CORONARY ARTERY BYPASS GRAFT  Oct. 2008   x 5,Dr.Gerhardt   CORONARY/GRAFT ACUTE MI REVASCULARIZATION N/A 04/22/2022   Procedure: Coronary/Graft Acute MI Revascularization;  Surgeon: Jordan, Peter M, MD;  Location: [facility] INVASIVE CV LAB;  Service: Cardiovascular;  Laterality: N/A;   HERNIA REPAIR Bilateral    LEFT HEART CATH AND CORONARY ANGIOGRAPHY N/A 04/22/2022   Procedure: LEFT HEART CATH AND CORONARY ANGIOGRAPHY;  Surgeon: Jordan, Peter M, MD;  Location: [facility] INVASIVE CV LAB;  Service: Cardiovascular;  Laterality: N/A;   PROSTATECTOMY  2004   radical   right shoulder      replacement   vein strippping      Allergies  Allergen Reactions   Other Itching     Mepergan Fortis, (meprozine)  = itching (patient disputes this in 2023)    Outpatient Encounter Medications as of 07/09/2024  Medication Sig   acetaminophen  (TYLENOL ) 325 MG tablet Take 650 mg by mouth every 4 (four) hours as needed for fever or headache.   ALPRAZolam  (XANAX ) 0.5 MG  tablet Take 0.5 tablets (0.25 mg total) by mouth 2 (two) times daily.   amoxicillin  (AMOXIL ) 500 MG tablet Take 4 tablets (2,000 mg total) by mouth daily as needed. Give 1 hr prior to dental procedure   aspirin  EC 81 MG tablet Take 1 tablet (81 mg total) by mouth daily. Swallow whole.   bismuth subsalicylate (PEPTO BISMOL) 262 MG chewable tablet Chew 262 mg by mouth every 8 (eight) hours as needed.   Cyanocobalamin (VITAMIN B-12 PO) Take 2,500 mcg by mouth at bedtime.   divalproex (DEPAKOTE) 125 MG DR tablet Take 1 tablet (125 mg total) by mouth 2 (two) times  daily.   fenofibrate  (TRICOR ) 48 MG tablet Take 1 tablet (48 mg total) by mouth daily.   fluticasone  (FLONASE ) 50 MCG/ACT nasal spray Place 2 sprays into both nostrils daily. X 1 month the 1 spray each nare qd   haloperidol lactate (HALDOL) 5 MG/ML injection Inject 3 mg into the muscle every 8 (eight) hours as needed.   loratadine (CLARITIN) 10 MG tablet Take 10 mg by mouth daily.   meclizine  (ANTIVERT ) 12.5 MG tablet Take 1 tablet (12.5 mg total) by mouth 3 (three) times daily as needed for dizziness.   memantine (NAMENDA) 5 MG tablet Take 1 tablet (5 mg total) by mouth 2 (two) times daily.   Misc Natural Products (TURMERIC, CURCUMIN,) CAPS Take 1 capsule by mouth 3 (three) times daily as needed. Give 1 capsule by mouth as needed for Supplement Take 1 capsule 3 times daily, preferably with meals. Capsules may be opened and prepared as a tea.   Multiple Vitamins-Minerals (PRESERVISION AREDS 2) CAPS Take 1 capsule by mouth in the morning and at bedtime.   nitroGLYCERIN  (NITROSTAT ) 0.4 MG SL tablet Place 1 tablet (0.4 mg total) under the tongue every 5 (five) minutes as needed for chest pain.   omeprazole  (PRILOSEC) 20 MG capsule Take 1 capsule (20 mg total) by mouth daily.   ondansetron  (ZOFRAN ) 4 MG tablet Take 4 mg by mouth every 8 (eight) hours as needed for nausea or vomiting.   polyethylene glycol (MIRALAX  / GLYCOLAX ) 17 g packet Take 17 g by mouth as needed for mild constipation, moderate constipation or severe constipation.   sennosides-docusate sodium  (SENOKOT-S) 8.6-50 MG tablet Take 2 tablets by mouth at bedtime.   simvastatin  (ZOCOR ) 20 MG tablet Take 20 mg by mouth at bedtime.     TRADJENTA  5 MG TABS tablet Take 5 mg by mouth daily.   [DISCONTINUED] Turmeric, Curcuma Longa, POWD Take by mouth. Give 1 capsule by mouth as needed for supplement. Take 1 capsule 3 times daily, preferable with meals. Capsules may be opened and prepared as tea   ALPRAZolam  (XANAX ) 0.5 MG tablet Take 1 tablet  (0.5 mg total) by mouth 2 (two) times daily as needed for anxiety. (Patient not taking: Reported on 07/09/2024)   hydrOXYzine (ATARAX) 25 MG tablet Take 25 mg by mouth as directed. Give 1 tablet by mouth as needed for Anxiety/Agitation for 14 Days BID PRN (Patient not taking: Reported on 07/09/2024)   No facility-administered encounter medications on file as of 07/09/2024.    Review of Systems  Unable to perform ROS: Dementia  Musculoskeletal:  Positive for gait problem and joint swelling. Negative for back pain, myalgias, neck pain and neck stiffness.  Psychiatric/Behavioral:  Positive for confusion (improved behaviors).     Immunization History  Administered Date(s) Administered   Influenza, Quadrivalent, Recombinant, Inj, Pf 05/06/2018, 06/05/2019, 06/21/2020, 06/05/2021, 05/30/2023   Influenza-Unspecified  06/05/2021, 07/02/2023   Moderna Sars-Covid-2 Vaccination 09/22/2019, 10/20/2019, 07/26/2020, 06/27/2021   PNEUMOCOCCAL CONJUGATE-20 06/29/2014   Pneumococcal Conjugate-13 06/21/2013   Pneumococcal-Unspecified 08/12/2013   Td 07/29/2009, 07/29/2013   Tetanus 11/06/2023   Zoster Recombinant(Shingrix) 01/20/2013   Pertinent  Health Maintenance Due  Topic Date Due   Influenza Vaccine  04/10/2024   FOOT EXAM  11/03/2024   OPHTHALMOLOGY EXAM  11/25/2024   HEMOGLOBIN A1C  12/28/2024      04/22/2022    7:45 AM 04/22/2022   10:00 PM 04/23/2022   10:00 AM 07/23/2023    2:39 PM 05/13/2024   10:00 AM  Fall Risk  Falls in the past year?    0 1  Was there an injury with Fall?    0 1  Fall Risk Category Calculator    0 3  (RETIRED) Patient Fall Risk Level High fall risk  High fall risk  High fall risk     Patient at Risk for Falls Due to    Impaired balance/gait;Impaired mobility History of fall(s);Impaired balance/gait;Impaired mobility  Fall risk Follow up    Falls evaluation completed Falls evaluation completed;Education provided     Data saved with a previous flowsheet row  definition   Functional Status Survey:    Vitals:   07/09/24 0957  BP: 139/86  Pulse: 93  Resp: 20  Temp: (!) 97.5 F (36.4 C)  SpO2: 96%  Weight: 151 lb 9.6 oz (68.8 kg)  Height: 5' 7 (1.702 m)   Body mass index is 23.74 kg/m. Physical Exam Vitals reviewed.  Constitutional:      Appearance: Normal appearance.  Musculoskeletal:        General: Swelling present. No tenderness, deformity or signs of injury.     Right hand: Normal.     Left hand: Swelling present. No deformity, lacerations, tenderness or bony tenderness. Normal range of motion. Normal strength. Normal sensation. There is no disruption of two-point discrimination. Normal capillary refill. Normal pulse.     Right lower leg: No edema.     Left lower leg: No edema.  Neurological:     Mental Status: He is alert.     Labs reviewed: Recent Labs    02/01/24 0501 02/25/24 0000 02/29/24 1948 03/19/24 0935 04/02/24 0000 05/26/24 0000  NA 134*   < > 137 137 138 138  K 3.9   < > 4.0 4.1 4.5 4.1  CL 106   < > 103 103 102 105  CO2 21*   < > 24 24 23* 19  GLUCOSE 118*  --  127* 142*  --   --   BUN 21   < > 22 29* 20 26*  CREATININE 1.31*   < > 1.39* 1.26* 1.2 1.2  CALCIUM  7.4*   < > 8.5* 8.9 9.0 9.5  MG  --   --  2.2  --   --   --    < > = values in this interval not displayed.   Recent Labs    12/18/23 1532 01/30/24 1920 02/25/24 0000 02/29/24 1948 04/02/24 0000 05/26/24 0000  AST 11* 24   < > 19 20 19   ALT 7 13   < > 10 13 10   ALKPHOS 46 37*   < > 36* 164* 66  BILITOT 0.4 0.4  --  0.6  --   --   PROT 5.9* 5.7*  --  5.5*  --   --   ALBUMIN 3.6 2.9*   < > 2.9* 3.5  3.8   < > = values in this interval not displayed.   Recent Labs    01/30/24 1920 02/01/24 0501 02/06/24 0000 02/29/24 1948 03/19/24 0935 05/26/24 0000  WBC 8.6 7.9   < > 8.2 9.2 6.7  NEUTROABS 8.0* 6.5  --  5.5  --   --   HGB 12.1* 10.8*   < > 10.3* 11.8* 12.5*  HCT 37.3* 33.4*   < > 31.6* 37.0* 37*  MCV 94.0 93.0  --  93.8  95.1  --   PLT 230 199   < > 236 237 312   < > = values in this interval not displayed.   Lab Results  Component Value Date   TSH 3.233 04/21/2022   Lab Results  Component Value Date   HGBA1C 6.9 06/29/2024   Lab Results  Component Value Date   CHOL 96 11/04/2023   HDL 44 11/04/2023   LDLCALC 42 11/04/2023   TRIG 52 11/04/2023   CHOLHDL 2.8 04/21/2022    Significant Diagnostic Results in last 30 days:  No results found.  Assessment/Plan  1. DNR (do not resuscitate) (Primary) Updated order.  - Do not attempt resuscitation (DNR)  2. Swelling of left hand Mild at the metacarpal area No warmth or redness.  No pain as if this were gout  Likely related to OA Add ice tid x 48 Monitor for increased swelling or pain

## 2024-07-12 DIAGNOSIS — R41841 Cognitive communication deficit: Secondary | ICD-10-CM | POA: Diagnosis not present

## 2024-07-12 DIAGNOSIS — R42 Dizziness and giddiness: Secondary | ICD-10-CM | POA: Diagnosis not present

## 2024-07-12 DIAGNOSIS — R531 Weakness: Secondary | ICD-10-CM | POA: Diagnosis not present

## 2024-07-12 DIAGNOSIS — Z9181 History of falling: Secondary | ICD-10-CM | POA: Diagnosis not present

## 2024-07-19 DIAGNOSIS — Z9181 History of falling: Secondary | ICD-10-CM | POA: Diagnosis not present

## 2024-07-19 DIAGNOSIS — R531 Weakness: Secondary | ICD-10-CM | POA: Diagnosis not present

## 2024-07-19 DIAGNOSIS — R41841 Cognitive communication deficit: Secondary | ICD-10-CM | POA: Diagnosis not present

## 2024-07-19 DIAGNOSIS — R42 Dizziness and giddiness: Secondary | ICD-10-CM | POA: Diagnosis not present

## 2024-07-26 DIAGNOSIS — R41841 Cognitive communication deficit: Secondary | ICD-10-CM | POA: Diagnosis not present

## 2024-07-26 DIAGNOSIS — Z9181 History of falling: Secondary | ICD-10-CM | POA: Diagnosis not present

## 2024-07-26 DIAGNOSIS — R42 Dizziness and giddiness: Secondary | ICD-10-CM | POA: Diagnosis not present

## 2024-07-26 DIAGNOSIS — R531 Weakness: Secondary | ICD-10-CM | POA: Diagnosis not present

## 2024-07-27 ENCOUNTER — Encounter: Payer: Self-pay | Admitting: Adult Health

## 2024-07-27 ENCOUNTER — Non-Acute Institutional Stay (SKILLED_NURSING_FACILITY): Payer: Self-pay | Admitting: Adult Health

## 2024-07-27 DIAGNOSIS — F0280 Dementia in other diseases classified elsewhere without behavioral disturbance: Secondary | ICD-10-CM | POA: Diagnosis not present

## 2024-07-27 DIAGNOSIS — G309 Alzheimer's disease, unspecified: Secondary | ICD-10-CM

## 2024-07-27 DIAGNOSIS — F0150 Vascular dementia without behavioral disturbance: Secondary | ICD-10-CM | POA: Diagnosis not present

## 2024-07-27 MED ORDER — MEMANTINE HCL 5 MG PO TABS: 10.0000 mg | ORAL_TABLET | Freq: Two times a day (BID) | ORAL | Status: DC

## 2024-07-27 NOTE — Progress Notes (Unsigned)
 Location:  [facility] Room Number: 140 P Place of Service:  SNF [phone]) Provider:  Tawni America, NP   Patient Care Team: Charlanne Fredia CROME, MD as PCP - General (Internal Medicine) Jordan, Peter M, MD as PCP - Cardiology (Cardiology) Tat, Asberry RAMAN, DO as Attending Physician (Neurology) Jordan, Peter M, MD as Attending Physician (Cardiology) Twana Jeneal DASEN, MD (Hematology and Oncology) Rosalie Kitchens, MD as Attending Physician (Gastroenterology)  Extended Emergency Contact Information Primary Emergency Contact: Picklesimer,doug Mobile Phone: [phone] Relation: Son Secondary Emergency Contact: [facility] Phone: [phone] Relation: Daughter  Code Status:  DNR Goals of care: Advanced Directive information    06/15/2024   12:35 PM  Advanced Directives  Does Patient Have a Medical Advance Directive? Yes  Type of Advance Directive Out of facility DNR (pink MOST or yellow form);Living will  Does patient want to make changes to medical advance directive? No - Patient declined  Pre-existing out of facility DNR order (yellow form or pink MOST form) Pink MOST form placed in chart (order not valid for inpatient use);Yellow form placed in chart (order not valid for inpatient use)     Chief Complaint  Patient presents with   Depression    HPI:  Pt is a 89 y.o. male seen today for an acute visit for feelings of depression, dementia symptoms, wandering, and paranoia.   The nurse reports that he expressed feeling sad about his current state of health. He lives in skilled care due to progressive dementia and his wife lives in [place]. He wanders over to see her. He feels someone is messing with the electrical outlet in his room and that someone is taking money from him. Appetite is good. Sleep is adequate. No issues with hitting or kicking. Needs frequent redirection. Concern for elopement. Denies self harm. Has tried seroquel with no benefit. Currently on  depakote, low dose namenda, and xanax .  It has been a balance keeping him calm but not causing sedation. He has declined wellbutrin .   Also he has some intermittent pain and sweling in his right wrist.   Left wrist xray 06/27/24 There is severe osteoarthritis of the the radioscaphoid joint capitolunate joint and CMC joint of the base of the thumb marked by joint severe space narrowing with scalloping of the radial articular surface articular Surface sclerosis marginal osteophytosis  Has used ice and tylenol  with some relief.    Past Medical History:  Diagnosis Date   Cancer (HCC) 09/10/2002   prostate   Chronic kidney disease, stage 3b (HCC)    Coronary artery disease    Depression    Diabetes mellitus type 2 in obese    Dyslipidemia    Esophageal stricture    Monoclonal paraproteinemia 03/03/2013   Palpitations    RBBB (right bundle branch block)    Past Surgical History:  Procedure Laterality Date   ANKLE SURGERY Left    APPENDECTOMY     CARDIAC CATHETERIZATION     Dr. Jordan   CATARACT EXTRACTION W/ INTRAOCULAR LENS  IMPLANT, BILATERAL     CORONARY ARTERY BYPASS GRAFT  Oct. 2008   x 5,Dr.Gerhardt   CORONARY/GRAFT ACUTE MI REVASCULARIZATION N/A 04/22/2022   Procedure: Coronary/Graft Acute MI Revascularization;  Surgeon: Jordan, Peter M, MD;  Location: [facility] INVASIVE CV LAB;  Service: Cardiovascular;  Laterality: N/A;   HERNIA REPAIR Bilateral    LEFT HEART CATH AND CORONARY ANGIOGRAPHY N/A 04/22/2022   Procedure: LEFT HEART CATH AND CORONARY ANGIOGRAPHY;  Surgeon: Jordan, Peter M,  MD;  Location: [facility] INVASIVE CV LAB;  Service: Cardiovascular;  Laterality: N/A;   PROSTATECTOMY  2004   radical   right shoulder      replacement   vein strippping      Allergies  Allergen Reactions   Other Itching     Mepergan Fortis, (meprozine)  = itching (patient disputes this in 2023)    Outpatient Encounter Medications as of 07/27/2024  Medication Sig   acetaminophen  (TYLENOL ) 325  MG tablet Take 650 mg by mouth every 4 (four) hours as needed for fever or headache.   ALPRAZolam  (XANAX ) 0.5 MG tablet Take 0.5 tablets (0.25 mg total) by mouth 2 (two) times daily.   amoxicillin  (AMOXIL ) 500 MG tablet Take 4 tablets (2,000 mg total) by mouth daily as needed. Give 1 hr prior to dental procedure   aspirin  EC 81 MG tablet Take 1 tablet (81 mg total) by mouth daily. Swallow whole.   bismuth subsalicylate (PEPTO BISMOL) 262 MG chewable tablet Chew 262 mg by mouth every 8 (eight) hours as needed.   Cyanocobalamin (VITAMIN B-12 PO) Take 2,500 mcg by mouth at bedtime.   divalproex (DEPAKOTE) 125 MG DR tablet Take 1 tablet (125 mg total) by mouth 2 (two) times daily.   fenofibrate  (TRICOR ) 48 MG tablet Take 1 tablet (48 mg total) by mouth daily.   fluticasone  (FLONASE ) 50 MCG/ACT nasal spray Place 2 sprays into both nostrils daily. X 1 month the 1 spray each nare qd   haloperidol lactate (HALDOL) 5 MG/ML injection Inject 3 mg into the muscle every 8 (eight) hours as needed.   hydrOXYzine (ATARAX) 25 MG tablet Take 25 mg by mouth as directed. Give 25 mg by mouth every 24 hours as needed for agitation for 14 Days Re-evaluate in 14 days   loratadine (CLARITIN) 10 MG tablet Take 10 mg by mouth daily.   meclizine  (ANTIVERT ) 12.5 MG tablet Take 1 tablet (12.5 mg total) by mouth 3 (three) times daily as needed for dizziness.   memantine (NAMENDA) 5 MG tablet Take 1 tablet (5 mg total) by mouth 2 (two) times daily.   Misc Natural Products (TURMERIC, CURCUMIN,) CAPS Take 1 capsule by mouth 3 (three) times daily as needed. Give 1 capsule by mouth as needed for Supplement Take 1 capsule 3 times daily, preferably with meals. Capsules may be opened and prepared as a tea.   Multiple Vitamins-Minerals (PRESERVISION AREDS 2) CAPS Take 1 capsule by mouth in the morning and at bedtime.   nitroGLYCERIN  (NITROSTAT ) 0.4 MG SL tablet Place 1 tablet (0.4 mg total) under the tongue every 5 (five) minutes as  needed for chest pain.   omeprazole  (PRILOSEC) 20 MG capsule Take 1 capsule (20 mg total) by mouth daily.   ondansetron  (ZOFRAN ) 4 MG tablet Take 4 mg by mouth every 8 (eight) hours as needed for nausea or vomiting.   polyethylene glycol (MIRALAX  / GLYCOLAX ) 17 g packet Take 17 g by mouth as needed for mild constipation, moderate constipation or severe constipation.   sennosides-docusate sodium  (SENOKOT-S) 8.6-50 MG tablet Take 2 tablets by mouth at bedtime.   simvastatin  (ZOCOR ) 20 MG tablet Take 20 mg by mouth at bedtime.     TRADJENTA  5 MG TABS tablet Take 5 mg by mouth daily.   No facility-administered encounter medications on file as of 07/27/2024.    Review of Systems  Constitutional:  Negative for activity change, appetite change, chills, diaphoresis, fatigue and fever.  HENT:  Negative for congestion.  Respiratory:  Negative for cough, shortness of breath and wheezing.   Cardiovascular:  Negative for chest pain and leg swelling.  Gastrointestinal:  Negative for abdominal distention, abdominal pain, constipation, diarrhea, nausea and vomiting.  Genitourinary:  Negative for difficulty urinating, dysuria and urgency.  Musculoskeletal:  Positive for arthralgias, gait problem and joint swelling. Negative for back pain, myalgias and neck pain.  Skin:  Negative for rash.  Neurological:  Negative for dizziness and weakness.  Psychiatric/Behavioral:  Positive for agitation, behavioral problems and confusion. Negative for hallucinations, self-injury, sleep disturbance and suicidal ideas. The patient is nervous/anxious.        Paraonia Delusions.    Immunization History  Administered Date(s) Administered   Influenza, Quadrivalent, Recombinant, Inj, Pf 05/06/2018, 06/05/2019, 06/21/2020, 06/05/2021, 05/30/2023   Influenza-Unspecified 06/05/2021, 07/02/2023   Moderna Sars-Covid-2 Vaccination 09/22/2019, 10/20/2019, 07/26/2020, 06/27/2021   PNEUMOCOCCAL CONJUGATE-20 06/29/2014    Pneumococcal Conjugate-13 06/21/2013   Pneumococcal-Unspecified 08/12/2013   Td 07/29/2009, 07/29/2013   Tetanus 11/06/2023   Zoster Recombinant(Shingrix) 01/20/2013   Pertinent  Health Maintenance Due  Topic Date Due   Influenza Vaccine  04/10/2024   FOOT EXAM  11/03/2024   OPHTHALMOLOGY EXAM  11/25/2024   HEMOGLOBIN A1C  12/28/2024      04/22/2022    7:45 AM 04/22/2022   10:00 PM 04/23/2022   10:00 AM 07/23/2023    2:39 PM 05/13/2024   10:00 AM  Fall Risk  Falls in the past year?    0 1  Was there an injury with Fall?    0 1  Fall Risk Category Calculator    0 3  (RETIRED) Patient Fall Risk Level High fall risk  High fall risk  High fall risk     Patient at Risk for Falls Due to    Impaired balance/gait;Impaired mobility History of fall(s);Impaired balance/gait;Impaired mobility  Fall risk Follow up    Falls evaluation completed Falls evaluation completed;Education provided     Data saved with a previous flowsheet row definition   Functional Status Survey:    Vitals:   07/27/24 0841  BP: (!) 165/84  Pulse: 98  Resp: 18  Temp: 97.7 F (36.5 C)  SpO2: 94%  Weight: 155 lb 3.2 oz (70.4 kg)  Height: 5' 7 (1.702 m)   Body mass index is 24.31 kg/m. Physical Exam Vitals and nursing note reviewed.  Constitutional:      Appearance: Normal appearance.  Musculoskeletal:        General: Swelling (mild swelling of the right wrist. no redness or tenderness) present. No tenderness, deformity or signs of injury.     Comments: +CMS  Skin:    General: Skin is warm and dry.  Neurological:     General: No focal deficit present.     Mental Status: He is alert. Mental status is at baseline.     Labs reviewed: Recent Labs    02/01/24 0501 02/25/24 0000 02/29/24 1948 03/19/24 0935 04/02/24 0000 05/26/24 0000  NA 134*   < > 137 137 138 138  K 3.9   < > 4.0 4.1 4.5 4.1  CL 106   < > 103 103 102 105  CO2 21*   < > 24 24 23* 19  GLUCOSE 118*  --  127* 142*  --   --   BUN  21   < > 22 29* 20 26*  CREATININE 1.31*   < > 1.39* 1.26* 1.2 1.2  CALCIUM  7.4*   < > 8.5* 8.9 9.0 9.5  MG  --   --  2.2  --   --   --    < > = values in this interval not displayed.   Recent Labs    12/18/23 1532 01/30/24 1920 02/25/24 0000 02/29/24 1948 04/02/24 0000 05/26/24 0000  AST 11* 24   < > 19 20 19   ALT 7 13   < > 10 13 10   ALKPHOS 46 37*   < > 36* 164* 66  BILITOT 0.4 0.4  --  0.6  --   --   PROT 5.9* 5.7*  --  5.5*  --   --   ALBUMIN 3.6 2.9*   < > 2.9* 3.5 3.8   < > = values in this interval not displayed.   Recent Labs    01/30/24 1920 02/01/24 0501 02/06/24 0000 02/29/24 1948 03/19/24 0935 05/26/24 0000  WBC 8.6 7.9   < > 8.2 9.2 6.7  NEUTROABS 8.0* 6.5  --  5.5  --   --   HGB 12.1* 10.8*   < > 10.3* 11.8* 12.5*  HCT 37.3* 33.4*   < > 31.6* 37.0* 37*  MCV 94.0 93.0  --  93.8 95.1  --   PLT 230 199   < > 236 237 312   < > = values in this interval not displayed.   Lab Results  Component Value Date   TSH 3.233 04/21/2022   Lab Results  Component Value Date   HGBA1C 6.9 06/29/2024   Lab Results  Component Value Date   CHOL 96 11/04/2023   HDL 44 11/04/2023   LDLCALC 42 11/04/2023   TRIG 52 11/04/2023   CHOLHDL 2.8 04/21/2022    Significant Diagnostic Results in last 30 days:  No results found.  Assessment/Plan  1. Mixed Alzheimer's and vascular dementia (HCC) (Primary)  Continues with some paranoia, feeling depressed and also agitated at times.  Needs frequent redirection, wanders to his wife apt.  Will increase Namenda to 10 mg bid over the next two weeks and monitor.   2. Right wrist swelling Obtain uric acid due to issues of swelling intermittent in both wrist No signficant bout at this time that would warrant medication change.  Continue tylenol  and ice   Addendum uric acid level normal on 07/28/24

## 2024-07-28 ENCOUNTER — Encounter: Payer: Self-pay | Admitting: Adult Health

## 2024-07-28 DIAGNOSIS — M109 Gout, unspecified: Secondary | ICD-10-CM | POA: Diagnosis not present

## 2024-08-02 DIAGNOSIS — R41841 Cognitive communication deficit: Secondary | ICD-10-CM | POA: Diagnosis not present

## 2024-08-02 DIAGNOSIS — Z9181 History of falling: Secondary | ICD-10-CM | POA: Diagnosis not present

## 2024-08-02 DIAGNOSIS — R42 Dizziness and giddiness: Secondary | ICD-10-CM | POA: Diagnosis not present

## 2024-08-02 DIAGNOSIS — R531 Weakness: Secondary | ICD-10-CM | POA: Diagnosis not present

## 2024-08-03 ENCOUNTER — Other Ambulatory Visit: Payer: Self-pay | Admitting: Internal Medicine

## 2024-08-03 MED ORDER — ALPRAZOLAM 0.5 MG PO TABS: 0.2500 mg | ORAL_TABLET | Freq: Two times a day (BID) | ORAL | 2 refills | Status: DC

## 2024-08-10 ENCOUNTER — Non-Acute Institutional Stay (SKILLED_NURSING_FACILITY): Payer: Self-pay | Admitting: Internal Medicine

## 2024-08-10 ENCOUNTER — Encounter: Payer: Self-pay | Admitting: Internal Medicine

## 2024-08-10 DIAGNOSIS — M6281 Muscle weakness (generalized): Secondary | ICD-10-CM | POA: Diagnosis not present

## 2024-08-10 DIAGNOSIS — R531 Weakness: Secondary | ICD-10-CM | POA: Diagnosis not present

## 2024-08-10 DIAGNOSIS — R42 Dizziness and giddiness: Secondary | ICD-10-CM | POA: Diagnosis not present

## 2024-08-10 DIAGNOSIS — R2689 Other abnormalities of gait and mobility: Secondary | ICD-10-CM | POA: Diagnosis not present

## 2024-08-10 DIAGNOSIS — M1380 Other specified arthritis, unspecified site: Secondary | ICD-10-CM

## 2024-08-10 DIAGNOSIS — Z9181 History of falling: Secondary | ICD-10-CM | POA: Diagnosis not present

## 2024-08-10 DIAGNOSIS — R41841 Cognitive communication deficit: Secondary | ICD-10-CM | POA: Diagnosis not present

## 2024-08-10 NOTE — Progress Notes (Unsigned)
 Location: [facility] Room Number: 140-P Place of Service:  SNF (31)  Provider:   Code Status: DNR Goals of Care:     06/15/2024   12:35 PM  Advanced Directives  Does Patient Have a Medical Advance Directive? Yes  Type of Advance Directive Out of facility DNR (pink MOST or yellow form);Living will  Does patient want to make changes to medical advance directive? No - Patient declined  Pre-existing out of facility DNR order (yellow form or pink MOST form) Pink MOST form placed in chart (order not valid for inpatient use);Yellow form placed in chart (order not valid for inpatient use)     Chief Complaint  Patient presents with   Hand Injury    Left hand swelling    HPI: Patient is a 89 y.o. male seen today for an acute visit for Left Hand Swelling  Patient lives in SNF in [place] Patient has a history of progressive dementia most likely mixed Alzheimer's and vascular dementia.   He also was having depression. Failed  Wellbutrin  as he got confused had failed SSRIs in the past due to hyponatremia and weakness.  He has been seen to have Left Hand swelling since 1017/2025 Xray severe osteoarthritis of the the radioscaphoid joint capitolunate joint and CMC joint of the base of the thumb marked by joint severe space narrowing with scalloping of the radial articular surface articular Surface sclerosis marginal osteophytosis  He was treated with Tylenol  and Ice Uric Acid Normal  He continues to have the swelling in that Hand It is red Some tender below the base of Left Thumb The hand is swollen around in Metacarpal area Fingers are some swollen It is warm and red     Past Medical History:  Diagnosis Date   Cancer (HCC) 09/10/2002   prostate   Chronic kidney disease, stage 3b (HCC)    Coronary artery disease    Depression    Diabetes mellitus type 2 in obese    Dyslipidemia    Esophageal stricture    Monoclonal paraproteinemia  03/03/2013   Palpitations    RBBB (right bundle branch block)     Past Surgical History:  Procedure Laterality Date   ANKLE SURGERY Left    APPENDECTOMY     CARDIAC CATHETERIZATION     Dr. Jordan   CATARACT EXTRACTION W/ INTRAOCULAR LENS  IMPLANT, BILATERAL     CORONARY ARTERY BYPASS GRAFT  Oct. 2008   x 5,Dr.Gerhardt   CORONARY/GRAFT ACUTE MI REVASCULARIZATION N/A 04/22/2022   Procedure: Coronary/Graft Acute MI Revascularization;  Surgeon: Jordan, Peter M, MD;  Location: [facility] INVASIVE CV LAB;  Service: Cardiovascular;  Laterality: N/A;   HERNIA REPAIR Bilateral    LEFT HEART CATH AND CORONARY ANGIOGRAPHY N/A 04/22/2022   Procedure: LEFT HEART CATH AND CORONARY ANGIOGRAPHY;  Surgeon: Jordan, Peter M, MD;  Location: [facility] INVASIVE CV LAB;  Service: Cardiovascular;  Laterality: N/A;   PROSTATECTOMY  2004   radical   right shoulder      replacement   vein strippping      Allergies  Allergen Reactions   Other Itching     Mepergan Fortis, (meprozine)  = itching (patient disputes this in 2023)    Outpatient Encounter Medications as of 08/10/2024  Medication Sig   acetaminophen  (TYLENOL ) 325 MG tablet Take 650 mg by mouth every 4 (four) hours as needed for fever or headache.   ALPRAZolam  (XANAX ) 0.5 MG tablet Take 0.5 tablets (0.25 mg total) by mouth  2 (two) times daily.   amoxicillin  (AMOXIL ) 500 MG tablet Take 4 tablets (2,000 mg total) by mouth daily as needed. Give 1 hr prior to dental procedure   aspirin  EC 81 MG tablet Take 1 tablet (81 mg total) by mouth daily. Swallow whole.   bismuth subsalicylate (PEPTO BISMOL) 262 MG chewable tablet Chew 262 mg by mouth every 8 (eight) hours as needed.   Cyanocobalamin (VITAMIN B-12 PO) Take 2,500 mcg by mouth at bedtime.   divalproex  (DEPAKOTE ) 125 MG DR tablet Take 1 tablet (125 mg total) by mouth 2 (two) times daily.   fenofibrate  (TRICOR ) 48 MG tablet Take 1 tablet (48 mg total) by mouth daily.   fluticasone  (FLONASE ) 50 MCG/ACT nasal spray  Place 2 sprays into both nostrils daily. X 1 month the 1 spray each nare qd   haloperidol lactate (HALDOL) 5 MG/ML injection Inject 3 mg into the muscle every 8 (eight) hours as needed.   loratadine (CLARITIN) 10 MG tablet Take 10 mg by mouth daily.   meclizine  (ANTIVERT ) 12.5 MG tablet Take 1 tablet (12.5 mg total) by mouth 3 (three) times daily as needed for dizziness.   memantine  (NAMENDA ) 5 MG tablet Take 2 tablets (10 mg total) by mouth 2 (two) times daily. Increase to 5 mg in the am and 10 mg in the pm for 1 week then 10 mg bid no stop date.   Misc Natural Products (TURMERIC, CURCUMIN,) CAPS Take 1 capsule by mouth 3 (three) times daily as needed. Give 1 capsule by mouth as needed for Supplement Take 1 capsule 3 times daily, preferably with meals. Capsules may be opened and prepared as a tea.   Multiple Vitamins-Minerals (PRESERVISION AREDS 2) CAPS Take 1 capsule by mouth in the morning and at bedtime.   nitroGLYCERIN  (NITROSTAT ) 0.4 MG SL tablet Place 1 tablet (0.4 mg total) under the tongue every 5 (five) minutes as needed for chest pain.   omeprazole  (PRILOSEC) 20 MG capsule Take 1 capsule (20 mg total) by mouth daily.   ondansetron  (ZOFRAN ) 4 MG tablet Take 4 mg by mouth every 8 (eight) hours as needed for nausea or vomiting.   polyethylene glycol (MIRALAX  / GLYCOLAX ) 17 g packet Take 17 g by mouth as needed for mild constipation, moderate constipation or severe constipation.   sennosides-docusate sodium  (SENOKOT-S) 8.6-50 MG tablet Take 2 tablets by mouth at bedtime.   simvastatin  (ZOCOR ) 20 MG tablet Take 20 mg by mouth at bedtime.     TRADJENTA  5 MG TABS tablet Take 5 mg by mouth daily.   hydrOXYzine (ATARAX) 25 MG tablet Take 25 mg by mouth as directed. Give 25 mg by mouth every 24 hours as needed for agitation for 14 Days Re-evaluate in 14 days   No facility-administered encounter medications on file as of 08/10/2024.    Review of Systems:  Review of Systems  Unable to perform  ROS: Dementia    Health Maintenance  Topic Date Due   Zoster Vaccines- Shingrix (2 of 2) 03/17/2013   Medicare Annual Wellness (AWV)  11/22/2023   Influenza Vaccine  04/10/2024   COVID-19 Vaccine (5 - 2025-26 season) 05/11/2024   FOOT EXAM  11/03/2024   OPHTHALMOLOGY EXAM  11/25/2024   HEMOGLOBIN A1C  12/28/2024   DTaP/Tdap/Td (4 - Tdap) 11/05/2033   Pneumococcal Vaccine: 50+ Years  Completed   Meningococcal B Vaccine  Aged Out    Physical Exam: Vitals:   08/10/24 1454  BP: 138/79  Pulse: 63  Resp: 18  Temp: 98 F (36.7 C)  SpO2: 97%  Weight: 148 lb 6.4 oz (67.3 kg)  Height: 5' 7 (1.702 m)   Body mass index is 23.24 kg/m. Physical Exam Vitals reviewed.  Constitutional:      Appearance: Normal appearance.  HENT:     Head: Normocephalic.     Nose: Nose normal.     Mouth/Throat:     Mouth: Mucous membranes are moist.  Musculoskeletal:     Comments: Left Hand Swelling present on top of the hand Redness present  Not much tender It is warm  Neurological:     General: No focal deficit present.     Mental Status: He is alert.     Labs reviewed: Basic Metabolic Panel: Recent Labs    02/01/24 0501 02/25/24 0000 02/29/24 1948 03/19/24 0935 04/02/24 0000 05/26/24 0000  NA 134*   < > 137 137 138 138  K 3.9   < > 4.0 4.1 4.5 4.1  CL 106   < > 103 103 102 105  CO2 21*   < > 24 24 23* 19  GLUCOSE 118*  --  127* 142*  --   --   BUN 21   < > 22 29* 20 26*  CREATININE 1.31*   < > 1.39* 1.26* 1.2 1.2  CALCIUM  7.4*   < > 8.5* 8.9 9.0 9.5  MG  --   --  2.2  --   --   --    < > = values in this interval not displayed.   Liver Function Tests: Recent Labs    12/18/23 1532 01/30/24 1920 02/25/24 0000 02/29/24 1948 04/02/24 0000 05/26/24 0000  AST 11* 24   < > 19 20 19   ALT 7 13   < > 10 13 10   ALKPHOS 46 37*   < > 36* 164* 66  BILITOT 0.4 0.4  --  0.6  --   --   PROT 5.9* 5.7*  --  5.5*  --   --   ALBUMIN 3.6 2.9*   < > 2.9* 3.5 3.8   < > = values in this  interval not displayed.   Recent Labs    01/30/24 1920 02/29/24 1948  LIPASE 30 40   No results for input(s): AMMONIA in the last 8760 hours. CBC: Recent Labs    01/30/24 1920 02/01/24 0501 02/06/24 0000 02/29/24 1948 03/19/24 0935 05/26/24 0000  WBC 8.6 7.9   < > 8.2 9.2 6.7  NEUTROABS 8.0* 6.5  --  5.5  --   --   HGB 12.1* 10.8*   < > 10.3* 11.8* 12.5*  HCT 37.3* 33.4*   < > 31.6* 37.0* 37*  MCV 94.0 93.0  --  93.8 95.1  --   PLT 230 199   < > 236 237 312   < > = values in this interval not displayed.   Lipid Panel: Recent Labs    11/04/23 0000  CHOL 96  HDL 44  LDLCALC 42  TRIG 52   Lab Results  Component Value Date   HGBA1C 6.9 06/29/2024    Procedures since last visit: No results found.  Assessment/Plan 1. Inflammatory arthritis of Left Hand (Primary) Prednisone 20 mg for 7 days Check CRP before doing Prednisone Uric acid was negative    Labs/tests ordered:  * No order type specified * Next appt:  Visit date not found

## 2024-08-10 NOTE — Progress Notes (Unsigned)
 Location:  [facility] Room Number: 140-P Place of Service:  SNF [phone]) Provider:  Charlanne Fredia CROME, MD  Patient Care Team: Charlanne Fredia CROME, MD as PCP - General (Internal Medicine) Jordan, Peter M, MD as PCP - Cardiology (Cardiology) Tat, Asberry RAMAN, DO as Attending Physician (Neurology) Jordan, Peter M, MD as Attending Physician (Cardiology) Twana Jeneal DASEN, MD (Hematology and Oncology) Rosalie Kitchens, MD as Attending Physician (Gastroenterology)  Extended Emergency Contact Information Primary Emergency Contact: Bratz,doug Mobile Phone: [phone] Relation: Son Secondary Emergency Contact: [facility] Phone: [phone] Relation: Daughter  Code Status:  DNR Goals of care: Advanced Directive information    08/10/2024    3:10 PM  Advanced Directives  Does Patient Have a Medical Advance Directive? Yes  Type of Advance Directive Out of facility DNR (pink MOST or yellow form);Living will  Pre-existing out of facility DNR order (yellow form or pink MOST form) Pink MOST form placed in chart (order not valid for inpatient use);Yellow form placed in chart (order not valid for inpatient use)     Chief Complaint  Patient presents with   Hand Injury    Left hand swelling    HPI:  Pt is a 89 y.o. male seen today for an acute visit for Left Hand Swelling  Live in [facility] He has been having left hand swelling since 06/26/2024 X-ray has shown severe osteoarthritis  He was treated with Tylenol  and ice.  The uric acid was normal He continues to have swelling in the hand it is red tender the hand is swollen around the metacarpal area some swelling in the fingers.  Past Medical History:  Diagnosis Date   Cancer (HCC) 09/10/2002   prostate   Chronic kidney disease, stage 3b (HCC)    Coronary artery disease    Depression    Diabetes mellitus type 2 in obese    Dyslipidemia    Esophageal stricture    Monoclonal paraproteinemia 03/03/2013    Palpitations    RBBB (right bundle branch block)    Past Surgical History:  Procedure Laterality Date   ANKLE SURGERY Left    APPENDECTOMY     CARDIAC CATHETERIZATION     Dr. Jordan   CATARACT EXTRACTION W/ INTRAOCULAR LENS  IMPLANT, BILATERAL     CORONARY ARTERY BYPASS GRAFT  Oct. 2008   x 5,Dr.Gerhardt   CORONARY/GRAFT ACUTE MI REVASCULARIZATION N/A 04/22/2022   Procedure: Coronary/Graft Acute MI Revascularization;  Surgeon: Jordan, Peter M, MD;  Location: [facility] INVASIVE CV LAB;  Service: Cardiovascular;  Laterality: N/A;   HERNIA REPAIR Bilateral    LEFT HEART CATH AND CORONARY ANGIOGRAPHY N/A 04/22/2022   Procedure: LEFT HEART CATH AND CORONARY ANGIOGRAPHY;  Surgeon: Jordan, Peter M, MD;  Location: [facility] INVASIVE CV LAB;  Service: Cardiovascular;  Laterality: N/A;   PROSTATECTOMY  2004   radical   right shoulder      replacement   vein strippping      Allergies  Allergen Reactions   Other Itching     Mepergan Fortis, (meprozine)  = itching (patient disputes this in 2023)    Outpatient Encounter Medications as of 08/10/2024  Medication Sig   acetaminophen  (TYLENOL ) 325 MG tablet Take 650 mg by mouth every 4 (four) hours as needed for fever or headache.   ALPRAZolam  (XANAX ) 0.5 MG tablet Take 0.5 tablets (0.25 mg total) by mouth 2 (two) times daily.   amoxicillin  (AMOXIL ) 500 MG tablet Take 4 tablets (2,000 mg total) by mouth daily as  needed. Give 1 hr prior to dental procedure   aspirin  EC 81 MG tablet Take 1 tablet (81 mg total) by mouth daily. Swallow whole.   bismuth subsalicylate (PEPTO BISMOL) 262 MG chewable tablet Chew 262 mg by mouth every 8 (eight) hours as needed.   Cyanocobalamin (VITAMIN B-12 PO) Take 2,500 mcg by mouth at bedtime.   divalproex  (DEPAKOTE ) 125 MG DR tablet Take 1 tablet (125 mg total) by mouth 2 (two) times daily.   fenofibrate  (TRICOR ) 48 MG tablet Take 1 tablet (48 mg total) by mouth daily.   fluticasone  (FLONASE ) 50 MCG/ACT nasal spray Place 2 sprays  into both nostrils daily. X 1 month the 1 spray each nare qd   haloperidol lactate (HALDOL) 5 MG/ML injection Inject 3 mg into the muscle every 8 (eight) hours as needed.   loratadine (CLARITIN) 10 MG tablet Take 10 mg by mouth daily.   meclizine  (ANTIVERT ) 12.5 MG tablet Take 1 tablet (12.5 mg total) by mouth 3 (three) times daily as needed for dizziness.   memantine  (NAMENDA ) 10 MG tablet Take 10 mg by mouth 2 (two) times daily.   Misc Natural Products (TURMERIC, CURCUMIN,) CAPS Take 1 capsule by mouth 3 (three) times daily as needed. Give 1 capsule by mouth as needed for Supplement Take 1 capsule 3 times daily, preferably with meals. Capsules may be opened and prepared as a tea.   Multiple Vitamins-Minerals (PRESERVISION AREDS 2) CAPS Take 1 capsule by mouth in the morning and at bedtime.   nitroGLYCERIN  (NITROSTAT ) 0.4 MG SL tablet Place 1 tablet (0.4 mg total) under the tongue every 5 (five) minutes as needed for chest pain.   omeprazole  (PRILOSEC) 20 MG capsule Take 1 capsule (20 mg total) by mouth daily.   ondansetron  (ZOFRAN ) 4 MG tablet Take 4 mg by mouth every 8 (eight) hours as needed for nausea or vomiting.   polyethylene glycol (MIRALAX  / GLYCOLAX ) 17 g packet Take 17 g by mouth as needed for mild constipation, moderate constipation or severe constipation.   sennosides-docusate sodium  (SENOKOT-S) 8.6-50 MG tablet Take 2 tablets by mouth at bedtime.   simvastatin  (ZOCOR ) 20 MG tablet Take 20 mg by mouth at bedtime.     TRADJENTA  5 MG TABS tablet Take 5 mg by mouth daily.   [DISCONTINUED] memantine  (NAMENDA ) 5 MG tablet Take 2 tablets (10 mg total) by mouth 2 (two) times daily. Increase to 5 mg in the am and 10 mg in the pm for 1 week then 10 mg bid no stop date.   hydrOXYzine (ATARAX) 25 MG tablet Take 25 mg by mouth as directed. Give 25 mg by mouth every 24 hours as needed for agitation for 14 Days Re-evaluate in 14 days (Patient not taking: Reported on 08/10/2024)   No  facility-administered encounter medications on file as of 08/10/2024.    Review of Systems  Unable to perform ROS: Dementia    Immunization History  Administered Date(s) Administered   Influenza, Quadrivalent, Recombinant, Inj, Pf 05/06/2018, 06/05/2019, 06/21/2020, 06/05/2021, 05/30/2023   Influenza-Unspecified 06/05/2021, 07/02/2023   Moderna Sars-Covid-2 Vaccination 09/22/2019, 10/20/2019, 07/26/2020, 06/27/2021   PNEUMOCOCCAL CONJUGATE-20 06/29/2014   Pneumococcal Conjugate-13 06/21/2013   Pneumococcal-Unspecified 08/12/2013   Td 07/29/2009, 07/29/2013   Tetanus 11/06/2023   Zoster Recombinant(Shingrix) 01/20/2013   Pertinent  Health Maintenance Due  Topic Date Due   Influenza Vaccine  04/10/2024   FOOT EXAM  11/03/2024   OPHTHALMOLOGY EXAM  11/25/2024   HEMOGLOBIN A1C  12/28/2024  04/22/2022    7:45 AM 04/22/2022   10:00 PM 04/23/2022   10:00 AM 07/23/2023    2:39 PM 05/13/2024   10:00 AM  Fall Risk  Falls in the past year?    0 1  Was there an injury with Fall?    0  1   Fall Risk Category Calculator    0 3  (RETIRED) Patient Fall Risk Level High fall risk  High fall risk  High fall risk     Patient at Risk for Falls Due to    Impaired balance/gait;Impaired mobility History of fall(s);Impaired balance/gait;Impaired mobility  Fall risk Follow up    Falls evaluation completed Falls evaluation completed;Education provided     Data saved with a previous flowsheet row definition   Functional Status Survey:    Vitals:   08/10/24 1454  BP: 138/79  Pulse: 63  Resp: 18  Temp: 98 F (36.7 C)  SpO2: 97%  Weight: 148 lb 6.4 oz (67.3 kg)  Height: 5' 7 (1.702 m)   Body mass index is 23.24 kg/m. Physical Exam Vitals reviewed.  Constitutional:      Appearance: Normal appearance.  HENT:     Head: Normocephalic.     Nose: Nose normal.     Mouth/Throat:     Mouth: Mucous membranes are moist.  Eyes:     Pupils: Pupils are equal, round, and reactive to light.   Musculoskeletal:     Comments: Left Hand has swelling Redness and warm on top of the hand and Tender in base of the Thumb  Neurological:     General: No focal deficit present.     Mental Status: He is alert.  Psychiatric:        Mood and Affect: Mood normal.     Labs reviewed: Recent Labs    02/01/24 0501 02/25/24 0000 02/29/24 1948 03/19/24 0935 04/02/24 0000 05/26/24 0000  NA 134*   < > 137 137 138 138  K 3.9   < > 4.0 4.1 4.5 4.1  CL 106   < > 103 103 102 105  CO2 21*   < > 24 24 23* 19  GLUCOSE 118*  --  127* 142*  --   --   BUN 21   < > 22 29* 20 26*  CREATININE 1.31*   < > 1.39* 1.26* 1.2 1.2  CALCIUM  7.4*   < > 8.5* 8.9 9.0 9.5  MG  --   --  2.2  --   --   --    < > = values in this interval not displayed.   Recent Labs    12/18/23 1532 01/30/24 1920 02/25/24 0000 02/29/24 1948 04/02/24 0000 05/26/24 0000  AST 11* 24   < > 19 20 19   ALT 7 13   < > 10 13 10   ALKPHOS 46 37*   < > 36* 164* 66  BILITOT 0.4 0.4  --  0.6  --   --   PROT 5.9* 5.7*  --  5.5*  --   --   ALBUMIN 3.6 2.9*   < > 2.9* 3.5 3.8   < > = values in this interval not displayed.   Recent Labs    01/30/24 1920 02/01/24 0501 02/06/24 0000 02/29/24 1948 03/19/24 0935 05/26/24 0000  WBC 8.6 7.9   < > 8.2 9.2 6.7  NEUTROABS 8.0* 6.5  --  5.5  --   --   HGB 12.1* 10.8*   < >  10.3* 11.8* 12.5*  HCT 37.3* 33.4*   < > 31.6* 37.0* 37*  MCV 94.0 93.0  --  93.8 95.1  --   PLT 230 199   < > 236 237 312   < > = values in this interval not displayed.   Lab Results  Component Value Date   TSH 3.233 04/21/2022   Lab Results  Component Value Date   HGBA1C 6.9 06/29/2024   Lab Results  Component Value Date   CHOL 96 11/04/2023   HDL 44 11/04/2023   LDLCALC 42 11/04/2023   TRIG 52 11/04/2023   CHOLHDL 2.8 04/21/2022    Significant Diagnostic Results in last 30 days:  No results found.  Assessment/Plan 1. Inflammatory arthritis of Left Hand (Primary) Will check CRP It came back  2.8 Will try prednisone 20 mg daily for 7 days Nurses will let me know if is not better      Family/ staff Communication:   Labs/tests ordered:

## 2024-08-11 DIAGNOSIS — I130 Hypertensive heart and chronic kidney disease with heart failure and stage 1 through stage 4 chronic kidney disease, or unspecified chronic kidney disease: Secondary | ICD-10-CM | POA: Diagnosis not present

## 2024-08-14 DIAGNOSIS — F332 Major depressive disorder, recurrent severe without psychotic features: Secondary | ICD-10-CM | POA: Diagnosis not present

## 2024-09-01 ENCOUNTER — Encounter: Payer: Self-pay | Admitting: Orthopedic Surgery

## 2024-09-01 ENCOUNTER — Non-Acute Institutional Stay (SKILLED_NURSING_FACILITY): Admitting: Orthopedic Surgery

## 2024-09-01 DIAGNOSIS — H6121 Impacted cerumen, right ear: Secondary | ICD-10-CM | POA: Diagnosis not present

## 2024-09-01 DIAGNOSIS — Z7984 Long term (current) use of oral hypoglycemic drugs: Secondary | ICD-10-CM | POA: Diagnosis not present

## 2024-09-01 DIAGNOSIS — I2510 Atherosclerotic heart disease of native coronary artery without angina pectoris: Secondary | ICD-10-CM

## 2024-09-01 DIAGNOSIS — J310 Chronic rhinitis: Secondary | ICD-10-CM

## 2024-09-01 DIAGNOSIS — K219 Gastro-esophageal reflux disease without esophagitis: Secondary | ICD-10-CM | POA: Diagnosis not present

## 2024-09-01 DIAGNOSIS — I10 Essential (primary) hypertension: Secondary | ICD-10-CM

## 2024-09-01 DIAGNOSIS — G309 Alzheimer's disease, unspecified: Secondary | ICD-10-CM | POA: Diagnosis not present

## 2024-09-01 DIAGNOSIS — E538 Deficiency of other specified B group vitamins: Secondary | ICD-10-CM

## 2024-09-01 DIAGNOSIS — F0150 Vascular dementia without behavioral disturbance: Secondary | ICD-10-CM

## 2024-09-01 DIAGNOSIS — N1832 Chronic kidney disease, stage 3b: Secondary | ICD-10-CM

## 2024-09-01 DIAGNOSIS — E1122 Type 2 diabetes mellitus with diabetic chronic kidney disease: Secondary | ICD-10-CM | POA: Diagnosis not present

## 2024-09-01 DIAGNOSIS — F419 Anxiety disorder, unspecified: Secondary | ICD-10-CM | POA: Diagnosis not present

## 2024-09-01 DIAGNOSIS — F0280 Dementia in other diseases classified elsewhere without behavioral disturbance: Secondary | ICD-10-CM

## 2024-09-01 MED ORDER — DEBROX 6.5 % OT SOLN: 5.0000 [drp] | Freq: Two times a day (BID) | OTIC | Status: AC

## 2024-09-01 NOTE — Progress Notes (Signed)
 " Location:  [facility] Room Number: 140/P Place of Service:  SNF [phone]) Provider:  Greig FORBES Cluster, NP   Charlanne Fredia CROME, MD  Patient Care Team: Charlanne Fredia CROME, MD as PCP - General (Internal Medicine) Jordan, Peter M, MD as PCP - Cardiology (Cardiology) Tat, Asberry RAMAN, DO as Attending Physician (Neurology) Jordan, Peter M, MD as Attending Physician (Cardiology) Twana Jeneal DASEN, MD (Hematology and Oncology) Rosalie Kitchens, MD as Attending Physician (Gastroenterology)  Extended Emergency Contact Information Primary Emergency Contact: Giammarco,doug Mobile Phone: [phone] Relation: Son Secondary Emergency Contact: [facility] Phone: [phone] Relation: Daughter  Code Status:  DNR Goals of care: Advanced Directive information    08/10/2024    3:10 PM  Advanced Directives  Does Patient Have a Medical Advance Directive? Yes  Type of Advance Directive Out of facility DNR (pink MOST or yellow form);Living will  Pre-existing out of facility DNR order (yellow form or pink MOST form) Pink MOST form placed in chart (order not valid for inpatient use);Yellow form placed in chart (order not valid for inpatient use)     Chief Complaint  Patient presents with   Medical Management of Chronic Issues    HPI:  Pt is a 89 y.o. male seen today for medical management of chronic diseases.    Recently moved to skilled nursing unit at [facility].PMH: CAD, NSTEMI 2023, RBBB, unstable angina, T2DM, neuropathy, prostate cancer 2004, unstable gait, pelvic fracture 03/2024.    T2DM- A1c 6.9, no hypoglycemia, sugars 120-150's, remains on Tradjenta  CKD- GFR 57 (09/28)> was 54( 07/10)> was 48 (06/21) Alzheimer's/vascular dementia- MMSE 16/30 03/16/2024, periods of agitation with staff, upset [place], ambulates with w/c, son involved with conversations about level of care, remains on Depakote , Namenda  and haldol prn HTN- BUN/creat 24/1.20 06/07/2024, not  on medication  CAD- CABG 2008, remains on aspirin  and simvastatin  Anxiety- remains on xanax  prn Chronic rhinitis- remains on Claritin and Flonase  B12 deficiency- B12 3953, remains on cyanocobalamin GERD- hgb 12.0, remains on omeprazole   Recent weights:  12/01- 148.4 lbs  11/01- 155.2 lbs  10/21- 151.6 lbs  Recent blood pressures:  12/17- 139/83  12/03- 122/66, 134/78        Past Medical History:  Diagnosis Date   Cancer (HCC) 09/10/2002   prostate   Chronic kidney disease, stage 3b (HCC)    Coronary artery disease    Depression    Diabetes mellitus type 2 in obese    Dyslipidemia    Esophageal stricture    Monoclonal paraproteinemia 03/03/2013   Palpitations    RBBB (right bundle branch block)    Past Surgical History:  Procedure Laterality Date   ANKLE SURGERY Left    APPENDECTOMY     CARDIAC CATHETERIZATION     Dr. Jordan   CATARACT EXTRACTION W/ INTRAOCULAR LENS  IMPLANT, BILATERAL     CORONARY ARTERY BYPASS GRAFT  Oct. 2008   x 5,Dr.Gerhardt   CORONARY/GRAFT ACUTE MI REVASCULARIZATION N/A 04/22/2022   Procedure: Coronary/Graft Acute MI Revascularization;  Surgeon: Jordan, Peter M, MD;  Location: [facility] INVASIVE CV LAB;  Service: Cardiovascular;  Laterality: N/A;   HERNIA REPAIR Bilateral    LEFT HEART CATH AND CORONARY ANGIOGRAPHY N/A 04/22/2022   Procedure: LEFT HEART CATH AND CORONARY ANGIOGRAPHY;  Surgeon: Jordan, Peter M, MD;  Location: [facility] INVASIVE CV LAB;  Service: Cardiovascular;  Laterality: N/A;   PROSTATECTOMY  2004   radical   right shoulder      replacement  vein strippping      Allergies[1]  Outpatient Encounter Medications as of 09/01/2024  Medication Sig   acetaminophen  (TYLENOL ) 325 MG tablet Take 650 mg by mouth every 4 (four) hours as needed for fever or headache.   ALPRAZolam  (XANAX ) 0.5 MG tablet Take 0.5 tablets (0.25 mg total) by mouth 2 (two) times daily.   amoxicillin  (AMOXIL ) 500 MG tablet Take 4 tablets (2,000 mg total) by mouth  daily as needed. Give 1 hr prior to dental procedure   aspirin  EC 81 MG tablet Take 1 tablet (81 mg total) by mouth daily. Swallow whole.   bismuth subsalicylate (PEPTO BISMOL) 262 MG chewable tablet Chew 262 mg by mouth every 8 (eight) hours as needed.   Cyanocobalamin (VITAMIN B-12 PO) Take 2,500 mcg by mouth at bedtime.   divalproex  (DEPAKOTE ) 125 MG DR tablet Take 1 tablet (125 mg total) by mouth 2 (two) times daily.   fenofibrate  (TRICOR ) 48 MG tablet Take 1 tablet (48 mg total) by mouth daily.   fluticasone  (FLONASE ) 50 MCG/ACT nasal spray Place 2 sprays into both nostrils daily. X 1 month the 1 spray each nare qd   haloperidol lactate (HALDOL) 5 MG/ML injection Inject 3 mg into the muscle every 8 (eight) hours as needed.   hydrOXYzine (ATARAX) 25 MG tablet Take 25 mg by mouth as directed. Give 25 mg by mouth every 24 hours as needed for agitation for 14 Days Re-evaluate in 14 days (Patient not taking: Reported on 08/10/2024)   loratadine (CLARITIN) 10 MG tablet Take 10 mg by mouth daily.   meclizine  (ANTIVERT ) 12.5 MG tablet Take 1 tablet (12.5 mg total) by mouth 3 (three) times daily as needed for dizziness.   memantine  (NAMENDA ) 10 MG tablet Take 10 mg by mouth 2 (two) times daily.   Misc Natural Products (TURMERIC, CURCUMIN,) CAPS Take 1 capsule by mouth 3 (three) times daily as needed. Give 1 capsule by mouth as needed for Supplement Take 1 capsule 3 times daily, preferably with meals. Capsules may be opened and prepared as a tea.   Multiple Vitamins-Minerals (PRESERVISION AREDS 2) CAPS Take 1 capsule by mouth in the morning and at bedtime.   nitroGLYCERIN  (NITROSTAT ) 0.4 MG SL tablet Place 1 tablet (0.4 mg total) under the tongue every 5 (five) minutes as needed for chest pain.   omeprazole  (PRILOSEC) 20 MG capsule Take 1 capsule (20 mg total) by mouth daily.   ondansetron  (ZOFRAN ) 4 MG tablet Take 4 mg by mouth every 8 (eight) hours as needed for nausea or vomiting.   polyethylene  glycol (MIRALAX  / GLYCOLAX ) 17 g packet Take 17 g by mouth as needed for mild constipation, moderate constipation or severe constipation.   sennosides-docusate sodium  (SENOKOT-S) 8.6-50 MG tablet Take 2 tablets by mouth at bedtime.   simvastatin  (ZOCOR ) 20 MG tablet Take 20 mg by mouth at bedtime.     TRADJENTA  5 MG TABS tablet Take 5 mg by mouth daily.   No facility-administered encounter medications on file as of 09/01/2024.    Review of Systems  Unable to perform ROS: Dementia    Immunization History  Administered Date(s) Administered   Influenza, Quadrivalent, Recombinant, Inj, Pf 05/06/2018, 06/05/2019, 06/21/2020, 06/05/2021, 05/30/2023   Influenza-Unspecified 06/05/2021, 07/02/2023   Moderna Sars-Covid-2 Vaccination 09/22/2019, 10/20/2019, 07/26/2020, 06/27/2021   PNEUMOCOCCAL CONJUGATE-20 06/29/2014   Pneumococcal Conjugate-13 06/21/2013   Pneumococcal-Unspecified 08/12/2013   Td 07/29/2009, 07/29/2013   Tetanus 11/06/2023   Zoster Recombinant(Shingrix) 01/20/2013   Pertinent  Health Maintenance Due  Topic Date Due   Influenza Vaccine  04/10/2024   FOOT EXAM  11/03/2024   OPHTHALMOLOGY EXAM  11/25/2024   HEMOGLOBIN A1C  12/28/2024      04/22/2022    7:45 AM 04/22/2022   10:00 PM 04/23/2022   10:00 AM 07/23/2023    2:39 PM 05/13/2024   10:00 AM  Fall Risk  Falls in the past year?    0 1  Was there an injury with Fall?    0  1   Fall Risk Category Calculator    0 3  (RETIRED) Patient Fall Risk Level High fall risk  High fall risk  High fall risk     Patient at Risk for Falls Due to    Impaired balance/gait;Impaired mobility History of fall(s);Impaired balance/gait;Impaired mobility  Fall risk Follow up    Falls evaluation completed Falls evaluation completed;Education provided     Data saved with a previous flowsheet row definition   Functional Status Survey:    Vitals:   09/01/24 1135  BP: 139/83  Pulse: 82  Resp: 16  Temp: 97.7 F (36.5 C)  SpO2: 97%   Weight: 148 lb 6.4 oz (67.3 kg)  Height: 5' 7 (1.702 m)   Body mass index is 23.24 kg/m. Physical Exam Vitals reviewed.  Constitutional:      General: He is not in acute distress. HENT:     Head: Normocephalic.     Right Ear: There is impacted cerumen.     Left Ear: There is no impacted cerumen.     Nose: Nose normal.     Mouth/Throat:     Mouth: Mucous membranes are moist.  Eyes:     General:        Right eye: No discharge.        Left eye: No discharge.  Cardiovascular:     Rate and Rhythm: Normal rate and regular rhythm.     Pulses: Normal pulses.     Heart sounds: Normal heart sounds.  Pulmonary:     Effort: Pulmonary effort is normal.     Breath sounds: Normal breath sounds.  Abdominal:     General: Bowel sounds are normal. There is no distension.     Palpations: Abdomen is soft.     Tenderness: There is no abdominal tenderness.  Musculoskeletal:     Cervical back: Neck supple.     Right lower leg: No edema.     Left lower leg: No edema.  Skin:    General: Skin is warm.     Capillary Refill: Capillary refill takes less than 2 seconds.  Neurological:     General: No focal deficit present.     Mental Status: He is alert. Mental status is at baseline.     Gait: Gait abnormal.  Psychiatric:        Mood and Affect: Mood normal.     Comments: Alert to self/person, follows commands     Labs reviewed: Recent Labs    02/01/24 0501 02/25/24 0000 02/29/24 1948 03/19/24 0935 04/02/24 0000 05/26/24 0000  NA 134*   < > 137 137 138 138  K 3.9   < > 4.0 4.1 4.5 4.1  CL 106   < > 103 103 102 105  CO2 21*   < > 24 24 23* 19  GLUCOSE 118*  --  127* 142*  --   --   BUN 21   < > 22 29* 20 26*  CREATININE 1.31*   < >  1.39* 1.26* 1.2 1.2  CALCIUM  7.4*   < > 8.5* 8.9 9.0 9.5  MG  --   --  2.2  --   --   --    < > = values in this interval not displayed.   Recent Labs    12/18/23 1532 01/30/24 1920 02/25/24 0000 02/29/24 1948 04/02/24 0000 05/26/24 0000  AST  11* 24   < > 19 20 19   ALT 7 13   < > 10 13 10   ALKPHOS 46 37*   < > 36* 164* 66  BILITOT 0.4 0.4  --  0.6  --   --   PROT 5.9* 5.7*  --  5.5*  --   --   ALBUMIN 3.6 2.9*   < > 2.9* 3.5 3.8   < > = values in this interval not displayed.   Recent Labs    01/30/24 1920 02/01/24 0501 02/06/24 0000 02/29/24 1948 03/19/24 0935 05/26/24 0000  WBC 8.6 7.9   < > 8.2 9.2 6.7  NEUTROABS 8.0* 6.5  --  5.5  --   --   HGB 12.1* 10.8*   < > 10.3* 11.8* 12.5*  HCT 37.3* 33.4*   < > 31.6* 37.0* 37*  MCV 94.0 93.0  --  93.8 95.1  --   PLT 230 199   < > 236 237 312   < > = values in this interval not displayed.   Lab Results  Component Value Date   TSH 3.233 04/21/2022   Lab Results  Component Value Date   HGBA1C 6.9 06/29/2024   Lab Results  Component Value Date   CHOL 96 11/04/2023   HDL 44 11/04/2023   LDLCALC 42 11/04/2023   TRIG 52 11/04/2023   CHOLHDL 2.8 04/21/2022    Significant Diagnostic Results in last 30 days:  No results found.  Assessment/Plan 1. Hearing loss of right ear due to cerumen impaction (Primary) - start debrox BID x 5 days - flush ear when debrox complete  2. Type 2 diabetes mellitus with stage 3b chronic kidney disease, without long-term current use of insulin  (HCC) - A1c 6.9 - no hypoglycemia - sugars 120-150's - make CBG's weekly instead of daily - cont Tradjenta   3. Mixed Alzheimer's and vascular dementia (HCC) - MMSE 16/30 - some agitation  - [place] move back [place]> son involved with conversations explaining level of care needed - ambulates with w/c - weight stable - cont Depakte, Namenda , Haldol prn  4. Essential hypertension - controlled without medication  5. Coronary artery disease involving native coronary artery of native heart without angina pectoris - h/o CABG 2008 - cont aspirin  and statin  6. Anxiety - cont xanax   7. Chronic rhinitis - stable with Claritin and Flonase   8. B12 deficiency - cont cyanocobalamin  9.  Gastroesophageal reflux disease without esophagitis - hgb stable - cont omeprazole     Family/ staff Communication: plan discussed with patient and nurse  Labs/tests ordered:  none      [1]  Allergies Allergen Reactions   Other Itching     Mepergan Fortis, (meprozine)  = itching (patient disputes this in 2023)   "

## 2024-09-04 ENCOUNTER — Other Ambulatory Visit: Payer: Self-pay | Admitting: Adult Health

## 2024-09-04 DIAGNOSIS — F419 Anxiety disorder, unspecified: Secondary | ICD-10-CM

## 2024-09-04 MED ORDER — ALPRAZOLAM 0.5 MG PO TABS: 0.2500 mg | ORAL_TABLET | Freq: Two times a day (BID) | ORAL | 0 refills | Status: AC

## 2024-09-21 ENCOUNTER — Encounter: Payer: Self-pay | Admitting: Internal Medicine

## 2024-09-21 ENCOUNTER — Non-Acute Institutional Stay (SKILLED_NURSING_FACILITY): Payer: Self-pay | Admitting: Internal Medicine

## 2024-09-21 DIAGNOSIS — Z7984 Long term (current) use of oral hypoglycemic drugs: Secondary | ICD-10-CM | POA: Diagnosis not present

## 2024-09-21 DIAGNOSIS — K219 Gastro-esophageal reflux disease without esophagitis: Secondary | ICD-10-CM | POA: Diagnosis not present

## 2024-09-21 DIAGNOSIS — F0150 Vascular dementia without behavioral disturbance: Secondary | ICD-10-CM | POA: Diagnosis not present

## 2024-09-21 DIAGNOSIS — G309 Alzheimer's disease, unspecified: Secondary | ICD-10-CM | POA: Diagnosis not present

## 2024-09-21 DIAGNOSIS — E538 Deficiency of other specified B group vitamins: Secondary | ICD-10-CM | POA: Diagnosis not present

## 2024-09-21 DIAGNOSIS — E1122 Type 2 diabetes mellitus with diabetic chronic kidney disease: Secondary | ICD-10-CM

## 2024-09-21 DIAGNOSIS — I2510 Atherosclerotic heart disease of native coronary artery without angina pectoris: Secondary | ICD-10-CM

## 2024-09-21 DIAGNOSIS — I10 Essential (primary) hypertension: Secondary | ICD-10-CM

## 2024-09-21 DIAGNOSIS — N1832 Chronic kidney disease, stage 3b: Secondary | ICD-10-CM | POA: Diagnosis not present

## 2024-09-21 DIAGNOSIS — F0280 Dementia in other diseases classified elsewhere without behavioral disturbance: Secondary | ICD-10-CM

## 2024-09-21 NOTE — Progress Notes (Unsigned)
 " Location:  [facility] Room Number: [place] 2 140-P Place of Service:  SNF [phone]) Provider: Charlanne Fredia CROME, MD  Patient Care Team: Charlanne Fredia CROME, MD as PCP - General (Internal Medicine) Jordan, Peter M, MD as PCP - Cardiology (Cardiology) Tat, Asberry RAMAN, DO as Attending Physician (Neurology) Jordan, Peter M, MD as Attending Physician (Cardiology) Twana Jeneal DASEN, MD (Hematology and Oncology) Rosalie Kitchens, MD as Attending Physician (Gastroenterology)  Extended Emergency Contact Information Primary Emergency Contact: Kimberlin,doug Mobile Phone: [phone] Relation: Son Secondary Emergency Contact: [facility] Phone: [phone] Relation: Daughter  Code Status:  DNR Goals of care: Advanced Directive information    08/10/2024    3:10 PM  Advanced Directives  Does Patient Have a Medical Advance Directive? Yes  Type of Advance Directive Out of facility DNR (pink MOST or yellow form);Living will  Pre-existing out of facility DNR order (yellow form or pink MOST form) Pink MOST form placed in chart (order not valid for inpatient use);Yellow form placed in chart (order not valid for inpatient use)     Chief Complaint  Patient presents with   medication management of chronic issues    Routine visit.     HPI:  Pt is a 89 y.o. male seen today for medical management of chronic diseases.    Patient lives in SN[facility] in [place] Patient has a history of progressive dementia most likely mixed Alzheimer's and vascular dementia.   He also was having depression. Failed  Wellbutrin  as he got confused had failed SSRIs in the past due to hyponatremia and weakness.  He is stable on Namenda  and Depakote  for his behaviors Per nurses he has been more depressed as one of the resident who was his friend has passed away He does talk about dying all the time But when I went to see him he seemed in good mood Denied being depressed More that he feels he has no one to  talk to  His wife lives in [place]   Wt Readings from Last 3 Encounters:  09/21/24 161 lb 12.8 oz (73.4 kg)  09/01/24 148 lb 6.4 oz (67.3 kg)  08/10/24 148 lb 6.4 oz (67.3 kg)    Has gained weight  Past Medical History:  Diagnosis Date   Cancer (HCC) 09/10/2002   prostate   Chronic kidney disease, stage 3b (HCC)    Coronary artery disease    Depression    Diabetes mellitus type 2 in obese    Dyslipidemia    Esophageal stricture    Monoclonal paraproteinemia 03/03/2013   Palpitations    RBBB (right bundle branch block)    Past Surgical History:  Procedure Laterality Date   ANKLE SURGERY Left    APPENDECTOMY     CARDIAC CATHETERIZATION     Dr. Jordan   CATARACT EXTRACTION W/ INTRAOCULAR LENS  IMPLANT, BILATERAL     CORONARY ARTERY BYPASS GRAFT  Oct. 2008   x 5,Dr.Gerhardt   CORONARY/GRAFT ACUTE MI REVASCULARIZATION N/A 04/22/2022   Procedure: Coronary/Graft Acute MI Revascularization;  Surgeon: Jordan, Peter M, MD;  Location: [facility] INVASIVE CV LAB;  Service: Cardiovascular;  Laterality: N/A;   HERNIA REPAIR Bilateral    LEFT HEART CATH AND CORONARY ANGIOGRAPHY N/A 04/22/2022   Procedure: LEFT HEART CATH AND CORONARY ANGIOGRAPHY;  Surgeon: Jordan, Peter M, MD;  Location: [facility] INVASIVE CV LAB;  Service: Cardiovascular;  Laterality: N/A;   PROSTATECTOMY  2004   radical   right shoulder      replacement  vein strippping      Allergies[1]  Outpatient Encounter Medications as of 09/21/2024  Medication Sig   acetaminophen  (TYLENOL ) 325 MG tablet Take 650 mg by mouth every 4 (four) hours as needed for fever or headache.   ALPRAZolam  (XANAX ) 0.5 MG tablet Take 0.5 tablets (0.25 mg total) by mouth 2 (two) times daily.   amoxicillin  (AMOXIL ) 500 MG tablet Take 4 tablets (2,000 mg total) by mouth daily as needed. Give 1 hr prior to dental procedure   aspirin  EC 81 MG tablet Take 1 tablet (81 mg total) by mouth daily. Swallow whole.   bismuth subsalicylate (PEPTO BISMOL) 262 MG chewable  tablet Chew 262 mg by mouth every 8 (eight) hours as needed.   carbamide peroxide (DEBROX) 6.5 % OTIC solution Place 5 drops into the right ear once. Instill 5 drops of Debrox to right ear to aid with ear wash at Audiology on 09/30/2024. Stop 01/20206.   Cyanocobalamin (VITAMIN B-12 PO) Take 2,500 mcg by mouth at bedtime.   divalproex  (DEPAKOTE ) 125 MG DR tablet Take 1 tablet (125 mg total) by mouth 2 (two) times daily.   fenofibrate  (TRICOR ) 48 MG tablet Take 1 tablet (48 mg total) by mouth daily.   fluticasone  (FLONASE ) 50 MCG/ACT nasal spray Place 2 sprays into both nostrils daily. X 1 month the 1 spray each nare qd   haloperidol lactate (HALDOL) 5 MG/ML injection Inject 3 mg into the muscle every 8 (eight) hours as needed.   loratadine (CLARITIN) 10 MG tablet Take 10 mg by mouth daily.   meclizine  (ANTIVERT ) 12.5 MG tablet Take 1 tablet (12.5 mg total) by mouth 3 (three) times daily as needed for dizziness.   memantine  (NAMENDA ) 10 MG tablet Take 10 mg by mouth 2 (two) times daily.   Misc Natural Products (TURMERIC, CURCUMIN,) CAPS Take 1 capsule by mouth 3 (three) times daily as needed. Give 1 capsule by mouth as needed for Supplement Take 1 capsule 3 times daily, preferably with meals. Capsules may be opened and prepared as a tea.   Multiple Vitamins-Minerals (PRESERVISION AREDS 2) CAPS Take 1 capsule by mouth in the morning and at bedtime.   nitroGLYCERIN  (NITROSTAT ) 0.4 MG SL tablet Place 1 tablet (0.4 mg total) under the tongue every 5 (five) minutes as needed for chest pain.   omeprazole  (PRILOSEC) 20 MG capsule Take 1 capsule (20 mg total) by mouth daily.   ondansetron  (ZOFRAN ) 4 MG tablet Take 4 mg by mouth every 8 (eight) hours as needed for nausea or vomiting.   polyethylene glycol (MIRALAX  / GLYCOLAX ) 17 g packet Take 17 g by mouth as needed for mild constipation, moderate constipation or severe constipation.   sennosides-docusate sodium  (SENOKOT-S) 8.6-50 MG tablet Take 2 tablets  by mouth at bedtime.   simvastatin  (ZOCOR ) 20 MG tablet Take 20 mg by mouth at bedtime.     TRADJENTA  5 MG TABS tablet Take 5 mg by mouth daily.   No facility-administered encounter medications on file as of 09/21/2024.    Review of Systems  Unable to perform ROS: Dementia    Immunization History  Administered Date(s) Administered   Influenza, Quadrivalent, Recombinant, Inj, Pf 05/06/2018, 06/05/2019, 06/21/2020, 06/05/2021, 05/30/2023   Influenza-Unspecified 06/05/2021, 07/02/2023   Moderna Sars-Covid-2 Vaccination 09/22/2019, 10/20/2019, 07/26/2020, 06/27/2021   PNEUMOCOCCAL CONJUGATE-20 06/29/2014   Pneumococcal Conjugate-13 06/21/2013   Pneumococcal-Unspecified 08/12/2013   Td 07/29/2009, 07/29/2013   Tetanus 11/06/2023   Zoster Recombinant(Shingrix) 01/20/2013   Pertinent  Health Maintenance Due  Topic Date  Due   Influenza Vaccine  04/10/2024   FOOT EXAM  11/03/2024   OPHTHALMOLOGY EXAM  11/25/2024   HEMOGLOBIN A1C  12/28/2024      04/22/2022   10:00 PM 04/23/2022   10:00 AM 07/23/2023    2:39 PM 05/13/2024   10:00 AM 09/01/2024   12:07 PM  Fall Risk  Falls in the past year?   0 1 1  Was there an injury with Fall?   0  1  1  Fall Risk Category Calculator   0 3 2  (RETIRED) Patient Fall Risk Level High fall risk  High fall risk      Patient at Risk for Falls Due to   Impaired balance/gait;Impaired mobility History of fall(s);Impaired balance/gait;Impaired mobility Impaired balance/gait  Fall risk Follow up   Falls evaluation completed Falls evaluation completed;Education provided Falls evaluation completed     Data saved with a previous flowsheet row definition   Functional Status Survey:    Vitals:   09/21/24 1350 09/21/24 1351  BP: (!) 151/84 (!) 150/81  Pulse: 93   SpO2: 93%   Weight: 161 lb 12.8 oz (73.4 kg)   Height: 5' 7 (1.702 m)    Body mass index is 25.34 kg/m. Physical Exam Vitals reviewed.  Constitutional:      Appearance: Normal appearance.   HENT:     Head: Normocephalic.     Nose: Nose normal.     Mouth/Throat:     Mouth: Mucous membranes are moist.     Pharynx: Oropharynx is clear.  Eyes:     Pupils: Pupils are equal, round, and reactive to light.  Cardiovascular:     Rate and Rhythm: Normal rate and regular rhythm.     Pulses: Normal pulses.     Heart sounds: No murmur heard. Pulmonary:     Effort: Pulmonary effort is normal. No respiratory distress.     Breath sounds: Normal breath sounds. No rales.  Abdominal:     General: Abdomen is flat. Bowel sounds are normal.     Palpations: Abdomen is soft.  Musculoskeletal:        General: No swelling.     Cervical back: Neck supple.  Skin:    General: Skin is warm.  Neurological:     General: No focal deficit present.     Mental Status: He is alert.  Psychiatric:        Mood and Affect: Mood normal.        Thought Content: Thought content normal.     Labs reviewed: Recent Labs    02/01/24 0501 02/25/24 0000 02/29/24 1948 03/19/24 0935 04/02/24 0000 05/26/24 0000  NA 134*   < > 137 137 138 138  K 3.9   < > 4.0 4.1 4.5 4.1  CL 106   < > 103 103 102 105  CO2 21*   < > 24 24 23* 19  GLUCOSE 118*  --  127* 142*  --   --   BUN 21   < > 22 29* 20 26*  CREATININE 1.31*   < > 1.39* 1.26* 1.2 1.2  CALCIUM  7.4*   < > 8.5* 8.9 9.0 9.5  MG  --   --  2.2  --   --   --    < > = values in this interval not displayed.   Recent Labs    12/18/23 1532 01/30/24 1920 02/25/24 0000 02/29/24 1948 04/02/24 0000 05/26/24 0000  AST 11* 24   < >  19 20 19   ALT 7 13   < > 10 13 10   ALKPHOS 46 37*   < > 36* 164* 66  BILITOT 0.4 0.4  --  0.6  --   --   PROT 5.9* 5.7*  --  5.5*  --   --   ALBUMIN 3.6 2.9*   < > 2.9* 3.5 3.8   < > = values in this interval not displayed.   Recent Labs    01/30/24 1920 02/01/24 0501 02/06/24 0000 02/29/24 1948 03/19/24 0935 05/26/24 0000  WBC 8.6 7.9   < > 8.2 9.2 6.7  NEUTROABS 8.0* 6.5  --  5.5  --   --   HGB 12.1* 10.8*   < >  10.3* 11.8* 12.5*  HCT 37.3* 33.4*   < > 31.6* 37.0* 37*  MCV 94.0 93.0  --  93.8 95.1  --   PLT 230 199   < > 236 237 312   < > = values in this interval not displayed.   Lab Results  Component Value Date   TSH 3.233 04/21/2022   Lab Results  Component Value Date   HGBA1C 6.9 06/29/2024   Lab Results  Component Value Date   CHOL 96 11/04/2023   HDL 44 11/04/2023   LDLCALC 42 11/04/2023   TRIG 52 11/04/2023   CHOLHDL 2.8 04/21/2022    Significant Diagnostic Results in last 30 days:  No results found.  Assessment/Plan 1. Type 2 diabetes mellitus with stage 3b chronic kidney disease, without long-term current use of insulin  (HCC) (Primary) On Tradjenta  CBGS are 130-150 A1C 6.9 in 06/2024  2. Mixed Alzheimer's and vascular dementia (HCC) Doing well in SNF level of care On Depakote  and Namenda  for behaviors  3. Essential hypertension No Meds will monitor Recently it has been slightly high  4. Coronary artery disease involving native coronary artery of native heart without angina pectoris On aspirin  and statin  5. B12 deficiency B 12 level was in 3000 Will discontinue B12   6. Gastroesophageal reflux disease without esophagitis Prilosec  7 Depression with Anxiety Unfortunately has not tolerated SSRI due to Hyponatremia No Wellbutrin  due to anxiety Will consider Remeron if continues with symptoms  Family/ staff Communication:  Labs/tests ordered:          [1]  Allergies Allergen Reactions   Other Itching     Mepergan Fortis, (meprozine)  = itching (patient disputes this in 2023)   "
